# Patient Record
Sex: Female | Born: 1940 | Race: White | Hispanic: No | Smoking: Former smoker
Health system: Southern US, Community
[De-identification: ages and names within clinical notes are randomized; demographics above are authoritative.]

## PROBLEM LIST (undated history)

## (undated) DIAGNOSIS — I480 Paroxysmal atrial fibrillation: Secondary | ICD-10-CM

## (undated) DIAGNOSIS — I10 Essential (primary) hypertension: Secondary | ICD-10-CM

## (undated) DIAGNOSIS — J449 Chronic obstructive pulmonary disease, unspecified: Secondary | ICD-10-CM

## (undated) DIAGNOSIS — N179 Acute kidney failure, unspecified: Secondary | ICD-10-CM

## (undated) DIAGNOSIS — I4892 Unspecified atrial flutter: Secondary | ICD-10-CM

## (undated) DIAGNOSIS — IMO0001 Reserved for inherently not codable concepts without codable children: Secondary | ICD-10-CM

## (undated) DIAGNOSIS — E119 Type 2 diabetes mellitus without complications: Secondary | ICD-10-CM

## (undated) DIAGNOSIS — E079 Disorder of thyroid, unspecified: Secondary | ICD-10-CM

## (undated) DIAGNOSIS — L309 Dermatitis, unspecified: Secondary | ICD-10-CM

## (undated) DIAGNOSIS — E785 Hyperlipidemia, unspecified: Secondary | ICD-10-CM

## (undated) DIAGNOSIS — N189 Chronic kidney disease, unspecified: Secondary | ICD-10-CM

## (undated) DIAGNOSIS — M1990 Unspecified osteoarthritis, unspecified site: Secondary | ICD-10-CM

## (undated) DIAGNOSIS — H269 Unspecified cataract: Secondary | ICD-10-CM

## (undated) DIAGNOSIS — K589 Irritable bowel syndrome without diarrhea: Secondary | ICD-10-CM

## (undated) DIAGNOSIS — H3530 Unspecified macular degeneration: Secondary | ICD-10-CM

## (undated) DIAGNOSIS — K219 Gastro-esophageal reflux disease without esophagitis: Secondary | ICD-10-CM

## (undated) DIAGNOSIS — C801 Malignant (primary) neoplasm, unspecified: Secondary | ICD-10-CM

## (undated) DIAGNOSIS — I4891 Unspecified atrial fibrillation: Secondary | ICD-10-CM

## (undated) DIAGNOSIS — Z794 Long term (current) use of insulin: Secondary | ICD-10-CM

## (undated) DIAGNOSIS — J45909 Unspecified asthma, uncomplicated: Secondary | ICD-10-CM

## (undated) DIAGNOSIS — Z5189 Encounter for other specified aftercare: Secondary | ICD-10-CM

## (undated) HISTORY — DX: Unspecified cataract: H26.9

## (undated) HISTORY — DX: Encounter for other specified aftercare: Z51.89

## (undated) HISTORY — DX: Essential (primary) hypertension: I10

## (undated) HISTORY — DX: Irritable bowel syndrome, unspecified: K58.9

## (undated) HISTORY — DX: Unspecified atrial flutter: I48.92

## (undated) HISTORY — DX: Disorder of thyroid, unspecified: E07.9

## (undated) HISTORY — DX: Long term (current) use of insulin: Z79.4

## (undated) HISTORY — DX: Paroxysmal atrial fibrillation: I48.0

## (undated) HISTORY — DX: Unspecified osteoarthritis, unspecified site: M19.90

## (undated) HISTORY — DX: Dermatitis, unspecified: L30.9

## (undated) HISTORY — DX: Unspecified atrial fibrillation: I48.91

## (undated) HISTORY — DX: Gastro-esophageal reflux disease without esophagitis: K21.9

## (undated) HISTORY — DX: Acute kidney failure, unspecified: N17.9

## (undated) HISTORY — DX: Chronic kidney disease, unspecified: N18.9

## (undated) HISTORY — DX: Hyperlipidemia, unspecified: E78.5

## (undated) HISTORY — DX: Malignant (primary) neoplasm, unspecified: C80.1

## (undated) HISTORY — DX: Type 2 diabetes mellitus without complications: E11.9

## (undated) HISTORY — DX: Unspecified macular degeneration: H35.30

## (undated) HISTORY — DX: Reserved for inherently not codable concepts without codable children: IMO0001

## (undated) HISTORY — DX: Unspecified asthma, uncomplicated: J45.909

## (undated) HISTORY — DX: Chronic obstructive pulmonary disease, unspecified: J44.9

---

## 1969-09-13 HISTORY — PX: ABDOMINAL HYSTERECTOMY: SHX81

## 1969-09-13 HISTORY — PX: APPENDECTOMY: SHX54

## 1989-05-14 HISTORY — PX: HEMORRHOID SURGERY: SHX153

## 1989-05-14 HISTORY — PX: TRIGGER FINGER RELEASE: SHX641

## 2000-05-13 ENCOUNTER — Ambulatory Visit (HOSPITAL_COMMUNITY): Admission: RE | Admit: 2000-05-13 | Discharge: 2000-05-13 | Payer: Self-pay | Admitting: General Surgery

## 2000-05-13 ENCOUNTER — Encounter (INDEPENDENT_AMBULATORY_CARE_PROVIDER_SITE_OTHER): Payer: Self-pay

## 2000-07-11 ENCOUNTER — Ambulatory Visit (HOSPITAL_COMMUNITY): Admission: RE | Admit: 2000-07-11 | Discharge: 2000-07-11 | Payer: Self-pay | Admitting: Family Medicine

## 2000-07-11 ENCOUNTER — Encounter: Payer: Self-pay | Admitting: Family Medicine

## 2000-07-18 ENCOUNTER — Encounter: Payer: Self-pay | Admitting: Family Medicine

## 2000-07-18 ENCOUNTER — Encounter: Admission: RE | Admit: 2000-07-18 | Discharge: 2000-07-18 | Payer: Self-pay | Admitting: Family Medicine

## 2000-09-13 HISTORY — PX: TOTAL HIP ARTHROPLASTY: SHX124

## 2000-09-13 HISTORY — PX: CATARACT EXTRACTION: SUR2

## 2001-08-24 ENCOUNTER — Encounter: Payer: Self-pay | Admitting: Family Medicine

## 2001-08-24 ENCOUNTER — Encounter: Admission: RE | Admit: 2001-08-24 | Discharge: 2001-08-24 | Payer: Self-pay | Admitting: Family Medicine

## 2002-06-12 ENCOUNTER — Emergency Department (HOSPITAL_COMMUNITY): Admission: EM | Admit: 2002-06-12 | Discharge: 2002-06-12 | Payer: Self-pay | Admitting: Emergency Medicine

## 2002-06-28 ENCOUNTER — Encounter: Admission: RE | Admit: 2002-06-28 | Discharge: 2002-06-28 | Payer: Self-pay | Admitting: Family Medicine

## 2002-06-28 ENCOUNTER — Encounter: Payer: Self-pay | Admitting: Family Medicine

## 2002-07-10 ENCOUNTER — Encounter: Payer: Self-pay | Admitting: Family Medicine

## 2002-07-10 ENCOUNTER — Encounter: Admission: RE | Admit: 2002-07-10 | Discharge: 2002-07-10 | Payer: Self-pay | Admitting: Family Medicine

## 2002-07-20 ENCOUNTER — Encounter: Admission: RE | Admit: 2002-07-20 | Discharge: 2002-07-20 | Payer: Self-pay | Admitting: Family Medicine

## 2002-07-20 ENCOUNTER — Encounter: Payer: Self-pay | Admitting: Family Medicine

## 2002-08-07 ENCOUNTER — Encounter: Payer: Self-pay | Admitting: Family Medicine

## 2002-08-07 ENCOUNTER — Encounter: Admission: RE | Admit: 2002-08-07 | Discharge: 2002-08-07 | Payer: Self-pay | Admitting: Family Medicine

## 2002-10-29 ENCOUNTER — Encounter: Admission: RE | Admit: 2002-10-29 | Discharge: 2002-10-29 | Payer: Self-pay | Admitting: Family Medicine

## 2002-10-29 ENCOUNTER — Encounter: Payer: Self-pay | Admitting: Family Medicine

## 2002-11-02 ENCOUNTER — Encounter: Admission: RE | Admit: 2002-11-02 | Discharge: 2002-11-02 | Payer: Self-pay | Admitting: Family Medicine

## 2002-11-02 ENCOUNTER — Encounter: Payer: Self-pay | Admitting: Family Medicine

## 2003-01-29 ENCOUNTER — Encounter: Admission: RE | Admit: 2003-01-29 | Discharge: 2003-01-29 | Payer: Self-pay | Admitting: Family Medicine

## 2003-01-29 ENCOUNTER — Encounter: Payer: Self-pay | Admitting: Family Medicine

## 2003-07-18 ENCOUNTER — Encounter: Payer: Self-pay | Admitting: Internal Medicine

## 2004-08-13 ENCOUNTER — Encounter: Admission: RE | Admit: 2004-08-13 | Discharge: 2004-08-13 | Payer: Self-pay | Admitting: Family Medicine

## 2004-08-19 ENCOUNTER — Encounter: Admission: RE | Admit: 2004-08-19 | Discharge: 2004-11-17 | Payer: Self-pay | Admitting: Family Medicine

## 2005-09-13 DIAGNOSIS — I480 Paroxysmal atrial fibrillation: Secondary | ICD-10-CM

## 2005-09-13 HISTORY — DX: Paroxysmal atrial fibrillation: I48.0

## 2006-05-13 ENCOUNTER — Encounter: Admission: RE | Admit: 2006-05-13 | Discharge: 2006-05-13 | Payer: Self-pay | Admitting: Family Medicine

## 2006-05-28 ENCOUNTER — Emergency Department (HOSPITAL_COMMUNITY): Admission: EM | Admit: 2006-05-28 | Discharge: 2006-05-28 | Payer: Self-pay | Admitting: Emergency Medicine

## 2006-05-31 ENCOUNTER — Encounter: Admission: RE | Admit: 2006-05-31 | Discharge: 2006-05-31 | Payer: Self-pay | Admitting: Family Medicine

## 2006-06-15 ENCOUNTER — Ambulatory Visit: Payer: Self-pay | Admitting: Cardiology

## 2006-06-28 ENCOUNTER — Ambulatory Visit: Payer: Self-pay

## 2006-06-28 ENCOUNTER — Encounter: Payer: Self-pay | Admitting: Cardiology

## 2006-12-01 ENCOUNTER — Encounter: Admission: RE | Admit: 2006-12-01 | Discharge: 2006-12-01 | Payer: Self-pay | Admitting: Family Medicine

## 2006-12-12 HISTORY — PX: OTHER SURGICAL HISTORY: SHX169

## 2007-01-31 ENCOUNTER — Encounter: Admission: RE | Admit: 2007-01-31 | Discharge: 2007-01-31 | Payer: Self-pay | Admitting: Family Medicine

## 2007-02-13 ENCOUNTER — Encounter: Admission: RE | Admit: 2007-02-13 | Discharge: 2007-02-13 | Payer: Self-pay | Admitting: Family Medicine

## 2007-04-10 ENCOUNTER — Encounter: Admission: RE | Admit: 2007-04-10 | Discharge: 2007-04-10 | Payer: Self-pay | Admitting: Family Medicine

## 2007-05-11 ENCOUNTER — Encounter: Admission: RE | Admit: 2007-05-11 | Discharge: 2007-05-11 | Payer: Self-pay | Admitting: Family Medicine

## 2007-09-05 ENCOUNTER — Ambulatory Visit: Payer: Self-pay | Admitting: Internal Medicine

## 2007-09-26 ENCOUNTER — Ambulatory Visit: Payer: Self-pay | Admitting: Internal Medicine

## 2008-03-26 ENCOUNTER — Encounter: Payer: Self-pay | Admitting: Internal Medicine

## 2008-05-06 ENCOUNTER — Ambulatory Visit: Payer: Self-pay | Admitting: Internal Medicine

## 2008-05-06 DIAGNOSIS — R197 Diarrhea, unspecified: Secondary | ICD-10-CM | POA: Insufficient documentation

## 2008-05-06 DIAGNOSIS — K219 Gastro-esophageal reflux disease without esophagitis: Secondary | ICD-10-CM | POA: Insufficient documentation

## 2008-05-13 ENCOUNTER — Encounter: Admission: RE | Admit: 2008-05-13 | Discharge: 2008-05-13 | Payer: Self-pay | Admitting: Internal Medicine

## 2008-05-14 ENCOUNTER — Telehealth: Payer: Self-pay | Admitting: Internal Medicine

## 2008-05-15 ENCOUNTER — Ambulatory Visit (HOSPITAL_BASED_OUTPATIENT_CLINIC_OR_DEPARTMENT_OTHER): Admission: RE | Admit: 2008-05-15 | Discharge: 2008-05-15 | Payer: Self-pay | Admitting: *Deleted

## 2008-05-28 ENCOUNTER — Telehealth: Payer: Self-pay | Admitting: Internal Medicine

## 2008-05-31 ENCOUNTER — Encounter: Payer: Self-pay | Admitting: Physician Assistant

## 2008-05-31 ENCOUNTER — Ambulatory Visit: Payer: Self-pay | Admitting: Internal Medicine

## 2008-05-31 DIAGNOSIS — K5730 Diverticulosis of large intestine without perforation or abscess without bleeding: Secondary | ICD-10-CM | POA: Insufficient documentation

## 2008-05-31 LAB — CONVERTED CEMR LAB
Sed Rate: 29 mm/hr — ABNORMAL HIGH (ref 0–22)
Tissue Transglutaminase Ab, IgA: 0.3 units (ref <7)

## 2008-06-05 ENCOUNTER — Telehealth: Payer: Self-pay | Admitting: Internal Medicine

## 2008-06-17 ENCOUNTER — Telehealth: Payer: Self-pay | Admitting: Internal Medicine

## 2008-06-17 ENCOUNTER — Ambulatory Visit: Payer: Self-pay | Admitting: Internal Medicine

## 2008-06-17 DIAGNOSIS — R1013 Epigastric pain: Secondary | ICD-10-CM

## 2008-06-17 DIAGNOSIS — Z8719 Personal history of other diseases of the digestive system: Secondary | ICD-10-CM | POA: Insufficient documentation

## 2008-06-17 DIAGNOSIS — K589 Irritable bowel syndrome without diarrhea: Secondary | ICD-10-CM | POA: Insufficient documentation

## 2008-06-26 ENCOUNTER — Ambulatory Visit (HOSPITAL_COMMUNITY): Admission: RE | Admit: 2008-06-26 | Discharge: 2008-06-26 | Payer: Self-pay | Admitting: Internal Medicine

## 2008-06-28 ENCOUNTER — Telehealth: Payer: Self-pay | Admitting: Internal Medicine

## 2009-02-13 ENCOUNTER — Encounter: Admission: RE | Admit: 2009-02-13 | Discharge: 2009-02-13 | Payer: Self-pay | Admitting: Gastroenterology

## 2009-07-28 ENCOUNTER — Inpatient Hospital Stay (HOSPITAL_COMMUNITY): Admission: RE | Admit: 2009-07-28 | Discharge: 2009-07-30 | Payer: Self-pay | Admitting: Orthopedic Surgery

## 2009-08-01 ENCOUNTER — Emergency Department (HOSPITAL_COMMUNITY): Admission: EM | Admit: 2009-08-01 | Discharge: 2009-08-02 | Payer: Self-pay | Admitting: Emergency Medicine

## 2009-09-13 HISTORY — PX: CHOLECYSTECTOMY, LAPAROSCOPIC: SHX56

## 2009-09-30 ENCOUNTER — Encounter: Admission: RE | Admit: 2009-09-30 | Discharge: 2009-09-30 | Payer: Self-pay | Admitting: Internal Medicine

## 2009-11-10 ENCOUNTER — Encounter: Admission: RE | Admit: 2009-11-10 | Discharge: 2009-11-10 | Payer: Self-pay | Admitting: Internal Medicine

## 2010-03-30 ENCOUNTER — Ambulatory Visit (HOSPITAL_COMMUNITY): Admission: RE | Admit: 2010-03-30 | Discharge: 2010-03-30 | Payer: Self-pay | Admitting: Surgery

## 2010-09-13 HISTORY — PX: TOTAL HIP ARTHROPLASTY: SHX124

## 2010-10-13 NOTE — Progress Notes (Signed)
Summary: Surgical Referral   Phone Note Outgoing Call Call back at Work Phone [phone]   Call placed by: Teresita Madura CMA,  June 28, 2008 10:33 AM Call placed to: Patient Summary of Call: Left message on pt voice mail @ work to call office. Pt needs to have gallbladder removed, see if she has GSu, if not call Sean @ [facility] [phone].  Follow-up for Phone Call        Scheduled [facility] appt Dr. Manus Rudd Tues 07-16-08 @ 9:50, arrive 9:20.  Teresita Madura CMA  June 28, 2008 10:45 AM  Additional Follow-up for Phone Call Additional follow up Details #1::        Spoke with patient, she declines surgical referral.  She states she knew she has gallstones and that is not her problem.  She wants a second opinion.  Advised her that the gallbladder is also diseased and may be the cause of her pain.  She instructed me to cx appt with [facility] and she wants her primary care MD to review [place] report.  Biscomed to Dr Concepcion Elk.  Will Cx appt with [facility]. Additional Follow-up by: Darcey Nora RN,  July 01, 2008 8:33 AM    Additional Follow-up for Phone Call Additional follow up Details #2::    Let her know I think it is one of her problems but not ll. Happy to see her in office to review if desired. Follow-up by: Iva Boop MD,  July 01, 2008 10:04 AM  Additional Follow-up for Phone Call Additional follow up Details #3:: Details for Additional Follow-up Action Taken: pt notified, she will follow up as needed Additional Follow-up by: Darcey Nora RN,  July 01, 2008 10:05 AM

## 2010-10-13 NOTE — Consult Note (Signed)
Summary: Jeanette Mcintosh & screening for colon CA/Edwin Avbuere,MD  Jeanette Mcintosh & screening for colon CA/Edwin Avbuere,MD   Imported By: Lester [place] 05/22/2008 10:04:35  _____________________________________________________________________  External Attachment:    Type:   Image     Comment:   External Document

## 2010-10-13 NOTE — Progress Notes (Signed)
Summary: RX NOT WORKING/.//NEEDS SAMPLES    Phone Note Call from Patient Call back at Home Phone [phone]   Caller: Patient Call For: [name] Reason for Call: Talk to Nurse Summary of Call: NEEDS PRIOR AUTH ON HER XIFAXAN WOULD LIKE SOME SAMPLES...PROTONIX NOT WORKING FOR HER STATES THAT SHE HAS DIARRHEA AGAIN Initial call taken by: Tawni Levy,  June 05, 2008 3:19 PM  Follow-up for Phone Call        pt unable to afford the rest of her xifaxan perscription, she took the samples that were provided, but doesn't feel it helped.  Having diarrhea off and on while on the protonix declines alternate PPI for now.  She states she will keep REV with Dr Leone Payor in 2 weeks, she will continue on her imodium as she says it helps.  She is to call back if wants alternate PPI untill appt with Dr Leone Payor. Follow-up by: Darcey Nora RN,  June 05, 2008 4:09 PM

## 2010-10-13 NOTE — Procedures (Signed)
Summary: Colonoscopy   Colonoscopy  Procedure date:  07/18/2003  Findings:      Results: Hemorrhoids.     Results: Diverticulosis.       Location:  [facility].   Patient Name: Jeanette Mcintosh, Jeanette Mcintosh MRN:  Procedure Procedures: Colonoscopy CPT: [phone].  Personnel: Endoscopist: Iva Boop, MD, [facility].  Referred By: Mosetta Putt, M.D.  Exam Location: Exam performed in Outpatient Clinic. Outpatient  Patient Consent: Procedure, Alternatives, Risks and Benefits discussed, consent obtained, from patient. Consent was obtained by the RN.  Indications  Average Risk Screening Routine.  History  Current Medications: Patient is not currently taking Coumadin.  Pre-Exam Physical: Performed Jul 18, 2003. Cardio-pulmonary exam, Rectal exam, HEENT exam , Abdominal exam, Mental status exam WNL.  Exam Exam: Extent of exam reached: Cecum, extent intended: Cecum.  The cecum was identified by appendiceal orifice and IC valve. Patient position: on left side. Colon retroflexion performed. Images taken. ASA Classification: I. Tolerance: fair, adequate exam.  Monitoring: Pulse and BP monitoring, Oximetry used. Supplemental O2 given.  Colon Prep Used Golytely for colon prep. Prep results: excellent.  Sedation Meds: Patient assessed and found to be appropriate for moderate (conscious) sedation. Sedation was managed by the Endoscopist. Fentanyl 100 mcg. given IV. Versed 10 mg. given IV.  Findings - NORMAL EXAM: Cecum to Descending Colon.  DIVERTICULOSIS: Sigmoid Colon. Not bleeding. ICD9: Diverticulosis, Colon: 562.10. Comments: moderate, with angulation.  HEMORRHOIDS: External. Size: Grade II. Not bleeding. ICD9: Hemorrhoids, External: 455.3.   Assessment Abnormal examination, see findings above.  Diagnoses: 562.10: Diverticulosis, Colon.  455.3: Hemorrhoids, External.   Comments: No polyps seen.  Events  Unplanned Interventions: No intervention was required.   Plans Patient Education: Patient given standard instructions for: Hemorrhoids. Constipation. Disposition: After procedure patient sent to recovery. After recovery patient sent home.  Scheduling/Referral: Primary Care Provider, to Mosetta Putt, M.D., as planned and to coordinate future colon cancer screening.,   CC:   Mosetta Putt, MD  This report was created from the original endoscopy report, which was reviewed and signed by the above listed endoscopist.

## 2010-10-13 NOTE — Progress Notes (Signed)
Summary: TRIAGE   Phone Note Call from Patient Call back at Home Phone [phone] Call back at c# [phone]   Caller: Patient Call For: GESSNER Reason for Call: Talk to Nurse Details for Reason: TRIAGE Summary of Call: meds have been changed having bad heartburn Initial call taken by: Guadlupe [facility],  May 28, 2008 11:14 AM  Follow-up for Phone Call        pt using zantac not helping at all , having diarrhea even with the zantac,  no relief having to use TUMS.  States has lost 8 lbs.  Please advise.    Follow-up by: Darcey Nora RN,  May 28, 2008 11:31 AM  Additional Follow-up for Phone Call Additional follow up Details #1::        needs diarrhea eval would have her see an extender re: this not convinced diarrhea from PPI Additional Follow-up by: Iva Boop MD,  May 28, 2008 4:44 PM    Additional Follow-up for Phone Call Additional follow up Details #2::    pt given appt for 05-31-08 1:30 with Amy Esterwood. Follow-up by: Darcey Nora RN,  May 29, 2008 9:33 AM

## 2010-10-13 NOTE — Progress Notes (Signed)
Summary: rx called in    Phone Note Call from Patient Call back at Home Phone [phone]   Caller: Patient Call For: gessner Reason for Call: Talk to Nurse Summary of Call: pt want to let nurse know that she needs rx on protonix called in thru [facility] pharmacy for three months supply  Initial call taken by: Tawni Levy,  June 17, 2008 12:44 PM  Follow-up for Phone Call        Rx was sent to pts pharmacy. Pt notified.  Follow-up by: Christie Nottingham CMA,  June 17, 2008 1:36 PM      Prescriptions: PROTONIX 40 MG TBEC (PANTOPRAZOLE SODIUM) one tablet by mouth once daily  #90 x 3   Entered by:   Christie Nottingham CMA   Authorized by:   Iva Boop MD   Signed by:   Christie Nottingham CMA on 06/17/2008   Method used:   Printed then faxed to ...       [facility] (mail-order)       [address]       [place], [place]  [address]       Ph: [phone]       Fax: [phone]   RxID:   [id number]   Appended Document: rx called in  Samples of protonix that were put at front desk for patient pick up have been added back to sample closet. Patient never came to pick these up.

## 2010-10-13 NOTE — Assessment & Plan Note (Signed)
Summary: diarrhea / Jeanette Mcintosh    History of Present Illness Visit Type: follow up Primary GI MD: Jeanette Head MD [facility] Primary Provider: Fleet Contras, MD Requesting Provider: Milford Cage, MD Chief Complaint: diarrhea x 6 months  History of Present Illness:   70 YO WF KNOWN TO DR Leone Payor WHO HAS BEEN SEEN FOR GERD AND DIARRHEA  RECENTLY. IT WAS FELT HER DIARRHEA WAS MED INDUCED PROBABLY PPI ASSOCIATED. SXS WORSE WITH ZEGERID  AND PRILOSEC. BOTH OF THESE MEDS DO CONTROL HER REFLUX HOWEVER. NOW ON ZANTAC 300 two times a day FOR THE PAST SEVERAL WEEKS AND SAYS THE REFLUX IS HORRIBLE AND THAT SHE IS STILL HAVING DIARRHEA, SOME DAYS MUCH WORSE THAN OTHERS. SHE HAS URGENCY AND SOFT TO VERY LOOSE STOOLS,IMMODIUM HELPS BUT THEN SHE GETS CONSTIPATED!Marland KitchenDENIES PAIN ,SOME GAS AND BLOATING. HER LAST COLONOSCOPY WAS IN 2004,DIVERTICULOSIS.   GI Review of Systems    Reports abdominal pain, acid reflux, and  heartburn.     Location of  Abdominal pain: lower abdomen.    Denies belching, bloating, chest pain, dysphagia with liquids, dysphagia with solids, loss of appetite, nausea, vomiting, vomiting blood, weight loss, and  weight gain.      Reports diarrhea and  rectal bleeding.     Denies anal fissure, black tarry stools, change in bowel habit, constipation, diverticulosis, fecal incontinence, heme positive stool, hemorrhoids, irritable bowel syndrome, jaundice, light color stool, liver problems, and  rectal pain.     Prior Medications Reviewed Using: List Brought by Patient  Updated Prior Medication List: BUFFERED ASPIRIN 325 MG  TABS (ASPIRIN BUF(CACARB-MGCARB-MGO)) Take 1 tablet by mouth once a day B COMPLEX   TABS (B COMPLEX VITAMINS) Take 1 tablet by mouth once a day VITAMIN B-12 1000 MCG  TABS (CYANOCOBALAMIN) Take 1 tablet by mouth once a day CLONAZEPAM 1 MG  TABS (CLONAZEPAM) Take 1 tab by mouth at bedtime COZAAR 100 MG  TABS (LOSARTAN POTASSIUM) Take 1 tablet by mouth once a day VITAMIN D 1000  UNIT  TABS (CHOLECALCIFEROL) Take 1 tablet by mouth once a day FLEXERIL 10 MG  TABS (CYCLOBENZAPRINE HCL) Take 1 tablet by mouth once a day FOLIC ACID 400 MCG  TABS (FOLIC ACID) Take 1 tablet by mouth once a day GABAPENTIN 600 MG TABS (GABAPENTIN) Take 1 1/2 tab daily MELOXICAM 7.5 MG  TABS (MELOXICAM) Take 1 tab by mouth at bedtime TOPROL XL 100 MG  XR24H-TAB (METOPROLOL SUCCINATE) Take 1 tablet by mouth once a day ZANTAC 300 MG TABS (RANITIDINE HCL) Take 1 tablet by mouth two times a day AFRIN NASAL SPRAY 0.05 %  SOLN (OXYMETAZOLINE HCL) 2 squirts at bedtime as needed HYDROCODONE-ACETAMINOPHEN 5-500 MG  TABS (HYDROCODONE-ACETAMINOPHEN) Take 1 tab by mouth at bedtime as needed RUTIN 500 MG  TABS (RUTIN) Take 1 tablet by mouth once a day SYNTHROID 137 MCG  TABS (LEVOTHYROXINE SODIUM) Take 1 tablet by mouth once a day ZOLPIDEM TARTRATE 10 MG  TABS (ZOLPIDEM TARTRATE) Take 1 tab by mouth at bedtime CHLORTHALIDONE 25 MG TABS (CHLORTHALIDONE) Take 1/2 every am PRAVASTATIN SODIUM 10 MG TABS (PRAVASTATIN SODIUM) at bedtime DILTIAZEM HCL CR 120 MG XR12H-CAP (DILTIAZEM HCL) 1 once daily LANTUS 100 UNIT/ML SOLN (INSULIN GLARGINE) 30 cc 1 once daily ZYRTEC ALLERGY 10 MG TABS (CETIRIZINE HCL) 1 once daily IMODIUM A-D 2 MG TABS (LOPERAMIDE HCL) as needed  Current Allergies (reviewed today): ERYTHROMYCIN * IVP DYE  Past Medical History:    Reviewed history from 04/19/2008 and no changes required:  Cervical Cancer 1970       Macular Degeneration, dry       OsteoArthritis       History of Atrial Fibrillation       Diabetes Type 2       Diverticulosis Sigmoid       Gallstones       Hypertension       Hypothyroidism  Past Surgical History:    Reviewed history from 04/19/2008 and no changes required:       Appendectomy       Hemorrhoidectomy       Hip Replacement       Hysterectomy   Family History:    Reviewed history from 05/06/2008 and no changes required:       Parkinson's  disease: Father       Suicide: Mother       Macular degeration: Siblings  Social History:    Reviewed history from 05/06/2008 and no changes required:       Occupation: Retired       Patient is a former smoker.        Alcohol Use - yes - 1 daily       Illicit Drug Use - no       Patient does not get regular exercise.     Review of Systems       The patient complains of urine leakage.         SHE HAS PERIPHERALNEUROPATHY,OTHERWISE ROS NEGATIVE EXCEPT GI AS ABOVE.   Vital Signs:  Patient Profile:   70 Years Old Female Height:     64 inches Weight:      168.50 pounds BMI:     29.03 Pulse rate:   72 / minute Pulse rhythm:   regular BP sitting:   112 / 64  (left arm)  Vitals Entered By: Chales Abrahams CMA (May 31, 2008 1:50 PM)                  Physical Exam  General:     Well developed, well nourished, no acute distress. Mcintosh:     Normocephalic and atraumatic. Neck:     Supple; no masses or thyromegaly. Lungs:     Clear throughout to auscultation. Heart:     Regular rate and rhythm; no murmurs, rubs,  or bruits. Abdomen:     Soft, nontender and nondistended. No masses, hepatosplenomegaly or hernias noted. Normal bowel sounds. Rectal:     not done    Impression & Recommendations:  Problem # 1:  DIARRHEA (ICD-787.91) PERSISTENT DIARRHEA,POSSIBLY MED RELATED VS MULTIFACTORIAL ,R/OBACTERIAL OVERGROWTH COMPONENT,DIABETIC VISCERAL NEUROPATHY,MICROSCOPIC COLITIS,CELIAC DISEASE Orders: TLB-Sedimentation Rate (ESR) ([id number]-[id number]) T-Sprue Panel (Celiac Disease Aby Eval) ([id number]) TRIAL OF PROTONIX 40 MG DAILY,SAMPLES GIVEN, IF THIS WORSENS DIARRHEA WOULD CONTINUE TRIALS OF OTHER PPI'S. CONTINUE DAILY PROBIOTIC/ACIDOPHILLUS XIFAXIN 200 MG 3X DAILY X 10 DAYS FOLLOW UP WITH DR Leone Payor IN 2-3 WEEKS,IF NO IMPROVEMENT WOULD REPEAT COLONOSCOPY/RANDOM BXS.   Problem # 2:  GERD (ICD-530.81) SEE ABOVE PLANS   Patient Instructions: 1)  Get labs drawn today.    2)  Pick up Rx's at your pharmacy. 3)  Call back in a couple of weeks for more Protonix samples. 4)  F/u appt with Dr. Leone Payor in 2 wks. 5)  Copy Sent To: Stevenson Clinch, MD    Prescriptions: PROTONIX 40 MG TBEC (PANTOPRAZOLE SODIUM) one tablet by mouth once daily  #30 x 11   Entered by:   Christie Nottingham CMA  Authorized by:   Sammuel Cooper PA-c   Signed by:   Christie Nottingham CMA on 05/31/2008   Method used:   [facility]ron[facility] to        [facility]  [address] [phone]* (retail)       [address]       [place], [place]  [address]       Ph: [phone] or [phone]       Fax: [phone]   RxID:   [id number] XIFAXAN 200 MG TABS (RIFAXIMIN) one tablet by mouth three times a day  #18 x 0   Entered by:   Christie Nottingham CMA   Authorized by:   Sammuel Cooper PA-c   Signed by:   Christie Nottingham CMA on 05/31/2008   Method used:   [facility]        [facility]  [address] [phone]* (retail)       [address]       [place], [place]  [address]       Ph: [phone] or [phone]       Fax: [phone]   RxID:   [id number]  ]

## 2010-10-13 NOTE — Progress Notes (Signed)
Summary: meds not working  Phone Note Call from Patient Call back at [employer] [phone]   Caller: Patient Call For: gessner Reason for Call: Talk to Nurse Summary of Call: pt states that dr switched her meds from prilosec to zantac b/c prilosec was giving her diarrhea but she states that zantac helps her diarrhea but does nothing for her stomach Initial call taken by: Tawni Levy,  May 14, 2008 10:41 AM  Follow-up for Phone Call        RC to pt at home number.  this number is blocked.  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 Follow-up by: Francee Piccolo CMA,  May 15, 2008 10:35 AM  Additional Follow-up for Phone Call Additional follow up Details #1::        Pt had OV with Amy Esterwood on 05/31/08.  Ending follow up. Additional Follow-up by: Francee Piccolo CMA,  June 04, 2008 9:42 AM

## 2010-10-13 NOTE — Assessment & Plan Note (Signed)
Summary: GERD/FH   History of Present Illness Visit Type: consult Primary GI MD: Stan Head MD [facility] Primary Provider: Fleet Contras, MD Requesting Provider: Milford Cage, MD Chief Complaint: acid reflux & diarrhea History of Present Illness:   Post-prandial diarrhea. Started a couple of years ago. she stopped metformin and then it helped, then needed insulin. Calcium and magnesium were stopped, and she then also switched MD's. Was eating cream of wheat and oatmeal and stopped PPI (Zegerid to Prilosec due to Medicare part D). She says her stomach is fine, but if bends over reflux occurs, no matter what diet. Almost no diarrhea off Prilosec, then went back to Prilosec once daily, helped reflux alot, also then retried Zegerid. Both of them caused diarrhea, and Zegerid was worse. Takes gabapentin for neuropathy in her feet. Has stopped metaclopramide due to diarrhea.              Prior Medications Reviewed Using: List Brought by Patient  Updated Prior Medication List: BUFFERED ASPIRIN 325 MG  TABS (ASPIRIN BUF(CACARB-MGCARB-MGO)) Take 1 tablet by mouth once a day B COMPLEX   TABS (B COMPLEX VITAMINS) Take 1 tablet by mouth once a day VITAMIN B-12 1000 MCG  TABS (CYANOCOBALAMIN) Take 1 tablet by mouth once a day CLONAZEPAM 1 MG  TABS (CLONAZEPAM) Take 1 tab by mouth at bedtime COZAAR 100 MG  TABS (LOSARTAN POTASSIUM) Take 1 tablet by mouth once a day VITAMIN D 1000 UNIT  TABS (CHOLECALCIFEROL) Take 1 tablet by mouth once a day FLEXERIL 10 MG  TABS (CYCLOBENZAPRINE HCL) Take 1 tablet by mouth once a day FOLIC ACID 400 MCG  TABS (FOLIC ACID) Take 1 tablet by mouth once a day HYLIRA 0.1 %  LOTN (HYALURONATE SODIUM (EMOLLIENT)) apply 2-3 times a day GABAPENTIN 600 MG TABS (GABAPENTIN) Take 1 1/2 tab daily MELOXICAM 7.5 MG  TABS (MELOXICAM) Take 1 tab by mouth at bedtime TOPROL XL 100 MG  XR24H-TAB (METOPROLOL SUCCINATE) Take 1 tablet by mouth once a day PRILOSEC 20 MG  CPDR  (OMEPRAZOLE) Take 1 tablet by mouth two times a day AFRIN NASAL SPRAY 0.05 %  SOLN (OXYMETAZOLINE HCL) 2 squirts at bedtime as needed HYDROCODONE-ACETAMINOPHEN 5-500 MG  TABS (HYDROCODONE-ACETAMINOPHEN) Take 1 tab by mouth at bedtime as needed RUTIN 500 MG  TABS (RUTIN) Take 1 tablet by mouth once a day SYNTHROID 137 MCG  TABS (LEVOTHYROXINE SODIUM) Take 1 tablet by mouth once a day ZOLPIDEM TARTRATE 10 MG  TABS (ZOLPIDEM TARTRATE) Take 1 tab by mouth at bedtime CHLORTHALIDONE 25 MG TABS (CHLORTHALIDONE) Take 1/2 every am PRAVASTATIN SODIUM 10 MG TABS (PRAVASTATIN SODIUM) at bedtime  Current Allergies (reviewed today): ERYTHROMYCIN * IVP DYE  Past Medical History:    Reviewed history from 04/19/2008 and no changes required:       Cervical Cancer 1970       Macular Degeneration, dry       OsteoArthritis       History of Atrial Fibrillation       Diabetes Type 2       Diverticulosis Sigmoid       Gallstones       Hypertension       Hypothyroidism  Past Surgical History:    Reviewed history from 04/19/2008 and no changes required:       Appendectomy       Hemorrhoidectomy       Hip Replacement       Hysterectomy   Family History:    Parkinson's  disease: Father    Suicide: Mother    Macular degeration: Siblings  Social History:    Occupation: Retired    Patient is a former smoker.     Alcohol Use - yes - 1 daily    Illicit Drug Use - no    Patient does not get regular exercise.    Risk Factors:  Tobacco use:  quit Drug use:  no Alcohol use:  yes Exercise:  no    Vital Signs:  Patient Profile:   70 Years Old Female Height:     64 inches Weight:      170.13 pounds BMI:     29.31 Pulse rate:   72 / minute Pulse rhythm:   regular BP sitting:   102 / 66  (right arm)  Vitals Entered By: June McMurray CMA (May 06, 2008 4:03 PM)                  Physical Exam  General:     Well developed, well nourished, no acute distress. Abdomen:     Soft,  nontender and nondistended. No masses, hepatosplenomegaly or hernias noted. Normal bowel sounds. No splash.    Impression & Recommendations:  Problem # 1:  GERD (ICD-530.81) Assessment: Comment Only PPI helps but omeprazole and its derivativescause diarrhea. ? if other PPI's do but she has $ issues re: medications. Will try ranitidine 300 mg two times a day to see if GERD control without diarrhea occurs. May need a gastric emptying study as it is quite possible she has gastroparesis, but has already had problems with metaclopramide and side-effect profile can be a problem. Domperidone could be an option, perhaps.  Problem # 2:  DIARRHEA (ICD-787.91) Assessment: Comment Only Based upon her history it appears to be related to PPI's, omeprazole, specificcaly (Zegerid and omeprazole, and Nexium). Has had Prevacid before but cannot recall if she got diarrhea.  Has been using calcium and immodium to help control this.  Problem # 3:  SPECIAL SCREENING FOR MALIGNANT NEOPLASMS COLON (ICD-V76.51) Assessment: Comment Only She had a colonoscopy 11/04 with diverticulosis and hemorrhoids. No polyps. Next routine colonoscopy due 07/2013.   Patient Instructions: 1)  Stop Prilosec and Zegerid 2)  Try Zantac (ranitidine) 300 mg two times a day  3)  If still having problems in 2-3 weeks call back 4)  Copy Sent To: Dr. Tyson Dense    Prescriptions: ZANTAC 300 MG TABS (RANITIDINE HCL) Take 1 tablet by mouth two times a day  #60 x prn   Entered by:   Francee Piccolo CMA   Authorized by:   Iva Boop MD   Signed by:   Francee Piccolo CMA on 05/06/2008   Method used:   [facility]  [address] [phone][facility] (retail)       [address]       [place], [place]  [address]       Ph: [phone] or [phone]       Fax: [phone]   RxID:   [id number]  ]

## 2010-10-13 NOTE — Assessment & Plan Note (Signed)
Summary: diarrhea f/u    History of Present Illness Visit Type: follow up Primary GI MD: Stan Head MD [facility] Primary Provider: Fleet Contras, MD Requesting Provider: Milford Cage, MD Chief Complaint: diarrhea History of Present Illness:   Has cycles of diarrhea, treats with loperamde and/or calcium, then gets constipated and it repeats. Will have some normal bowel movements. In general, variable pattern is thenorm for her. On Protonix has had loose, frequent bowel movements but not urgent or incontinent. thinks that Prilosec works better for [employer] than other PPI's.  Also describing epigastric pain that radiates up into chest and ears, has had off/on for years but she says it is increasing in frequency. Happens right when she swallows medications. Carbonated beverages and belching will rekieve and so will water. Heartburn spells can last a couple of days, says blood sugar goes up. 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             Prior Medications Reviewed Using: Patient Recall  Updated Prior Medication List: BUFFERED ASPIRIN 325 MG  TABS (ASPIRIN BUF(CACARB-MGCARB-MGO)) Take 1 tablet by mouth once a day B COMPLEX   TABS (B COMPLEX VITAMINS) Take 1 tablet by mouth once a day VITAMIN B-12 1000 MCG  TABS (CYANOCOBALAMIN) Take 1 tablet by mouth once a day CLONAZEPAM 1 MG  TABS (CLONAZEPAM) Take 1 tab by mouth at bedtime COZAAR 100 MG  TABS (LOSARTAN POTASSIUM) Take 1 tablet by mouth once a day VITAMIN D 1000 UNIT  TABS (CHOLECALCIFEROL) Take 1 tablet by mouth once a day FLEXERIL 10 MG  TABS (CYCLOBENZAPRINE HCL) Take 1 tablet by mouth once a day FOLIC ACID 400 MCG  TABS (FOLIC ACID) Take 1 tablet by mouth once a day GABAPENTIN 600 MG TABS (GABAPENTIN) Take 1 1/2 tab daily MELOXICAM 7.5 MG  TABS (MELOXICAM) Take 1  tab by mouth at bedtime TOPROL XL 100 MG  XR24H-TAB (METOPROLOL SUCCINATE) Take 1 tablet by mouth once a day ZANTAC 300 MG TABS (RANITIDINE HCL) Take 1 tablet by mouth two times a day AFRIN NASAL SPRAY 0.05 %  SOLN (OXYMETAZOLINE HCL) 2 squirts at bedtime as needed HYDROCODONE-ACETAMINOPHEN 5-500 MG  TABS (HYDROCODONE-ACETAMINOPHEN) Take 1 tab by mouth at bedtime as needed RUTIN 500 MG  TABS (RUTIN) Take 1 tablet by mouth once a day SYNTHROID 137 MCG  TABS (LEVOTHYROXINE SODIUM) Take 1 tablet by mouth once a day ZOLPIDEM TARTRATE 10 MG  TABS (ZOLPIDEM TARTRATE) Take 1 tab by mouth at bedtime CHLORTHALIDONE 25 MG TABS (CHLORTHALIDONE) Take 1/2 every am PRAVASTATIN SODIUM 10 MG TABS (PRAVASTATIN SODIUM) 1 tablet po at bedtime DILTIAZEM HCL CR 120 MG XR12H-CAP (DILTIAZEM HCL) 1 once daily LANTUS 100 UNIT/ML SOLN (INSULIN GLARGINE) 30 cc 1 once daily ZYRTEC ALLERGY 10 MG TABS (CETIRIZINE HCL) 1 once daily IMODIUM A-D 2 MG TABS (LOPERAMIDE HCL) as needed PROTONIX 40 MG TBEC (PANTOPRAZOLE SODIUM) one tablet by mouth once daily  Current Allergies (reviewed today): ERYTHROMYCIN * IVP DYE  Past Medical History:    Reviewed history from 04/19/2008 and no changes required:       Cervical Cancer 1970       Macular Degeneration, dry       OsteoArthritis       History of Atrial Fibrillation       Diabetes Type 2  Diverticulosis Sigmoid       Gallstones       Hypertension       Hypothyroidism  Past Surgical History:    Reviewed history from 04/19/2008 and no changes required:       Appendectomy       Hemorrhoidectomy       Hip Replacement       Hysterectomy   Social History:    Reviewed history from 05/06/2008 and no changes required:       Occupation: Retired       Patient is a former smoker.        Alcohol Use - yes - 1 daily       Illicit Drug Use - no       Patient does not get regular exercise.      Vital Signs:  Patient Profile:   70 Years Old Female Height:     64  inches Weight:      172.50 pounds BMI:     29.72 BSA:     1.84 Pulse rate:   72 / minute Pulse rhythm:   regular BP sitting:   102 / 62  (right arm)  Vitals Entered By: Milford Cage CMA (June 17, 2008 9:22 AM)                  Physical Exam  General:     overweight       Impression & Recommendations:  Problem # 1:  EPIGASTRIC PAIN (ICD-789.06) Assessment: New the etiology of this is not entirely clear to me. I do think the pain that starts in the epigastrium and radiates up into her chest area in the neck area could be related to gallstones. It could be also a functional disturbance like a spasm of gut structures. Doubt gastroparesis as no classic sxs, her neuropathy is minimal and Gabapentin also for insomnia, but keep neuropathic cause in mind. Consider trial of Levsin pending [place]. May need a [facility] appt, ? HIDA scan. Orders: Ultrasound Abdomen (UAS)   Problem # 2:  IRRITABLE BOWEL SYNDROME (ICD-564.1) Assessment: Comment Only Better but still having diarrhea prioblems with alternating bowel habits.  Try Align, ? try ? Levsin. Avoid magnesium as possible.  Problem # 3:  GERD (ICD-530.81) Assessment: Improved Better but not aymptomatic. ? if some is really gallbladder/stones. Hard to sort this out completely, ? some IBS/functional overlay. Try nocturnal antacid w/o magnesium (if can find one) for now. May need two times a day PPI but may also flare diarrhea.   Patient Instructions: 1)  Try an antacid without magnesium for evening antacid as needed 2)  Take Align 1 each day for IBS 3)  We will call results of utrasound   cc: Dr. Mosetta Putt ]

## 2010-11-28 LAB — GLUCOSE, CAPILLARY
Glucose-Capillary: 110 mg/dL — ABNORMAL HIGH (ref 70–99)
Glucose-Capillary: 126 mg/dL — ABNORMAL HIGH (ref 70–99)
Glucose-Capillary: 129 mg/dL — ABNORMAL HIGH (ref 70–99)

## 2010-11-29 LAB — COMPREHENSIVE METABOLIC PANEL
ALT: 24 U/L (ref 0–35)
AST: 27 U/L (ref 0–37)
Albumin: 4.1 g/dL (ref 3.5–5.2)
Alkaline Phosphatase: 81 U/L (ref 39–117)
BUN: 21 mg/dL (ref 6–23)
CO2: 29 mEq/L (ref 19–32)
Calcium: 9.5 mg/dL (ref 8.4–10.5)
Chloride: 98 mEq/L (ref 96–112)
Creatinine, Ser: 1.14 mg/dL (ref 0.4–1.2)
GFR calc Af Amer: 57 mL/min — ABNORMAL LOW (ref >60)
GFR calc non Af Amer: 47 mL/min — ABNORMAL LOW (ref >60)
Glucose, Bld: 111 mg/dL — ABNORMAL HIGH (ref 70–99)
Potassium: 3.6 mEq/L (ref 3.5–5.1)
Sodium: 135 mEq/L (ref 135–145)
Total Bilirubin: 0.9 mg/dL (ref 0.3–1.2)
Total Protein: 6.5 g/dL (ref 6.0–8.3)

## 2010-11-29 LAB — SURGICAL PCR SCREEN
MRSA, PCR: NEGATIVE
Staphylococcus aureus: NEGATIVE

## 2010-11-29 LAB — CBC
HCT: 37.9 % (ref 36.0–46.0)
Hemoglobin: 13 g/dL (ref 12.0–15.0)
MCH: 33.2 pg (ref 26.0–34.0)
MCHC: 34.4 g/dL (ref 30.0–36.0)
MCV: 96.4 fL (ref 78.0–100.0)
Platelets: 256 10*3/uL (ref 150–400)
RBC: 3.93 MIL/uL (ref 3.87–5.11)
RDW: 12.1 % (ref 11.5–15.5)
WBC: 7.7 10*3/uL (ref 4.0–10.5)

## 2010-12-16 LAB — DIFFERENTIAL
Basophils Absolute: 0 10*3/uL (ref 0.0–0.1)
Basophils Absolute: 0 10*3/uL (ref 0.0–0.1)
Basophils Relative: 1 % (ref 0–1)
Basophils Relative: 1 % (ref 0–1)
Eosinophils Absolute: 0.4 10*3/uL (ref 0.0–0.7)
Eosinophils Absolute: 0.6 10*3/uL (ref 0.0–0.7)
Eosinophils Relative: 7 % — ABNORMAL HIGH (ref 0–5)
Eosinophils Relative: 9 % — ABNORMAL HIGH (ref 0–5)
Lymphocytes Relative: 24 % (ref 12–46)
Lymphocytes Relative: 21 % (ref 12–46)
Lymphs Abs: 1.5 10*3/uL (ref 0.7–4.0)
Lymphs Abs: 1.4 10*3/uL (ref 0.7–4.0)
Monocytes Absolute: 0.7 10*3/uL (ref 0.1–1.0)
Monocytes Absolute: 0.5 10*3/uL (ref 0.1–1.0)
Monocytes Relative: 11 % (ref 3–12)
Monocytes Relative: 7 % (ref 3–12)
Neutro Abs: 3.5 10*3/uL (ref 1.7–7.7)
Neutro Abs: 4.3 10*3/uL (ref 1.7–7.7)
Neutrophils Relative %: 57 % (ref 43–77)
Neutrophils Relative %: 63 % (ref 43–77)

## 2010-12-16 LAB — BASIC METABOLIC PANEL
BUN: 11 mg/dL (ref 6–23)
BUN: 7 mg/dL (ref 6–23)
BUN: 8 mg/dL (ref 6–23)
CO2: 25 mEq/L (ref 19–32)
CO2: 26 mEq/L (ref 19–32)
CO2: 27 mEq/L (ref 19–32)
Calcium: 7.4 mg/dL — ABNORMAL LOW (ref 8.4–10.5)
Calcium: 7.9 mg/dL — ABNORMAL LOW (ref 8.4–10.5)
Calcium: 8 mg/dL — ABNORMAL LOW (ref 8.4–10.5)
Chloride: 104 mEq/L (ref 96–112)
Chloride: 106 mEq/L (ref 96–112)
Chloride: 99 mEq/L (ref 96–112)
Creatinine, Ser: 0.79 mg/dL (ref 0.4–1.2)
Creatinine, Ser: 0.9 mg/dL (ref 0.4–1.2)
Creatinine, Ser: 1.04 mg/dL (ref 0.4–1.2)
GFR calc Af Amer: >60 mL/min (ref >60)
GFR calc Af Amer: >60 mL/min (ref >60)
GFR calc Af Amer: >60 mL/min (ref >60)
GFR calc non Af Amer: 53 mL/min — ABNORMAL LOW (ref >60)
GFR calc non Af Amer: >60 mL/min (ref >60)
GFR calc non Af Amer: >60 mL/min (ref >60)
Glucose, Bld: 152 mg/dL — ABNORMAL HIGH (ref 70–99)
Glucose, Bld: 157 mg/dL — ABNORMAL HIGH (ref 70–99)
Glucose, Bld: 165 mg/dL — ABNORMAL HIGH (ref 70–99)
Potassium: 3.4 mEq/L — ABNORMAL LOW (ref 3.5–5.1)
Potassium: 3.6 mEq/L (ref 3.5–5.1)
Potassium: 4.2 mEq/L (ref 3.5–5.1)
Sodium: 130 mEq/L — ABNORMAL LOW (ref 135–145)
Sodium: 136 mEq/L (ref 135–145)
Sodium: 138 mEq/L (ref 135–145)

## 2010-12-16 LAB — GLUCOSE, CAPILLARY
Glucose-Capillary: 116 mg/dL — ABNORMAL HIGH (ref 70–99)
Glucose-Capillary: 133 mg/dL — ABNORMAL HIGH (ref 70–99)
Glucose-Capillary: 134 mg/dL — ABNORMAL HIGH (ref 70–99)
Glucose-Capillary: 144 mg/dL — ABNORMAL HIGH (ref 70–99)
Glucose-Capillary: 157 mg/dL — ABNORMAL HIGH (ref 70–99)
Glucose-Capillary: 169 mg/dL — ABNORMAL HIGH (ref 70–99)
Glucose-Capillary: 114 mg/dL — ABNORMAL HIGH (ref 70–99)
Glucose-Capillary: 129 mg/dL — ABNORMAL HIGH (ref 70–99)
Glucose-Capillary: 137 mg/dL — ABNORMAL HIGH (ref 70–99)

## 2010-12-16 LAB — CROSSMATCH
ABO/RH(D): O POS
Antibody Screen: NEGATIVE

## 2010-12-16 LAB — CBC
HCT: 24.9 % — ABNORMAL LOW (ref 36.0–46.0)
HCT: 25.5 % — ABNORMAL LOW (ref 36.0–46.0)
HCT: 26.1 % — ABNORMAL LOW (ref 36.0–46.0)
HCT: 27.8 % — ABNORMAL LOW (ref 36.0–46.0)
HCT: 29.3 % — ABNORMAL LOW (ref 36.0–46.0)
HCT: 19.8 % — ABNORMAL LOW (ref 36.0–46.0)
HCT: 38.2 % (ref 36.0–46.0)
Hemoglobin: 10.2 g/dL — ABNORMAL LOW (ref 12.0–15.0)
Hemoglobin: 8.7 g/dL — ABNORMAL LOW (ref 12.0–15.0)
Hemoglobin: 8.9 g/dL — ABNORMAL LOW (ref 12.0–15.0)
Hemoglobin: 9.3 g/dL — ABNORMAL LOW (ref 12.0–15.0)
Hemoglobin: 9.8 g/dL — ABNORMAL LOW (ref 12.0–15.0)
Hemoglobin: 13.1 g/dL (ref 12.0–15.0)
Hemoglobin: 6.9 g/dL — CL (ref 12.0–15.0)
MCHC: 34.7 g/dL (ref 30.0–36.0)
MCHC: 34.9 g/dL (ref 30.0–36.0)
MCHC: 34.9 g/dL (ref 30.0–36.0)
MCHC: 35 g/dL (ref 30.0–36.0)
MCHC: 35.4 g/dL (ref 30.0–36.0)
MCHC: 34.5 g/dL (ref 30.0–36.0)
MCHC: 34.8 g/dL (ref 30.0–36.0)
MCV: 92.1 fL (ref 78.0–100.0)
MCV: 92.9 fL (ref 78.0–100.0)
MCV: 93 fL (ref 78.0–100.0)
MCV: 93.1 fL (ref 78.0–100.0)
MCV: 93.4 fL (ref 78.0–100.0)
MCV: 96.1 fL (ref 78.0–100.0)
MCV: 96.8 fL (ref 78.0–100.0)
Platelets: 158 10*3/uL (ref 150–400)
Platelets: 162 10*3/uL (ref 150–400)
Platelets: 168 10*3/uL (ref 150–400)
Platelets: 169 10*3/uL (ref 150–400)
Platelets: 303 10*3/uL (ref 150–400)
Platelets: 197 10*3/uL (ref 150–400)
Platelets: 277 10*3/uL (ref 150–400)
RBC: 2.68 MIL/uL — ABNORMAL LOW (ref 3.87–5.11)
RBC: 2.73 MIL/uL — ABNORMAL LOW (ref 3.87–5.11)
RBC: 2.84 MIL/uL — ABNORMAL LOW (ref 3.87–5.11)
RBC: 2.99 MIL/uL — ABNORMAL LOW (ref 3.87–5.11)
RBC: 3.15 MIL/uL — ABNORMAL LOW (ref 3.87–5.11)
RBC: 2.06 MIL/uL — ABNORMAL LOW (ref 3.87–5.11)
RBC: 3.94 MIL/uL (ref 3.87–5.11)
RDW: 13.4 % (ref 11.5–15.5)
RDW: 13.5 % (ref 11.5–15.5)
RDW: 13.9 % (ref 11.5–15.5)
RDW: 13.9 % (ref 11.5–15.5)
RDW: 13.9 % (ref 11.5–15.5)
RDW: 11.9 % (ref 11.5–15.5)
RDW: 12.5 % (ref 11.5–15.5)
WBC: 5 10*3/uL (ref 4.0–10.5)
WBC: 5.2 10*3/uL (ref 4.0–10.5)
WBC: 6.1 10*3/uL (ref 4.0–10.5)
WBC: 6.8 10*3/uL (ref 4.0–10.5)
WBC: 7.2 10*3/uL (ref 4.0–10.5)
WBC: 6.8 10*3/uL (ref 4.0–10.5)
WBC: 7.9 10*3/uL (ref 4.0–10.5)

## 2010-12-16 LAB — COMPREHENSIVE METABOLIC PANEL
ALT: 37 U/L — ABNORMAL HIGH (ref 0–35)
AST: 28 U/L (ref 0–37)
Albumin: 4.1 g/dL (ref 3.5–5.2)
Alkaline Phosphatase: 104 U/L (ref 39–117)
BUN: 12 mg/dL (ref 6–23)
CO2: 26 mEq/L (ref 19–32)
Calcium: 9.8 mg/dL (ref 8.4–10.5)
Chloride: 100 mEq/L (ref 96–112)
Creatinine, Ser: 1.38 mg/dL — ABNORMAL HIGH (ref 0.4–1.2)
GFR calc Af Amer: 46 mL/min — ABNORMAL LOW (ref >60)
GFR calc non Af Amer: 38 mL/min — ABNORMAL LOW (ref >60)
Glucose, Bld: 128 mg/dL — ABNORMAL HIGH (ref 70–99)
Potassium: 4.3 mEq/L (ref 3.5–5.1)
Sodium: 136 mEq/L (ref 135–145)
Total Bilirubin: 0.9 mg/dL (ref 0.3–1.2)
Total Protein: 6.7 g/dL (ref 6.0–8.3)

## 2010-12-16 LAB — POCT I-STAT 4, (NA,K, GLUC, HGB,HCT)
Glucose, Bld: 151 mg/dL — ABNORMAL HIGH (ref 70–99)
HCT: 23 % — ABNORMAL LOW (ref 36.0–46.0)
Hemoglobin: 7.8 g/dL — ABNORMAL LOW (ref 12.0–15.0)
Potassium: 4 mEq/L (ref 3.5–5.1)
Sodium: 136 mEq/L (ref 135–145)

## 2010-12-16 LAB — PROTIME-INR
INR: 1.06 (ref 0.00–1.49)
Prothrombin Time: 13.7 seconds (ref 11.6–15.2)

## 2010-12-16 LAB — APTT: aPTT: 32 seconds (ref 24–37)

## 2010-12-16 LAB — POCT CARDIAC MARKERS
CKMB, poc: <1 ng/mL — ABNORMAL LOW (ref 1.0–8.0)
Myoglobin, poc: 59.4 ng/mL (ref 12–200)
Troponin i, poc: <0.05 ng/mL (ref 0.00–0.09)

## 2010-12-16 LAB — PREPARE RBC (CROSSMATCH)

## 2010-12-16 LAB — HEMOGLOBIN A1C
Hgb A1c MFr Bld: 5.9 % (ref 4.6–6.1)
Mean Plasma Glucose: 123 mg/dL

## 2010-12-16 LAB — TSH: TSH: 0.376 u[IU]/mL (ref 0.350–4.500)

## 2010-12-16 LAB — ABO/RH: ABO/RH(D): O POS

## 2011-01-26 NOTE — Assessment & Plan Note (Signed)
[facility]                         GASTROENTEROLOGY OFFICE NOTE   NAME:Jeanette Mcintosh, Jeanette Mcintosh                        MRN:          [id number]  DATE:09/05/2007                            DOB:          [birth year]    CHIEF COMPLAINT:  Reflux, not responding to Prilosec.   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 but she still has problems.  Raising the head of her bed has helped a little bit, but she has  persistent difficulty.  A barium swallow a year ago showed esophagitis.  She is a diabetic and at risk for gastroparesis which may be related.  She had to stop her metoclopramide as well as her metformin due to  diarrhea problems.   PLAN:  1. Schedule EGD to investigate GERD not controlled on Prilosec 20 mg      b.i.d.  Note that she may have to her driver come separately and we      discussed this.  2. Risks, benefits and indications were explained.  She understands      and agrees to proceed.  3. Consider gastric emptying study.  4. She had some rectal bleeding, she has known obvious hemorrhoids      with straining to stool and hard stools so I think it is likely      that we will monitor that and ask her about that when she follows      up.  She had a colonoscopy in 2004 showing the hemorrhoids, no      other polyps or other significant conditions.  5. Samples of Prevacid 30 mg b.i.d. to replace the Prilosec for the      time being to see if that helps further.   HISTORY:  As above, this lady has been having problems off and on,  increasing doses of Prilosec have not provided complete relief.  She did  raise the head of her bed which helps.  She does eat late at night  because of her busy work schedule at times.  Minimal caffeine usage, one  caffeinated beverage daily.  She does occasionally smoke as well.  There  is pill dysphagia but no food dysphagia, no weight loss.  She had  some  diarrhea related to medications as described above and then became  somewhat constipated and strained to stool and had some streaks of  bright red blood which spontaneously resolved.  She has some chest  discomfort and burning and reflux of fluid into the mouth at times as  well.  It has been getting worse despite the medication changes as  mentioned.  Barium swallow report reviewed which showed esophagitis in  the mid esophagus May 13, 2006.   MEDICATIONS:  1. Aspirin 325 mg daily.  2. B complex daily.  3. Vitamin B-12 thousand mcg daily.  4. Budeprion 150 mg b.i.d.  5. Claritin daily.  6. Clonazepam 1 mg at bedtime.  7. Cozaar 100 mg every morning.  8. Calcium with vitamin D, has been on hold.  9. Diltiazem  240 mg every morning.  10.Flexeril 10 mg 1-1/2 tabs at bedtime.  11.Folic acid 400 mcg daily.  12.Hylira 2-3 times a day,  13.Imipramine 20 mg at bedtime.  14.Lyrica 150 mg nightly.  15.Meloxicam nightly.  16.Metoprolol 100 mg daily.  17.Prilosec 20 mg b.i.d.  18.Afrin nasal spray p.r.n.  19.Hydrocodone 5/500 mg p.r.n.  20.Rutin a supplement 500 mg every morning.  21.Synthroid 137.5 mcg daily.  22.Zolpidem 10 mg at bedtime.   ALLERGIES:  ERYTHROMYCIN, IVP DYE.   PAST MEDICAL AND SURGICAL HISTORY:  1. Colonoscopy 2004 (November), sigmoid diverticulosis, grade 2      external hemorrhoids, otherwise normal.  2. Hypertension.  3. Diabetes mellitus type 2.  4. Hypothyroidism.  5. Osteoarthritis.  6. History of atrial fibrillation.  7. Cervical cancer 1970.  8. Allergies and sinus problems.  9. Cholelithiasis.  10.Hemorrhoidectomy x2.  11.Hysterectomy.  12.Appendectomy.  13.Macular degeneration, dry.  14.Total hip replacement.   FAMILY HISTORY:  Noncontributory, see medical history form.   SOCIAL HISTORY:  She is divorced, she is working as a Warden/ranger.  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  She has a Master's Degree.  Occasional  tobacco, uses some alcohol, no drugs.   REVIEW OF SYSTEMS:  See my medical history form for full details of  that.  She does have some visual difficulty, pedal edema, arthritis and  joint pain, insomnia.   PHYSICAL EXAMINATION:  Reveals a well-developed elderly white woman in  no acute distress.  Height 5 foot 4, weight 165 pounds, blood pressure  120/62, pulse 64.  EYES:  Anicteric.  MOUTH: free of lesions .  NECK:  Supple, no thyromegaly or mass.  CHEST:  Clear.  HEART:  S1 and S2, no rubs or gallops.  ABDOMEN:  Soft, nontender, without organomegaly or mass.  I hear no  succussion splash, bowel sounds are present.  There is no organomegaly  or mass.  LOWER EXTREMITIES:  Free of edema.  She is alert and oriented x3.  No neck or supraclavicular nodes  palpated.  SKIN:  Warm and dry, I see no acute rash today in the trunk.  NEUROLOGIC:  Grossly nonfocal.   I have reviewed the information supplied by Dr. Duaine Dredge today.  Note,  some of her medications such as her calcium channel blocker could  increase reflux as well, we will take that into consideration once we  complete this workup.     Iva Boop, MD,FACG  Electronically Signed    CEG/MedQ  DD: 09/05/2007  DT: 09/05/2007  Job #: [id number]   cc:   Mosetta Putt, M.D.

## 2011-01-26 NOTE — Op Note (Signed)
[name], [name]                 ACCOUNT NO.:  [id number]   MEDICAL RECORD NO.:  [id number]          PATIENT TYPE:  AMB   LOCATION:  DSC                          FACILITY:  [facility]   PHYSICIAN:  Tennis Must Meyerdierks, M.D.DATE OF BIRTH:  May 10, 1941   DATE OF PROCEDURE:  05/15/2008  DATE OF DISCHARGE:  02/21/2008                               OPERATIVE REPORT   PREOPERATIVE DIAGNOSIS:  Left long trigger finger.   POSTOPERATIVE DIAGNOSIS:  Left long trigger finger.   PROCEDURE:  Release of A1 pulley left long finger.   SURGEON:  Lowell Bouton, MD   ANESTHESIA:  Marcaine 0.50% local with sedation.   OPERATIVE FINDINGS:  The patient did have some fraying of the flexor  tendon beneath the A1 pulley.   PROCEDURE:  Under 0.50% Marcaine local anesthesia with a tourniquet on  the left arm, the left hand was prepped and draped in usual fashion and  after exsanguinating the limb the tourniquet was inflated to 250 mmHg.  An oblique incision was made in line with the distal palmar crease  overlying the A1 pulley of the long finger on the left.  Sharp  dissection was carried through the subcutaneous tissues.  Blunt  dissection was carried in the midline down to the flexor sheath and the  A1 pulley was incised sharply.  Care was taken to protect the digital  nerves.  The A1 pulley was completely released with scissors and the  finger was flexed with no further triggering.  The wound was irrigated  with saline.  The skin was closed with 4-0 nylon sutures.  Sterile  dressings were applied.  The patient had the tourniquet released with  good circulation of the hand.  She went to the recovery room awake,  stable and in good condition.      Lowell Bouton, M.D.  Electronically Signed     EMM/MEDQ  D:  05/15/2008  T:  05/16/2008  Job:  [id number]

## 2011-01-26 NOTE — Assessment & Plan Note (Signed)
[facility]                         GASTROENTEROLOGY OFFICE NOTE   NAME:[name], Jeanette Mcintosh                        MRN:          [id number]  DATE:09/05/2007                            DOB:          1940/11/08    ADDENDUM   [name] has difficulty obtaining rides, as she has no immediate family  that can give her a ride.  I did tell her she could show up driving a  cab, etc., and go through the admission process, but we would not be  able to start her procedure until we had firm evidence that her ride  home after her sedated endoscopy was present to be available.     Iva Boop, MD,FACG  Electronically Signed    CEG/MedQ  DD: 09/05/2007  DT: 09/05/2007  Job #: [id number]

## 2011-01-29 NOTE — Assessment & Plan Note (Signed)
[facility]                              CARDIOLOGY OFFICE NOTE   NAME:Jeanette Mcintosh, Jeanette Mcintosh                        MRN:          [id number]  DATE:06/15/2006                            DOB:          Apr 18, 1941    Jeanette Mcintosh is a very pleasant 70 year old female with a past medical history  of diabetes mellitus, hypertension, hyperlipidemia, hypothyroid, and  esophagitis who we were asked to evaluate for atrial fibrillation.  She  previously had no cardiac history and typically does not have dyspnea on  exertion, orthopnea, PND,  pedal edema, palpitations, presyncope, syncope or  exertional chest pain.  She states that on the morning of September 15 of  this year she woke with a migraine headache and checked her blood pressure  and it was high.  She has also noticed that her pulse was high and  irregular.  She denies any associated chest pain, shortness of breath or  palpitations.  She went to the emergency room and was found to be in atrial  fibrillation with a rapid ventricular response.  She was placed on Cardizem  and apparently converted, although I do not have those records available.  She had laboratories that showed normal potassium and normal renal function.  Her hemoglobin, hematocrit were 14 and 43.  She had 1set of cardiac markers  that were normal.  TSH was normal at 0.811.  For the 4 days following that  she also has an irregular heart rate based on a blood pressure monitor.  She  was seen by Dr. Duaine Dredge and we were asked to further evaluate for her  atrial fibrillation.   HER MEDICATIONS AT PRESENT INCLUDE:  1. Budeprion 150 mg p.o. b.i.d.  2. Calcium.  3. Cardio 120 mg p.o. daily.  4. Budeprion 150 mg.  5. Cozaar 100 mg p.o. daily.  6. Evista.  7. Flexeril.  8. Folate.  9. Lunesta.  10.Metformin 500 mg tablets 3 p.o. nightly.  11.Metoclopramide and Mobic has been discontinued.  12.Prilosec.  13.__________.  14.Multivitamin.   She  is allergic to ERYTHROMYCIN and IVP DYE FOR X-RAYS.   SOCIAL HISTORY:  She has not smoked in 3 months and has an intermittent  history of tobacco use.  She consumes 1 to 2 alcoholic beverages per  evening.   FAMILY HISTORY:  Is negative for coronary artery disease.   PAST MEDICAL HISTORY:  Is significant for diabetes mellitus, hypertension  and hyperlipidemia.  She also has hypothyroidism.  She has atrial  fibrillation as described in the HPI.  She has a history of esophagitis.  She had a prior hip replacement in July 2005 and has had cataract surgery.  She has had a partial hysterectomy secondary to cervical CA.   REVIEW OF SYSTEMS:  She denies any headaches or fever or chills.  There is  no productive cough or hemoptysis.  There is no dysphagia, odynophagia,  melena or hematochezia.  There is no dysuria, hematuria,  There is no  seizure activity.  There is no orthopnea, PND, pedal edema.  There is no  claudication.  The remaining systems are negative.   PHYSICAL EXAMINATION:  Shows a blood pressure of 124/80.  Her pulse is 82.  She weighs 150 pounds.  She is well-developed and well-nourished in no acute distress.  SKIN:  Is warm and dry.  She does not appear depressed.  There is no peripheral clubbing.  HEENT:  Unremarkable with normal eyelids.  NECK:  Is supple with normal flexion.  I cannot appreciate any bruits.  There is no jugular venous distention and no thyromegaly.  CHEST:  Clear to auscultation  on lung expansion.  CARDIOVASCULAR:  Reveals a regular rate and rhythm with normal S1 and S2.  There are no murmurs, rubs or gallops noted.  ABDOMEN:  Nontender, positive bowel sounds.  No hepatosplenomegaly, no mass  appreciated.  There is no abdominal bruit. She has 2+ femoral pulses  bilaterally.  No bruits.  EXTREMITIES:  Show no edema  and I can palpate no cords  .  She has 2+  dorsalis pedis pulses bilaterally.  NEUROLOGIC:  Grossly intact.   Her electrocardiogram shows  a normal sinus rhythm with rate of 82.  The axis  is normal.  There are no ST changes noted.   DIAGNOSES:  1. Paroxysmal atrial fibrillation.  2. Diabetes mellitus.  3. Hypertension.  4. Hyperlipidemia.  5. Hypothyroidism.  6. History of esophagitis.   PLAN:  Jeanette Mcintosh has had episodes of atrial fibrillation documented with her  telemetry in the emergency room.  Note her TSH was normal.  We will schedule  her to have an echocardiogram to quantify left ventricular function as well  as a stress Myoview.  I had long discussions with her today concerning her  embolic risk including CVA.  She has embolic risk factors of 865 (given that  she is a female), hypertension and diabetes mellitus.  I therefore have  recommended Coumadin with a goal line of 2 to 3.  However, she declines as  she is concerned about the risk of Coumadin.  She does understand the risk  of an embolic  event.  I therefore recommended enteric coated aspirin 325 mg  p.o. daily.  She was somewhat hesitant about that given her  history of esophagitis, but has agreed to try that.  We will see her back in  3 months to review the above.            ______________________________  Madolyn Frieze. Jens Som, MD, [facility]     BSC/MedQ  DD:  06/15/2006  DT:  06/16/2006  Job #:  [id number]   cc:   Mosetta Putt, M.D.

## 2011-01-29 NOTE — Op Note (Signed)
[facility]  Patient:    Jeanette Mcintosh, Jeanette Mcintosh                        MRN: [id number] Proc. Date: 05/13/00 Adm. Date:  [id number] Attending:  Carson Myrtle                           Operative Report  PREOPERATIVE DIAGNOSIS:  Internal and external hemorrhoids.  POSTOPERATIVE DIAGNOSIS:  Internal and external hemorrhoids.  PROCEDURE:  Flexible sigmoidoscopy and hemorrhoidectomy.  SURGEON:  Timothy E. Earlene Plater, M.D.  ANESTHESIA:  CRNA supervised MD.  INDICATIONS:  The patient is 23 and some element of an irritable bowel syndrome, and has had six vaginal deliveries.  She has a moderate degree of pelvic relaxation, but is troubled mostly with prolapse of hemorrhoids.  We have tried conservative management without much help.  She now elects to proceed with hemorrhoidectomy as recommended.  She has never had colon screening.  DESCRIPTION OF PROCEDURE:  The patient was brought to the operating room and placed supine.  LMA anesthesia provided.  She was placed in the lithotomy. Perianal area inspected, prepped and draped.  Flexible sigmoidoscopy accomplished to 55 cm.  The mucosa and anatomy were thought to be normal.  No lesions were noted.  The scope was withdrawn and the area reprepped. Hemorrhoids were present.  A fourth degree right anterior, fourth degree posterior left posterior, third degree left lateral, and external right posterior.  The area was injected around and about with 0.5% Marcaine with epinephrine, and mixed 9:1 with [place] and massaged in well.  The RALLP were each completely excised as an ellipse, undermining the edges, and closed with running 2-0 chromic.  The left lateral was then ligated and the right posterior was excised as external, undermining the edges, removing the superficial varicosities, and closed with a running 2-0 chromic.  There was no other pathology and no complications.  Bleeding was controlled. The sphincters were  intact.  Gelfoam gauze and dry sterile dressing applied. She tolerated it well and was removed to the recovery room in good condition.  Written and verbal instructions were given to the patient including Demerol 50 mg tablets, #40, and she will be followed as an outpatient. DD:  05/13/00 TD:  05/13/00 Job: [id number]

## 2011-06-16 LAB — POCT HEMOGLOBIN-HEMACUE: Hemoglobin: 14.1

## 2011-06-16 LAB — GLUCOSE, CAPILLARY
Glucose-Capillary: 171 — ABNORMAL HIGH
Glucose-Capillary: 187 — ABNORMAL HIGH

## 2012-01-20 HISTORY — PX: US ECHOCARDIOGRAPHY: HXRAD669

## 2012-10-02 ENCOUNTER — Encounter (INDEPENDENT_AMBULATORY_CARE_PROVIDER_SITE_OTHER): Payer: Self-pay

## 2012-10-12 ENCOUNTER — Ambulatory Visit (INDEPENDENT_AMBULATORY_CARE_PROVIDER_SITE_OTHER): Payer: Medicare Other | Admitting: General Surgery

## 2012-10-18 ENCOUNTER — Encounter (INDEPENDENT_AMBULATORY_CARE_PROVIDER_SITE_OTHER): Payer: Self-pay | Admitting: General Surgery

## 2012-10-18 ENCOUNTER — Ambulatory Visit (INDEPENDENT_AMBULATORY_CARE_PROVIDER_SITE_OTHER): Payer: Medicare Other | Admitting: General Surgery

## 2012-10-18 VITALS — BP 112/74 | HR 68 | Temp 96.8°F | Resp 18 | Ht 63.0 in | Wt 146.8 lb

## 2012-10-18 DIAGNOSIS — R159 Full incontinence of feces: Secondary | ICD-10-CM

## 2012-10-18 NOTE — Progress Notes (Signed)
Chief Complaint  Patient presents with  . New Evaluation    eval rectal incontinence    HISTORY: Jeanette Mcintosh is a 72 y.o. female who presents to the office with fecal leakage.  Other symptoms include diarrhea and constipation. She has leakage once a week or every other week.  She has recurrent UTI's. This had been occurring since her hemorrhoidectomy in the late 90's by Dr Jeanette Mcintosh.  She has tried fiber supplements in the past that help a little.  Diarrhea makes the symptoms worse.   It is intermittent in nature.  Her bowel habits are irregular and her bowel movements are hard then diarrhea.  Her fiber intake is moderate.  Her last colonoscopy was 9/13 and due in 2023. She had 5 vaginal childbirths and had episiotomies with 4.    Past Medical History  Diagnosis Date  . Arthritis   . Blood transfusion without reported diagnosis   . Cancer   . Diabetes mellitus without complication   . GERD (gastroesophageal reflux disease)   . Hyperlipidemia   . Hypertension   . Thyroid disease       Past Surgical History  Procedure Date  . Trigger finger release 1990's    Several surgeries.  . Hemorrhoid surgery 1990's    x2  . Cataract extraction 2002  . Total hip arthroplasty 2012    Left  . Total hip arthroplasty 2002    Right  . Appendectomy 1971  . Abdominal hysterectomy 1971    Partial        Current Outpatient Prescriptions  Medication Sig Dispense Refill  . aspirin 81 MG tablet Take 81 mg by mouth daily.      Marland Kitchen buPROPion (WELLBUTRIN SR) 150 MG 12 hr tablet Take 150 mg by mouth daily.      . carisoprodol (SOMA) 250 MG tablet Take 250 mg by mouth at bedtime.       . chlorthalidone (HYGROTON) 25 MG tablet Take 25 mg by mouth daily.       . clonazePAM (KLONOPIN) 0.5 MG tablet Take 0.5 mg by mouth at bedtime.      Marland Kitchen LANTUS SOLOSTAR 100 UNIT/ML injection Inject 14 Units into the skin at bedtime.       Marland Kitchen latanoprost (XALATAN) 0.005 % ophthalmic solution Place 1 drop into both eyes at  bedtime.       Marland Kitchen levothyroxine (SYNTHROID, LEVOTHROID) 137 MCG tablet Take 137 mcg by mouth daily.       Marland Kitchen losartan (COZAAR) 100 MG tablet Take 100 mg by mouth daily.       . meloxicam (MOBIC) 7.5 MG tablet Take 7.5 mg by mouth daily.       . metoprolol succinate (TOPROL-XL) 50 MG 24 hr tablet Take 50 mg by mouth 2 (two) times daily. Take with or immediately following a meal.      . pravastatin (PRAVACHOL) 20 MG tablet Take 20 mg by mouth at bedtime.          Allergies  Allergen Reactions  . Antihistamines, Diphenhydramine-Type     Jerk all over.  . Morphine And Related Nausea Only  . Phenergan (Promethazine Hcl)     Jerk all over.  . Erythromycin       Family History  Problem Relation Age of Onset  . Basal cell carcinoma Father   . Cancer Other     All (5) had Hysterectomies and high dose Estrogen in 1950's were all diagnosed with Breast Cancer.  History   Social History  . Marital Status: Divorced    Spouse Name: N/A    Number of Children: N/A  . Years of Education: N/A   Social History Main Topics  . Smoking status: Former Smoker    Types: Cigarettes    Quit date: 09/14/2007  . Smokeless tobacco: Never Used  . Alcohol Use: Yes     Comment: 1/Evening  . Drug Use: No  . Sexually Active:    Other Topics Concern  . None   Social History Narrative  . None      REVIEW OF SYSTEMS - PERTINENT POSITIVES ONLY: Review of Systems - General ROS: negative for - chills, fever or weight loss Hematological and Lymphatic ROS: negative for - bleeding problems, blood clots or bruising Respiratory ROS: no cough, shortness of breath, or wheezing Cardiovascular ROS: no chest pain or dyspnea on exertion Gastrointestinal ROS: positive for - abdominal pain, constipation, diarrhea and stool incontinence negative for - nausea/vomiting Genito-Urinary ROS: no dysuria, trouble voiding, or hematuria  EXAM: Filed Vitals:   10/18/12 1442  BP: 112/74  Pulse: 68  Temp: 96.8 F (36  C)  Resp: 18    General appearance: alert, cooperative and no distress Resp: clear to auscultation bilaterally Cardio: regular rate and rhythm GI: soft, non-tender; bowel sounds normal; no masses,  no organomegaly   Procedure: Anoscopy Surgeon: Jeanette Mcintosh Diagnosis: fecal leakage  Assistant: Jeanette Mcintosh After the risks and benefits were explained, verbal consent was obtained for above procedure  Anesthesia: none Findings: slight rectocele, decreased rectal tone, good squeeze posteriorly but no contraction anteriorly.  Minimal hemorrhoid disease, several small skin tags    ASSESSMENT AND PLAN: Jeanette Mcintosh is 72 y.o. F who was referred to me for occasional fecal leakage. On exam she has an obvious anterior sphincter defect, but the rest of her sphincter seems to contract well.  She does have some decreased tone as well.  She is going to try to increase her fiber intake, as she is not comfortable with any major procedures right now.  We discussed the possibility of Solesta injections, which may give her a little better control.  She will return to my office in 2 months.   Jeanette Panda, MD Colon and Rectal Surgery / General Surgery [facility]      Visit Diagnoses: 1. Fecal incontinence     Primary Care Physician: No primary provider on file.

## 2012-10-18 NOTE — Patient Instructions (Signed)
GETTING TO GOOD BOWEL HEALTH. Irregular bowel habits such as constipation can lead to many problems over time.  Having one soft bowel movement a day is the most important way to prevent further problems.  The anorectal canal is designed to handle stretching and feces to safely manage our ability to get rid of solid waste (feces, poop, stool) out of our body.  BUT, hard constipated stools can act like ripping concrete bricks causing inflamed hemorrhoids, anal fissures, abdominal pain and bloating.     The goal: ONE SOFT BOWEL MOVEMENT A DAY!  To have soft, regular bowel movements:    Drink at least 8 tall glasses of water a day.     Take plenty of fiber.  Fiber is the undigested part of plant food that passes into the colon, acting s "natures broom" to encourage bowel motility and movement.  Fiber can absorb and hold large amounts of water. This results in a larger, bulkier stool, which is soft and easier to pass. Work gradually over several weeks up to 6 servings a day of fiber (25g a day even more if needed) in the form of: o Vegetables -- Root (potatoes, carrots, turnips), leafy green (lettuce, salad greens, celery, spinach), or cooked high residue (cabbage, broccoli, etc) o Fruit -- Fresh (unpeeled skin & pulp), Dried (prunes, apricots, cherries, etc ),  or stewed ( applesauce)  o Whole grain breads, pasta, etc (whole wheat)  o Bran cereals    Bulking Agents -- This type of water-retaining fiber generally is easily obtained each day by one of the following:  o Psyllium bran -- The psyllium plant is remarkable because its ground seeds can retain so much water. This product is available as Metamucil, Konsyl, Effersyllium, Per Diem Fiber, or the less expensive generic preparation in drug and health food stores. Although labeled a laxative, it really is not a laxative.  o Methylcellulose -- This is another fiber derived from wood which also retains water. It is available as Citrucel. o Polyethylene Glycol  - and "artificial" fiber commonly called Miralax or Glycolax.  It is helpful for people with gassy or bloated feelings with regular fiber o Flax Seed - a less gassy fiber than psyllium   No reading or other relaxing activity while on the toilet. If bowel movements take longer than 5 minutes, you are too constipated   AVOID CONSTIPATION.  High fiber and water intake usually takes care of this.  Sometimes a laxative is needed to stimulate more frequent bowel movements, but    Laxatives are not a good long-term solution as it can wear the colon out. o Osmotics (Milk of Magnesia, Fleets phosphosoda, Magnesium citrate, MiraLax, GoLytely) are safer than  o Stimulants (Senokot, Castor Oil, Dulcolax, Ex Lax)    o Do not take laxatives for more than 7days in a row.    IF SEVERELY CONSTIPATED, try a Bowel Retraining Program: o Do not use laxatives.  o Eat a diet high in roughage, such as bran cereals and leafy vegetables.  o Drink six (6) ounces of prune or apricot juice each morning.  o Eat two (2) large servings of stewed fruit each day.  o Take one (1) heaping tablespoon of a psyllium-based bulking agent twice a day. Use sugar-free sweetener when possible to avoid excessive calories.  o Eat a normal breakfast.  o Set aside 15 minutes after breakfast to sit on the toilet, but do not strain to have a bowel movement.  o If you do   not have a bowel movement by the third day, use an enema and repeat the above steps.    Bowel Incontinence  What is incontinence? Incontinence is the impaired ability to control gas or stool. Its severity ranges from mild difficulty with gas control to severe loss of control over liquid and formed stools. Incontinence to stool is a common problem, but often it is not discussed due to embarassment. What causes incontinence? There are many causes of incontinence. Injury during childbirth is one of the most common causes. These injuries may cause a tear in the anal muscles. The  nerves supplying the anal muscles may also be injured. While some injuries may be recognized immediately following childbirth, many others may go unnoticed and not become a problem until later in life. In these situations, a prior childbirth may not be recognized as the cause of incontinence. Anal operations or traumatic injury to the tissue surrounding the anal region similarly can damage the anal muscles and hinder bowel control. Some individuals experience loss of strength in the anal muscles as they age. As a result, a minor control problem in a younger person may become more significant later in life. Diarrhea may be associated with a feeling of urgency or stool leakage due to the frequent liquid stools passing through the anal opening. If bleeding accompanies your bowel movements or you have lack of bowel control, consult your physician. These symptoms may indicate inflammation within the colon (colitis), a rectal tumor or rectal prolapse - all conditions that require prompt evaluation by a physician.    How is the cause of incontinence determined? An initial discussion of the problem with your physician will help establish the degree of control difficulty and its impact on your lifestyle. Many clues to the origin of incontinence may be found in patient histories. For example, a woman's history of past childbirths is very important. Multiple pregnancies, large weight babies, forceps deliveries, or episiotomies may contribute to muscle or nerve injury at the time of childbirth. In some cases, medical illnesses and medications play a role in problems with control. A physical exam of the anal region should be performed. It may readily identify an obvious injury to the anal muscles. In addition, an ultrasound probe can be used within the anal area to provide a picture of the muscles and show areas in which the anal muscles have been injured. Frequently, additional studies are required to define the anal  area more completely. In a test called anal manometry, a small catheter is placed into the anus to record pressure as patients relax and tighten the anal muscles. This test can demonstrate how weak or strong the muscle really is. A separate test may also be conducted to determine if the nerves that go to the anal muscles are functioning properly. What can be done to correct the problem? Treatment of incontinence may include: Marland Kitchen     Dietary changes .     Constipating medications .     Muscle strengthening exercises .     Biofeedback  Injectable bulking agents  Surgical muscle repair  Artificial anal sphincter  Sacral nerve stimulator  After a careful history, physical examination and testing to determine the cause and severity of the problem, treatment can be addressed. Mild problems may be treated very simply with dietary changes and the use of some constipating medications. Diseases which cause inflammation in the rectum, such as colitis, may contribute to anal control problems. Treating these diseases also may eliminate or improve  symptoms of incontinence. Sometimes a change in prescribed medications may help. Your physician also may recommend simple home exercises that may strengthen the anal muscles to help in mild cases. A type of physical therapy called biofeedback can be used to help patients sense when stool is ready to be evacuated and help strengthen the muscles. Injuries to the anal muscles may be repaired with surgery. Some individuals may benefit from a technique that delivers electrical energy to the skin and muscles surrounding the anus which results in firming and thickening of this area to help with continence. In certain individuals that have nerve damage or anal muscles that are damaged beyond repair, an artificial sphincter may be implanted. The artificial sphincter is a plastic, fluid filled doughnut that is surgically implanted around the damaged anal sphincter. This artificial  sphincter keeps the anal canal closed. When an individual wants to have a bowel movement, the fluid can be pumped out of the doughnut to allow the anal canal to open. In extreme cases, patients may find that a colostomy is the best option for improving their quality of life. What is a colon and rectal surgeon? Colon and rectal surgeons are experts in the surgical and non-surgical treatment of diseases of the colon, rectum and anus. They have completed advanced surgical training in the treatment of these diseases as well as full general surgical training. Board-certified colon and rectal surgeons complete residencies in general surgery and colon and rectal surgery, and pass intensive examinations conducted by the American Board of Surgery and the American Board of Colon and Rectal Surgery. They are well-versed in the treatment of both benign and malignant diseases of the colon, rectum and anus and are able to perform routine screening examinations and surgically treat conditions if indicated to do so.  2012 American Society of Colon & Rectal Surgeons

## 2012-11-10 ENCOUNTER — Encounter (INDEPENDENT_AMBULATORY_CARE_PROVIDER_SITE_OTHER): Payer: Self-pay

## 2012-12-19 ENCOUNTER — Ambulatory Visit (INDEPENDENT_AMBULATORY_CARE_PROVIDER_SITE_OTHER): Payer: Medicare Other | Admitting: General Surgery

## 2012-12-19 VITALS — BP 138/86 | HR 68 | Temp 97.1°F | Resp 16 | Ht 63.0 in | Wt 144.0 lb

## 2012-12-19 DIAGNOSIS — R159 Full incontinence of feces: Secondary | ICD-10-CM

## 2012-12-19 NOTE — Patient Instructions (Signed)
Call the office if your fecal leakage becomes too hard to control with your diet.

## 2012-12-19 NOTE — Progress Notes (Signed)
Jeanette Mcintosh is a 72 y.o. female who is here for a follow up visit regarding her fecal leakage.  This is better with increased fiber in her diet but she is still having issues with motility and bloating.  She has early satiety as well.  Objective: Filed Vitals:   12/19/12 1147  BP: 138/86  Pulse: 68  Temp: 97.1 F (36.2 C)  Resp: 16    General appearance: alert and cooperative GI: normal findings: soft, non-tender   Assessment and Plan: Her fecal leakage is better with dietary changes, so we decided to not pursue this any further at the moment.  I encouraged her to continue to work with her GI doctor regarding her motility issues.      Vanita Panda, MD [facility], [place] [phone]

## 2013-07-27 ENCOUNTER — Other Ambulatory Visit (HOSPITAL_COMMUNITY): Payer: Self-pay | Admitting: Internal Medicine

## 2013-07-27 ENCOUNTER — Ambulatory Visit (HOSPITAL_COMMUNITY)
Admission: RE | Admit: 2013-07-27 | Discharge: 2013-07-27 | Disposition: A | Discharge status: Home / Self Care | Payer: Medicare Other | Source: Ambulatory Visit | Attending: Internal Medicine | Admitting: Internal Medicine

## 2013-07-27 DIAGNOSIS — R05 Cough: Secondary | ICD-10-CM | POA: Insufficient documentation

## 2013-07-27 DIAGNOSIS — R059 Cough, unspecified: Secondary | ICD-10-CM

## 2013-07-27 DIAGNOSIS — I4891 Unspecified atrial fibrillation: Secondary | ICD-10-CM | POA: Insufficient documentation

## 2013-07-27 DIAGNOSIS — F17200 Nicotine dependence, unspecified, uncomplicated: Secondary | ICD-10-CM | POA: Insufficient documentation

## 2014-05-22 ENCOUNTER — Encounter: Payer: Medicare Other | Attending: Internal Medicine | Admitting: *Deleted

## 2014-05-22 DIAGNOSIS — E119 Type 2 diabetes mellitus without complications: Secondary | ICD-10-CM | POA: Insufficient documentation

## 2014-05-22 DIAGNOSIS — Z794 Long term (current) use of insulin: Secondary | ICD-10-CM | POA: Insufficient documentation

## 2014-05-22 DIAGNOSIS — Z713 Dietary counseling and surveillance: Secondary | ICD-10-CM | POA: Insufficient documentation

## 2014-05-22 NOTE — Progress Notes (Signed)
Appt start time: 1400 end time:  1500.  Assessment:  Patient was seen on  05/22/14 for individual diabetes education. Ms. Maddalena presents with c/o gastric difficulties. She noted that a gastroenterologist suggested that she might has gastroparesis which she denied. At this time she feels that this may be a valid diagnosis. Her appetite has decreased significantly and gets full very quickly. She has both constipation and diarrhea. I consulted with Lajean Saver, MS, RD, LDN and it was determined that Ms. Chestnutt is  very knowledgeable about pairing carbs and protein and carbohydrate counting. A Plan was developed by the patient, dietitian and myself.  Patient Education Plan per assessed needs and concerns is to attend individual session for Diabetes Self Management Education.  Current HbA1c: 6.5%  Preferred Learning Style:   No preference indicated   Learning Readiness:   Ready  MEDICATIONS: See list: Lantus self adjusting  DIETARY INTAKE:  B ( AM): decaf coffee creamer / bacon , egg, toast / oatbran Snk ( AM): none  L (4 PM): piece of cheese/tomato pizza / chicken & salad   Milk, peanut butter, gram crackers Snk ( PM): none D ( 9PM): spanish rice & cheese, meat, onions, peppers, tomato sauce 1/2 C Snk ( PM): peach smoothie,  Greek yogurt, frozen peaches, splenda Beverages: whole milk X3 per day immediately after meal, water, ice tea splenda  Usual physical activity: none  PLAN: Make an appointment with an endocrinologist for further management of your diabetes Keep a food diary and take to all doctor appointment Make an appointment with a gastroenterologist for further management of your GI symptoms (diarrhea) Contact PCP for order for testing supplies   Barriers to learning/adherence to lifestyle change: Complex medical conditions  Follow up as needed.

## 2014-05-22 NOTE — Patient Instructions (Addendum)
Make an appointment with an endocrinologist for further management of your diabetes Keep a food diary and take to all doctor appointment Make an appointment with a gastroenterologist for further management of your GI symptoms (diarrhea) Contact PCP for order for testing supplies

## 2014-06-26 ENCOUNTER — Other Ambulatory Visit: Payer: Self-pay | Admitting: Gastroenterology

## 2014-06-26 ENCOUNTER — Other Ambulatory Visit (HOSPITAL_COMMUNITY): Payer: Self-pay | Admitting: Gastroenterology

## 2014-06-26 DIAGNOSIS — R630 Anorexia: Secondary | ICD-10-CM

## 2014-07-08 ENCOUNTER — Ambulatory Visit
Admission: RE | Admit: 2014-07-08 | Discharge: 2014-07-08 | Disposition: A | Discharge status: Home / Self Care | Payer: Medicare Other | Source: Ambulatory Visit | Attending: Gastroenterology | Admitting: Gastroenterology

## 2014-07-08 ENCOUNTER — Other Ambulatory Visit: Payer: Self-pay | Admitting: Gastroenterology

## 2014-07-08 DIAGNOSIS — R630 Anorexia: Secondary | ICD-10-CM

## 2014-07-12 ENCOUNTER — Ambulatory Visit (HOSPITAL_COMMUNITY)
Admission: RE | Admit: 2014-07-12 | Discharge: 2014-07-12 | Disposition: A | Discharge status: Home / Self Care | Payer: Medicare Other | Source: Ambulatory Visit | Attending: Gastroenterology | Admitting: Gastroenterology

## 2014-07-12 DIAGNOSIS — R630 Anorexia: Secondary | ICD-10-CM

## 2014-07-12 DIAGNOSIS — R1084 Generalized abdominal pain: Secondary | ICD-10-CM | POA: Insufficient documentation

## 2014-07-12 MED ORDER — TECHNETIUM TC 99M SULFUR COLLOID
2.1000 | Freq: Once | INTRAVENOUS | Status: AC | PRN
  Administered 2014-07-12: 2.1 via ORAL

## 2014-08-02 ENCOUNTER — Other Ambulatory Visit: Payer: Self-pay | Admitting: Gastroenterology

## 2015-03-24 ENCOUNTER — Encounter: Payer: Self-pay | Admitting: *Deleted

## 2015-03-24 ENCOUNTER — Other Ambulatory Visit: Payer: Self-pay | Admitting: *Deleted

## 2015-04-16 ENCOUNTER — Encounter: Payer: Self-pay | Admitting: Cardiovascular Disease

## 2015-05-05 ENCOUNTER — Other Ambulatory Visit (HOSPITAL_COMMUNITY): Payer: Self-pay | Admitting: Internal Medicine

## 2015-05-05 DIAGNOSIS — R030 Elevated blood-pressure reading, without diagnosis of hypertension: Principal | ICD-10-CM

## 2015-05-05 DIAGNOSIS — IMO0001 Reserved for inherently not codable concepts without codable children: Secondary | ICD-10-CM

## 2015-05-06 ENCOUNTER — Ambulatory Visit (HOSPITAL_COMMUNITY): Admission: RE | Admit: 2015-05-06 | Payer: Medicare Other | Source: Ambulatory Visit

## 2015-05-07 ENCOUNTER — Ambulatory Visit (HOSPITAL_COMMUNITY)
Admission: RE | Admit: 2015-05-07 | Discharge: 2015-05-07 | Disposition: A | Discharge status: Home / Self Care | Payer: Medicare Other | Source: Ambulatory Visit | Attending: Internal Medicine | Admitting: Internal Medicine

## 2015-05-07 ENCOUNTER — Other Ambulatory Visit (HOSPITAL_COMMUNITY): Payer: Self-pay | Admitting: Internal Medicine

## 2015-05-07 ENCOUNTER — Ambulatory Visit (HOSPITAL_COMMUNITY): Payer: Medicare Other

## 2015-05-07 DIAGNOSIS — IMO0001 Reserved for inherently not codable concepts without codable children: Secondary | ICD-10-CM

## 2015-05-07 DIAGNOSIS — Z0000 Encounter for general adult medical examination without abnormal findings: Secondary | ICD-10-CM

## 2015-05-07 DIAGNOSIS — R030 Elevated blood-pressure reading, without diagnosis of hypertension: Principal | ICD-10-CM

## 2015-07-29 ENCOUNTER — Encounter: Payer: Self-pay | Admitting: Podiatry

## 2015-07-29 ENCOUNTER — Ambulatory Visit (INDEPENDENT_AMBULATORY_CARE_PROVIDER_SITE_OTHER): Payer: Medicare Other | Admitting: Podiatry

## 2015-07-29 ENCOUNTER — Ambulatory Visit (INDEPENDENT_AMBULATORY_CARE_PROVIDER_SITE_OTHER): Payer: Medicare Other

## 2015-07-29 DIAGNOSIS — E1142 Type 2 diabetes mellitus with diabetic polyneuropathy: Secondary | ICD-10-CM | POA: Diagnosis not present

## 2015-07-29 DIAGNOSIS — M779 Enthesopathy, unspecified: Secondary | ICD-10-CM

## 2015-07-29 DIAGNOSIS — M71571 Other bursitis, not elsewhere classified, right ankle and foot: Secondary | ICD-10-CM

## 2015-07-29 DIAGNOSIS — M7751 Other enthesopathy of right foot: Secondary | ICD-10-CM

## 2015-07-29 NOTE — Progress Notes (Signed)
   Subjective:    Patient ID: Jeanette Mcintosh, female    DOB: 08/26/1941, 74 y.o.   MRN: [id number]  HPI Comments:   Diabetic for 20 years or more and last A1C was 6.5  Foot Pain Associated symptoms include arthralgias, coughing and a rash.   she presents today chief complaint of pain to the lateral aspect of her right foot. She states that she noticed some tenderness on the lateral aspect of her right foot and then started to notice swelling and a hard callus that developed. She states that she was wearing very hard sided shoes and hard soled shoes which possibly resulted in some tenderness she states she is not sure whether or not she broke her foot that she noticed swelling shortly after the pain started in the bone.    Review of Systems  HENT: Positive for sinus pressure.   Eyes: Positive for visual disturbance.  Respiratory: Positive for cough.   Cardiovascular: Positive for palpitations.  Gastrointestinal: Positive for diarrhea and constipation.  Genitourinary: Positive for frequency.  Musculoskeletal: Positive for back pain, arthralgias and gait problem.  Skin: Positive for rash.  Allergic/Immunologic: Positive for environmental allergies and food allergies.  Hematological: Bruises/bleeds easily.  All other systems reviewed and are negative.      Objective:   Physical Exam: She presents today vital signs stable alert and oriented 3 no acute distress. Pulses are palpable bilateral. Neurologic sensorium is intact but possibly some slightly diminished per Semmes-Weinstein monofilament. Deep tendon reflexes are intact muscle strength is 5 over 5 dorsi splint flexors and inverters everters on physical musculatures intact. Orthopedic evaluation and x-rays all joints distal to the ankle for range of motion without crepitation. She has pain on palpation fifth metatarsal base of the right foot with mild edema overlying the area no ecchymosis. There is some boggy fluid filled bursa overlying  the dorsal lateral aspect of the fifth metatarsal base right. Radiograph confirms what appears to have been a fracture fifth metatarsal right resulting in bursitis.        Assessment & Plan:  Assessment after fifth met right with resultant bursitis the fracture appears to be healed. Diabetic peripheral neuropathy.  Plan: I offered the patient an injection today for the bursitis that she declined. I'll follow-up with her as needed. 

## 2015-09-18 ENCOUNTER — Other Ambulatory Visit (HOSPITAL_COMMUNITY): Payer: Self-pay | Admitting: Internal Medicine

## 2015-09-18 ENCOUNTER — Encounter: Payer: Self-pay | Admitting: Internal Medicine

## 2015-09-18 DIAGNOSIS — I4891 Unspecified atrial fibrillation: Secondary | ICD-10-CM

## 2015-09-22 ENCOUNTER — Ambulatory Visit (HOSPITAL_COMMUNITY)
Admission: RE | Admit: 2015-09-22 | Discharge: 2015-09-22 | Disposition: A | Discharge status: Home / Self Care | Payer: Medicare Other | Source: Ambulatory Visit | Attending: Internal Medicine | Admitting: Internal Medicine

## 2015-09-22 DIAGNOSIS — I071 Rheumatic tricuspid insufficiency: Secondary | ICD-10-CM | POA: Diagnosis not present

## 2015-09-22 DIAGNOSIS — I10 Essential (primary) hypertension: Secondary | ICD-10-CM | POA: Diagnosis not present

## 2015-09-22 DIAGNOSIS — I351 Nonrheumatic aortic (valve) insufficiency: Secondary | ICD-10-CM | POA: Insufficient documentation

## 2015-09-22 DIAGNOSIS — I4891 Unspecified atrial fibrillation: Secondary | ICD-10-CM | POA: Insufficient documentation

## 2015-09-22 DIAGNOSIS — E785 Hyperlipidemia, unspecified: Secondary | ICD-10-CM | POA: Diagnosis not present

## 2015-09-22 DIAGNOSIS — I340 Nonrheumatic mitral (valve) insufficiency: Secondary | ICD-10-CM | POA: Insufficient documentation

## 2015-09-22 DIAGNOSIS — Z87891 Personal history of nicotine dependence: Secondary | ICD-10-CM | POA: Insufficient documentation

## 2015-09-22 DIAGNOSIS — E119 Type 2 diabetes mellitus without complications: Secondary | ICD-10-CM | POA: Insufficient documentation

## 2015-09-22 NOTE — Progress Notes (Signed)
  Echocardiogram 2D Echocardiogram has been performed.  Jennette Dubin 09/22/2015, 10:12 AM

## 2016-02-13 ENCOUNTER — Ambulatory Visit (INDEPENDENT_AMBULATORY_CARE_PROVIDER_SITE_OTHER): Payer: Medicare Other | Admitting: Allergy and Immunology

## 2016-02-13 ENCOUNTER — Encounter: Payer: Self-pay | Admitting: Allergy and Immunology

## 2016-02-13 VITALS — BP 122/78 | HR 68 | Temp 98.5°F | Resp 16 | Ht 62.6 in | Wt 147.7 lb

## 2016-02-13 DIAGNOSIS — J309 Allergic rhinitis, unspecified: Secondary | ICD-10-CM

## 2016-02-13 DIAGNOSIS — H1010 Acute atopic conjunctivitis, unspecified eye: Secondary | ICD-10-CM | POA: Diagnosis not present

## 2016-02-13 DIAGNOSIS — J4540 Moderate persistent asthma, uncomplicated: Secondary | ICD-10-CM

## 2016-02-13 MED ORDER — BUDESONIDE-FORMOTEROL FUMARATE 160-4.5 MCG/ACT IN AERO: 2.0000 | INHALATION_SPRAY | Freq: Two times a day (BID) | RESPIRATORY_TRACT | Status: DC

## 2016-02-13 MED ORDER — TRIAMCINOLONE ACETONIDE 55 MCG/ACT NA AERO: INHALATION_SPRAY | NASAL | Status: DC

## 2016-02-13 NOTE — Patient Instructions (Addendum)
Take Home Sheet  1. Avoidance: Mold and Pollen   2. Antihistamine: Allegra 180mg  by mouth once daily for runny nose or itching.   3. Nasal Spray: Nasacort AQ 1-2 spray(s) each nostril once daily for stuffy nose or drainage.       STOP FLONASE  4. Inhalers:  Rescue: ProAir 2 puffs every 4 hours as needed for cough or wheeze.       -May use 2 puffs 10-20 minutes prior to exercise.   Preventative: Symbicort 171mcg 2 puffs twice daily (Rinse, gargle, and spit out after use).   5.  Continue to remain smoke free.   6. Nasal Saline wash each evening at shower time.   7. Follow up Visit: 4-6 weeks or sooner if needed.   Websites that have reliable Patient information: 1. American Academy of Asthma, Allergy, & Immunology: www.aaaai.org 2. Food Allergy Network: www.foodallergy.org 3. Mothers of Asthmatics: www.aanma.org 4. [place]: DiningCalendar.de 5. American College of Allergy, Asthma, & Immunology: https://robertson.info/ or www.acaai.org  Reducing Pollen Exposure  The American Academy of Allergy, Asthma and Immunology suggests the following steps to reduce your exposure to pollen during allergy seasons.  1. Do not hang sheets or clothing out to dry; pollen may collect on these items. 2. Do not mow lawns or spend time around freshly cut grass; mowing stirs up pollen. 3. Keep windows closed at night.  Keep car windows closed while driving. 4. Minimize morning activities outdoors, a time when pollen counts are usually at their highest. 5. Stay indoors as much as possible when pollen counts or humidity is high and on windy days when pollen tends to remain in the air longer. 6. Use air conditioning when possible.  Many air conditioners have filters that trap the pollen spores. 7. Use a HEPA room air filter to remove pollen form the indoor air you breathe.  Control of Mold Allergen  Mold and fungi can grow on a variety of surfaces provided certain  temperature and moisture conditions exist.  Outdoor molds grow on plants, decaying vegetation and soil.  The major outdoor mold, Alternaria dn Cladosporium, are found in very high numbers during hot and dry conditions.  Generally, a late Summer - Fall peak is seen for common outdoor fungal spores.  Rain will temporarily lower outdoor mold spore count, but counts rise rapidly when the rainy period ends.  The most important indoor molds are Aspergillus and Penicillium.  Dark, humid and poorly ventilated basements are ideal sites for mold growth.  The next most common sites of mold growth are the bathroom and the kitchen.  Outdoor [employer] 1. Use air conditioning and keep windows closed 2. Avoid exposure to decaying vegetation. 3. Avoid leaf raking. 4. Avoid grain handling. 5. Consider wearing a face mask if working in moldy areas.  Indoor Mold Control 1. Maintain humidity below 50%. 2. Clean washable surfaces with 5% bleach solution. 3. Remove sources e.g. Contaminated carpets.  Control of Cockroach Allergen  Cockroach allergen has been identified as an important cause of acute attacks of asthma, especially in urban settings.  There are fifty-five species of cockroach that exist in the [place], however only three, the [place], [place] species produce allergen that can affect patients with Asthma.  Allergens can be obtained from fecal particles, egg casings and secretions from cockroaches.  1. Remove food sources. 2. Reduce access to water. 3. Seal access and entry points. 4. Spray runways with 0.5-1% Diazinon or Chlorpyrifos 5.  Blow boric acid power under stoves and refrigerator. 6. Place bait stations (hydramethylnon) at feeding sites.

## 2016-02-13 NOTE — Progress Notes (Addendum)
NEW PATIENT NOTE  RE: Jeanette Mcintosh MRN: [id number] DOB: 04/15/1941 [facility] [place] [address] [place] [address] Date of Office Visit: 02/13/2016  Dear [name], MD:  I had the pleasure of seeing Jeanette Mcintosh today in initial evaluation, as you recall-- Subjective:  Jeanette Mcintosh is a 75 y.o. female who presents today for Cough; Rhinitis; and Nasal Congestion  Assessment:   1. Moderate persistent asthma, Intermittent medication adherence s/p recent systemic steroid course with partial improvement (noted reversibility on in office spirometry).   2. Allergic rhinoconjunctivitis.   3.      Complex medical history on multiple medication regime--including diabetes mellitus, hypothyroidism and hypertension. 4.       Former smoker, recent discontinuation again. Plan:   Meds ordered this encounter  Medications  . triamcinolone (NASACORT AQ) 55 MCG/ACT AERO nasal inhaler    Sig: 1-2 sprays in each nostril daily    Dispense:  16.5 mL    Refill:  3  . budesonide-formoterol (SYMBICORT) 160-4.5 MCG/ACT inhaler    Sig: Inhale 2 puffs into the lungs 2 (two) times daily.    Dispense:  1 Inhaler    Refill:  2  1. Avoidance: Mold and Pollen 2. Antihistamine: Allegra 180mg  by mouth once daily for runny nose or itching. 3. Nasal Spray: Nasacort AQ 1-2 spray(s) each nostril once daily for stuffy nose or drainage. STOP FLONASE 4. Inhalers:  Rescue: ProAir 2 puffs every 4 hours as needed for cough or wheeze.       -May use 2 puffs 10-20 minutes prior to exercise.  Preventative: Consistently use Symbicort 152mcg 2 puffs twice daily (Rinse, gargle, and spit out after use). 5.  Continue to remain smoke free. 6. Nasal Saline wash each evening at shower time. 7. Follow up Visit: 4 weeks or sooner if needed.  HPI: "Jeanette Mcintosh" presents to the office in initial evaluation of 30 year history of recurring respiratory symptoms.  She describes rhinorrhea, congestion,  sneezing, and itchy watery eyes, postnasal drip, cough and wheeze.  Her symptoms are year-round but particularly prominent each spring and fall including in the recent years with chest congestion, tightness and shortness of breath.  She describes pollen, outdoors, strong odors, cigarette smoke and fluctuant weather patterns, as provoking factors to  her symptoms, which have included "sinus difficulty".  She had allergy testing several years ago received immunotherapy at her primary MD for less than a year.  She has received systemic steroids 3 times this year, (last 10 days ago) noting nocturnal and even daily symptoms.  No reflux, ED visits or hospitalizations.  She is not sure that Flonase has been beneficial nor relief from Zyrtec or Claritin, though Allegra seems beneficial.  In the last month with her diagnosis of bronchitis, she started Symbicort, a few days a week which is beneficial as chest symptoms have improved significantly, though still scheduling albuterol a few days a week as well.  Medical History: Past Medical History  Diagnosis Date  . Arthritis   . Blood transfusion without reported diagnosis   . Cancer ([place])   . Diabetes mellitus without complication ([place])   . GERD (gastroesophageal reflux disease)   . Hyperlipidemia   . Hypertension   . Thyroid disease   . Irritable bowel syndrome   . IDDM (insulin dependent diabetes mellitus) ([place])   . PAF (paroxysmal atrial fibrillation) ([place]) 2007  . Asthma   . Eczema    Surgical History: Past Surgical History  Procedure  Laterality Date  . Trigger finger release  1990's    Several surgeries.  . Hemorrhoid surgery  1990's    x2  . Cataract extraction  2002  . Total hip arthroplasty  2012    Left  . Total hip arthroplasty  2002    Right  . Appendectomy  1971  . Abdominal hysterectomy  1971    Partial  . US echocardiography  01/20/12  . Renal doppler  12/12/06  . Cholecystectomy, laparoscopic  2011   Family History: Family  History  Problem Relation Age of Onset  . Basal cell carcinoma Father   . Dementia Father   . Parkinson's disease Father   . Cancer Other     All (5) had Hysterectomies and high dose Estrogen in 1950's were all diagnosed with Breast Cancer.  . Glaucoma Mother   . Allergic rhinitis Mother   . Parkinson's disease Paternal Grandmother    Social History: Social History  . Marital Status: Divorced    Spouse Name: N/A  . Number of Children: 6  . Years of Education: N/A   Social History Main Topics  . Smoking status: Former Smoker    Types: Cigarettes    Start date: 09/14/1950    Quit date: 09/13/2013  . Smokeless tobacco: Never Used     Comment: Pt has stopped and started smoking muliple times over the years.  Amount varied during the years.  . Alcohol Use: 0.0 oz/week    0 Standard drinks or equivalent per week     Comment: 1/Evening  . Drug Use: No  . Sexual Activity: Not on file   Social History Narrative  Jeanette Mcintosh is at home with her 64 year old grandson.  Jeanette Mcintosh has a current medication list which includes the following prescription(s): albuterol neb, symbicort, cholecalciferol, clonazepam, fluticasone, folic acid, lantus solostar, latanoprost, levothyroxine, losartan, metoprolol, phenylephrine hcl, proair hfa, rutin, vitamin a.  Drug Allergies: Allergies  Allergen Reactions  . Morphine And Related Nausea Only  . Phenergan [Promethazine Hcl]     Restless leg  . Ambien [Zolpidem]   . Benadryl [Diphenhydramine] Other (See Comments)    Restless leg  . Dicyclomine Other (See Comments)    constipation  . Erythromycin Nausea And Vomiting  . Ivp Dye [Iodinated Diagnostic Agents] Nausea And Vomiting  . Ondansetron   . Propoxyphene Other (See Comments)  . Statins   . Vitamin E Other (See Comments)    Hot flashes   Environmental History: [place] lives in a 75 year old house for 16 years with vinyl floors, with central heat and air; stuffed mattress, non-feather  pillow/comforter with bedroom humidifier, indoor cats without smokers.   Review of Systems  Constitutional: Negative for fever.  HENT: Positive for congestion. Negative for ear discharge and nosebleeds.   Eyes: Negative for pain, discharge and redness.       Corrective eyeglasses lenses.  Respiratory: Positive for cough. Negative for hemoptysis and stridor.        History of pneumonia at age 22 years and bronchitis.  Gastrointestinal: Negative for vomiting, diarrhea, constipation and blood in stool.  Musculoskeletal: Negative for joint pain and falls.  Skin: Negative for itching and rash.  Neurological: Negative for seizures.  Endo/Heme/Allergies: Positive for environmental allergies. Does not bruise/bleed easily.       Denies sensitivity to NSAIDs, stinging insects, foods, latex, and jewelry.  Psychiatric/Behavioral: The patient is not nervous/anxious.   Immunological: No chronic or recurring infections. Objective:   Filed Vitals:   02/13/16 [id number]  BP: 122/78  Pulse: 68  Temp: 98.5 F (36.9 C)  Resp: 16   SpO2 Readings from Last 1 Encounters:  02/13/16 96%   Physical Exam  Constitutional: She is well-developed, well-nourished, and in no distress.  HENT:  Head: Atraumatic.  Right Ear: Tympanic membrane and ear canal normal.  Left Ear: Tympanic membrane and ear canal normal.  Nose: Mucosal edema present. No rhinorrhea. No epistaxis.  Mouth/Throat: Oropharynx is clear and moist and mucous membranes are normal. No oropharyngeal exudate, posterior oropharyngeal edema or posterior oropharyngeal erythema.  Eyes: Conjunctivae are normal.  Neck: Neck supple.  Cardiovascular: Normal rate, S1 normal and S2 normal.   No murmur heard. Pulmonary/Chest: Effort normal. She has no wheezes. She has no rhonchi. She has no rales.  Post Xopenex/Atrovent: Improved aeration without adventitious breath sounds.  Patient reports improved.  Abdominal: Soft. Normal appearance and bowel sounds are  normal.  Musculoskeletal: She exhibits no edema.  Lymphadenopathy:    She has no cervical adenopathy.  Neurological: She is alert.  Skin: Skin is warm and intact. No rash noted. No cyanosis. Nails show no clubbing.   Diagnostics: Spirometry:  FVC 2.16--82%, FEV1 1.42--69%, FEF 25-75 % 0.71--37%; postbronchodilator improvement, FVC 2.54---96%, FEV1 1.74---85%, FEF 25-75% 0.83--43%.   ACT=13 (completed Prednisone 10 days ago and recent dual controller initiation).  Skin testing:  Mild reactivity to selected mold species and Johnson grass pollen.    Roselyn M. Ishmael Holter, MD   cc: Philis Fendt, MD

## 2016-02-27 ENCOUNTER — Encounter: Payer: Self-pay | Admitting: Allergy and Immunology

## 2016-03-18 ENCOUNTER — Encounter: Payer: Self-pay | Admitting: Allergy and Immunology

## 2016-03-18 ENCOUNTER — Ambulatory Visit (INDEPENDENT_AMBULATORY_CARE_PROVIDER_SITE_OTHER): Payer: Medicare Other | Admitting: Allergy and Immunology

## 2016-03-18 VITALS — BP 110/66 | HR 62 | Temp 98.3°F | Resp 12 | Ht 62.0 in | Wt 145.8 lb

## 2016-03-18 DIAGNOSIS — J4540 Moderate persistent asthma, uncomplicated: Secondary | ICD-10-CM | POA: Diagnosis not present

## 2016-03-18 DIAGNOSIS — J309 Allergic rhinitis, unspecified: Secondary | ICD-10-CM

## 2016-03-18 DIAGNOSIS — H1010 Acute atopic conjunctivitis, unspecified eye: Secondary | ICD-10-CM | POA: Diagnosis not present

## 2016-03-18 MED ORDER — ALBUTEROL SULFATE HFA 108 (90 BASE) MCG/ACT IN AERS: 2.0000 | INHALATION_SPRAY | RESPIRATORY_TRACT | Status: DC | PRN

## 2016-03-18 MED ORDER — BUDESONIDE-FORMOTEROL FUMARATE 160-4.5 MCG/ACT IN AERO: 2.0000 | INHALATION_SPRAY | Freq: Two times a day (BID) | RESPIRATORY_TRACT | Status: DC

## 2016-03-18 NOTE — Progress Notes (Signed)
FOLLOW UP NOTE  RE: WAKEELAH BRYNER MRN: [id number] DOB: May 11, 1941 [facility] [place] [address] [place] [address] Date of Office Visit: 03/18/2016  Subjective:  Jeanette Mcintosh is a 75 y.o. female who presents today for Follow-up  Assessment:   1. Moderate persistent asthma, improved control.    2. Allergic rhinoconjunctivitis.   3.      Former smoker. Plan:   Meds ordered this encounter  Medications  . albuterol (PROAIR HFA) 108 (90 Base) MCG/ACT inhaler    Sig: Inhale 2 puffs into the lungs every 4 (four) hours as needed for wheezing or shortness of breath.    Dispense:  1 Inhaler    Refill:  3  . DISCONTD: budesonide-formoterol (SYMBICORT) 160-4.5 MCG/ACT inhaler    Sig: Inhale 2 puffs into the lungs 2 (two) times daily.    Dispense:  1 Inhaler    Refill:  2  . budesonide-formoterol (SYMBICORT) 160-4.5 MCG/ACT inhaler    Sig: Inhale 2 puffs into the lungs 2 (two) times daily.    Dispense:  1 Inhaler    Refill:  2  1.  Continue to remain smoke free. 2.  Continue Nasacort AQ 1-2 sprays each morning. 3.  Add Patanase 1-2 sprays each evening for congestion/postnasal drip, with goal of minimizing decongestants. 4.  Saline nasal wash prior to medicated nasal sprays and as needed. 5.  Continue Symbicort and Allegra daily. 6.  Receive influenza vaccine fall through primary MD. 7.  Follow-up in November/December or sooner if needed.  HPI:  Jeanette Mcintosh returns to the office in follow-up of asthma and allergic rhinoconjunctivitis.  She is doing significantly better since her visit in June and is pleased with her medication regime.  She has not required any albuterol and feels sleep and activity are without difficulty, no nocturnal awakenings.  She has noted occasional postnasal drip, throat clearing/nasal congestion in the evening, but no chest congestion/tightness, cough, wheeze or difficulty breathing.  She feels her medication regime is beneficial  prefers to continue Allegra, Symbicort and Nasacort.  Denies ED or urgent care visits, prednisone or antibiotic courses.  Jeanette Mcintosh has a current medication list which includes the following prescription(s): accu-chek aviva plus, albuterol, aspirin, b complex vitamins, bd insulin syringe ultrafine, benzonatate, budesonide-formoterol, bupropion, chlorthalidone, cholecalciferol, clonazepam, fexofenadine, folic acid, lantus solostar, latanoprost, levothyroxine, losartan, meloxicam, metoprolol, phenylephrine hcl, rutin, triamcinolone, vitamin a.   Drug Allergies: Allergies  Allergen Reactions  . Morphine And Related Nausea Only  . Phenergan [Promethazine Hcl]     Restless leg  . Ambien [Zolpidem]   . Benadryl [Diphenhydramine] Other (See Comments)    Restless leg  . Dicyclomine Other (See Comments)    constipation  . Erythromycin Nausea And Vomiting  . Ivp Dye [Iodinated Diagnostic Agents] Nausea And Vomiting  . Ondansetron   . Propoxyphene Other (See Comments)  . Statins   . Vitamin E Other (See Comments)    Hot flashes   Objective:   Filed Vitals:   03/18/16 1615  BP: 110/66  Pulse: 62  Temp: 98.3 F (36.8 C)  Resp: 12   SpO2 Readings from Last 1 Encounters:  03/18/16 96%   Physical Exam  Constitutional: She is well-developed, well-nourished, and in no distress.  HENT:  Head: Atraumatic.  Right Ear: Tympanic membrane and ear canal normal.  Left Ear: Tympanic membrane and ear canal normal.  Nose: Mucosal edema present. No rhinorrhea. No epistaxis.  Mouth/Throat: Oropharynx is clear and moist and  mucous membranes are normal. No oropharyngeal exudate, posterior oropharyngeal edema or posterior oropharyngeal erythema.  Neck: Neck supple.  Cardiovascular: Normal rate, S1 normal and S2 normal.   No murmur heard. Pulmonary/Chest: Effort normal. She has no wheezes. She has no rhonchi. She has no rales.  Lymphadenopathy:    She has no cervical adenopathy.   Diagnostics:  Spirometry:  FVC 2.53---100%, FEV1 1.77--91%.      Demitrios Molyneux M. Ishmael Holter, MD  cc: Philis Fendt, MD

## 2016-03-18 NOTE — Patient Instructions (Addendum)
   Continue to remain smoke free.  Continue Nasacort AQ 1-2 sprays each morning.  Add Patanase 1-2 sprays each evening for congestion/postnasal drip.  Saline nasal wash prior to medication nasal sprays.  Continue Symbicort and Allegra daily.  Receive influenza vaccine fall through primary M.D.  Follow-up in November/December or sooner if needed.

## 2016-03-22 ENCOUNTER — Telehealth: Payer: Self-pay | Admitting: Allergy and Immunology

## 2016-03-22 MED ORDER — OLOPATADINE HCL 0.6 % NA SOLN: NASAL | Status: DC

## 2016-03-22 NOTE — Telephone Encounter (Signed)
Pt called and said that dr hicks was going to call in a nose spray but was not called in. [facility]  [address]  [place] . [phone].

## 2016-03-22 NOTE — Telephone Encounter (Signed)
Left message for patient advising we would send in Patanase to pharmacy

## 2016-03-23 ENCOUNTER — Other Ambulatory Visit: Payer: Self-pay | Admitting: *Deleted

## 2016-03-23 MED ORDER — AZELASTINE HCL 0.15 % NA SOLN: 1.0000 | Freq: Two times a day (BID) | NASAL | Status: DC

## 2016-03-30 ENCOUNTER — Telehealth: Payer: Self-pay | Admitting: *Deleted

## 2016-03-30 NOTE — Telephone Encounter (Signed)
Already rx sent for azelastine on 7/11

## 2016-03-30 NOTE — Telephone Encounter (Signed)
Please send Astepro 1 spray each nostril once -twice daily.

## 2016-03-30 NOTE — Telephone Encounter (Signed)
Patient's insurance will not cover generic Patanase. They prefer Azelastine 0.1% (Astelin) or Azelastine 0.15% (Astepro). Please advise

## 2016-04-08 ENCOUNTER — Telehealth: Payer: Self-pay | Admitting: Allergy and Immunology

## 2016-04-08 NOTE — Telephone Encounter (Signed)
EXPLAINED THAT THIS WAS HER CO INS - SHE UNDERSTOOD

## 2016-10-08 DIAGNOSIS — E119 Type 2 diabetes mellitus without complications: Secondary | ICD-10-CM | POA: Diagnosis not present

## 2016-10-08 DIAGNOSIS — I480 Paroxysmal atrial fibrillation: Secondary | ICD-10-CM | POA: Diagnosis not present

## 2016-10-08 DIAGNOSIS — J302 Other seasonal allergic rhinitis: Secondary | ICD-10-CM | POA: Diagnosis not present

## 2016-10-08 DIAGNOSIS — I6529 Occlusion and stenosis of unspecified carotid artery: Secondary | ICD-10-CM | POA: Diagnosis not present

## 2016-10-08 DIAGNOSIS — H3530 Unspecified macular degeneration: Secondary | ICD-10-CM | POA: Diagnosis not present

## 2016-10-08 DIAGNOSIS — H409 Unspecified glaucoma: Secondary | ICD-10-CM | POA: Diagnosis not present

## 2016-10-08 DIAGNOSIS — E784 Other hyperlipidemia: Secondary | ICD-10-CM | POA: Diagnosis not present

## 2016-10-08 DIAGNOSIS — Z6825 Body mass index (BMI) 25.0-25.9, adult: Secondary | ICD-10-CM | POA: Diagnosis not present

## 2016-10-08 DIAGNOSIS — E038 Other specified hypothyroidism: Secondary | ICD-10-CM | POA: Diagnosis not present

## 2016-10-08 DIAGNOSIS — K589 Irritable bowel syndrome without diarrhea: Secondary | ICD-10-CM | POA: Diagnosis not present

## 2016-11-09 ENCOUNTER — Inpatient Hospital Stay (HOSPITAL_COMMUNITY)
Admission: EM | Admit: 2016-11-09 | Discharge: 2016-11-11 | DRG: 872 | Disposition: A | Discharge status: Home / Self Care | Payer: PPO | Attending: Nephrology | Admitting: Nephrology

## 2016-11-09 ENCOUNTER — Encounter (HOSPITAL_COMMUNITY): Payer: Self-pay

## 2016-11-09 ENCOUNTER — Emergency Department (HOSPITAL_COMMUNITY): Payer: PPO

## 2016-11-09 DIAGNOSIS — Z91041 Radiographic dye allergy status: Secondary | ICD-10-CM

## 2016-11-09 DIAGNOSIS — N189 Chronic kidney disease, unspecified: Secondary | ICD-10-CM | POA: Diagnosis not present

## 2016-11-09 DIAGNOSIS — R52 Pain, unspecified: Secondary | ICD-10-CM | POA: Diagnosis not present

## 2016-11-09 DIAGNOSIS — R7989 Other specified abnormal findings of blood chemistry: Secondary | ICD-10-CM

## 2016-11-09 DIAGNOSIS — Z7951 Long term (current) use of inhaled steroids: Secondary | ICD-10-CM | POA: Diagnosis not present

## 2016-11-09 DIAGNOSIS — Z79899 Other long term (current) drug therapy: Secondary | ICD-10-CM | POA: Diagnosis not present

## 2016-11-09 DIAGNOSIS — I483 Typical atrial flutter: Secondary | ICD-10-CM | POA: Diagnosis present

## 2016-11-09 DIAGNOSIS — J101 Influenza due to other identified influenza virus with other respiratory manifestations: Secondary | ICD-10-CM | POA: Diagnosis present

## 2016-11-09 DIAGNOSIS — Z66 Do not resuscitate: Secondary | ICD-10-CM | POA: Diagnosis present

## 2016-11-09 DIAGNOSIS — Z87891 Personal history of nicotine dependence: Secondary | ICD-10-CM | POA: Diagnosis not present

## 2016-11-09 DIAGNOSIS — K219 Gastro-esophageal reflux disease without esophagitis: Secondary | ICD-10-CM | POA: Diagnosis present

## 2016-11-09 DIAGNOSIS — I10 Essential (primary) hypertension: Secondary | ICD-10-CM | POA: Diagnosis present

## 2016-11-09 DIAGNOSIS — N179 Acute kidney failure, unspecified: Secondary | ICD-10-CM

## 2016-11-09 DIAGNOSIS — R778 Other specified abnormalities of plasma proteins: Secondary | ICD-10-CM | POA: Diagnosis present

## 2016-11-09 DIAGNOSIS — E871 Hypo-osmolality and hyponatremia: Secondary | ICD-10-CM | POA: Diagnosis present

## 2016-11-09 DIAGNOSIS — E785 Hyperlipidemia, unspecified: Secondary | ICD-10-CM | POA: Diagnosis present

## 2016-11-09 DIAGNOSIS — Z885 Allergy status to narcotic agent status: Secondary | ICD-10-CM | POA: Diagnosis not present

## 2016-11-09 DIAGNOSIS — I248 Other forms of acute ischemic heart disease: Secondary | ICD-10-CM | POA: Diagnosis present

## 2016-11-09 DIAGNOSIS — I480 Paroxysmal atrial fibrillation: Secondary | ICD-10-CM | POA: Diagnosis present

## 2016-11-09 DIAGNOSIS — I4891 Unspecified atrial fibrillation: Secondary | ICD-10-CM | POA: Diagnosis not present

## 2016-11-09 DIAGNOSIS — Z794 Long term (current) use of insulin: Secondary | ICD-10-CM | POA: Diagnosis not present

## 2016-11-09 DIAGNOSIS — R109 Unspecified abdominal pain: Secondary | ICD-10-CM | POA: Diagnosis not present

## 2016-11-09 DIAGNOSIS — E1121 Type 2 diabetes mellitus with diabetic nephropathy: Secondary | ICD-10-CM | POA: Diagnosis present

## 2016-11-09 DIAGNOSIS — A419 Sepsis, unspecified organism: Principal | ICD-10-CM | POA: Diagnosis present

## 2016-11-09 DIAGNOSIS — Z90710 Acquired absence of both cervix and uterus: Secondary | ICD-10-CM

## 2016-11-09 DIAGNOSIS — J111 Influenza due to unidentified influenza virus with other respiratory manifestations: Secondary | ICD-10-CM | POA: Diagnosis present

## 2016-11-09 DIAGNOSIS — Z7982 Long term (current) use of aspirin: Secondary | ICD-10-CM

## 2016-11-09 DIAGNOSIS — K580 Irritable bowel syndrome with diarrhea: Secondary | ICD-10-CM | POA: Diagnosis present

## 2016-11-09 DIAGNOSIS — Z881 Allergy status to other antibiotic agents status: Secondary | ICD-10-CM

## 2016-11-09 DIAGNOSIS — E039 Hypothyroidism, unspecified: Secondary | ICD-10-CM | POA: Diagnosis present

## 2016-11-09 DIAGNOSIS — R002 Palpitations: Secondary | ICD-10-CM | POA: Diagnosis not present

## 2016-11-09 DIAGNOSIS — Z888 Allergy status to other drugs, medicaments and biological substances status: Secondary | ICD-10-CM

## 2016-11-09 DIAGNOSIS — Z96643 Presence of artificial hip joint, bilateral: Secondary | ICD-10-CM | POA: Diagnosis present

## 2016-11-09 DIAGNOSIS — E876 Hypokalemia: Secondary | ICD-10-CM | POA: Diagnosis present

## 2016-11-09 DIAGNOSIS — R748 Abnormal levels of other serum enzymes: Secondary | ICD-10-CM | POA: Diagnosis not present

## 2016-11-09 HISTORY — DX: Chronic kidney disease, unspecified: N18.9

## 2016-11-09 HISTORY — DX: Chronic kidney disease, unspecified: N17.9

## 2016-11-09 HISTORY — DX: Paroxysmal atrial fibrillation: I48.0

## 2016-11-09 LAB — URINALYSIS, ROUTINE W REFLEX MICROSCOPIC
Bilirubin Urine: NEGATIVE
Glucose, UA: NEGATIVE mg/dL
Hgb urine dipstick: NEGATIVE
Ketones, ur: NEGATIVE mg/dL
Nitrite: NEGATIVE
Protein, ur: NEGATIVE mg/dL
Specific Gravity, Urine: 1.025 (ref 1.005–1.030)
pH: 6 (ref 5.0–8.0)

## 2016-11-09 LAB — I-STAT CG4 LACTIC ACID, ED
Lactic Acid, Venous: 3.79 mmol/L (ref 0.5–1.9)
Lactic Acid, Venous: 1.02 mmol/L (ref 0.5–1.9)

## 2016-11-09 LAB — BASIC METABOLIC PANEL
Anion gap: 19 — ABNORMAL HIGH (ref 5–15)
BUN: 22 mg/dL — ABNORMAL HIGH (ref 6–20)
CO2: 23 mmol/L (ref 22–32)
Calcium: 9.7 mg/dL (ref 8.9–10.3)
Chloride: 89 mmol/L — ABNORMAL LOW (ref 101–111)
Creatinine, Ser: 1.67 mg/dL — ABNORMAL HIGH (ref 0.44–1.00)
GFR calc Af Amer: 33 mL/min — ABNORMAL LOW (ref >60)
GFR calc non Af Amer: 29 mL/min — ABNORMAL LOW (ref >60)
Glucose, Bld: 214 mg/dL — ABNORMAL HIGH (ref 65–99)
Potassium: 3.7 mmol/L (ref 3.5–5.1)
Sodium: 131 mmol/L — ABNORMAL LOW (ref 135–145)

## 2016-11-09 LAB — CBC
HCT: 47.4 % — ABNORMAL HIGH (ref 36.0–46.0)
Hemoglobin: 17.2 g/dL — ABNORMAL HIGH (ref 12.0–15.0)
MCH: 33.3 pg (ref 26.0–34.0)
MCHC: 36.3 g/dL — ABNORMAL HIGH (ref 30.0–36.0)
MCV: 91.7 fL (ref 78.0–100.0)
Platelets: 274 10*3/uL (ref 150–400)
RBC: 5.17 MIL/uL — ABNORMAL HIGH (ref 3.87–5.11)
RDW: 12.3 % (ref 11.5–15.5)
WBC: 6.6 10*3/uL (ref 4.0–10.5)

## 2016-11-09 LAB — INFLUENZA PANEL BY PCR (TYPE A & B)
Influenza A By PCR: POSITIVE — AB
Influenza B By PCR: NEGATIVE

## 2016-11-09 LAB — I-STAT TROPONIN, ED: Troponin i, poc: 0.18 ng/mL (ref 0.00–0.08)

## 2016-11-09 LAB — D-DIMER, QUANTITATIVE: D-Dimer, Quant: 0.51 ug/mL-FEU — ABNORMAL HIGH (ref 0.00–0.50)

## 2016-11-09 MED ORDER — SODIUM CHLORIDE 0.9 % IV BOLUS (SEPSIS)
1000.0000 mL | Freq: Once | INTRAVENOUS | Status: AC
  Administered 2016-11-09: 1000 mL via INTRAVENOUS

## 2016-11-09 MED ORDER — LOPERAMIDE HCL 2 MG PO CAPS
2.0000 mg | ORAL_CAPSULE | ORAL | Status: DC | PRN
  Administered 2016-11-09 – 2016-11-10 (×2): 2 mg via ORAL
  Filled 2016-11-09 (×2): qty 1

## 2016-11-09 MED ORDER — LORAZEPAM 2 MG/ML IJ SOLN
0.5000 mg | Freq: Once | INTRAMUSCULAR | Status: AC
  Administered 2016-11-09: 0.5 mg via INTRAVENOUS
  Filled 2016-11-09: qty 1

## 2016-11-09 MED ORDER — PIPERACILLIN-TAZOBACTAM 3.375 G IVPB 30 MIN
3.3750 g | Freq: Once | INTRAVENOUS | Status: AC
  Administered 2016-11-09: 3.375 g via INTRAVENOUS
  Filled 2016-11-09: qty 50

## 2016-11-09 MED ORDER — IOPAMIDOL (ISOVUE-370) INJECTION 76%
INTRAVENOUS | Status: AC
  Administered 2016-11-09: 100 mL
  Filled 2016-11-09: qty 100

## 2016-11-09 MED ORDER — DILTIAZEM LOAD VIA INFUSION
10.0000 mg | Freq: Once | INTRAVENOUS | Status: DC
  Filled 2016-11-09: qty 10

## 2016-11-09 MED ORDER — PIPERACILLIN-TAZOBACTAM 3.375 G IVPB
3.3750 g | Freq: Three times a day (TID) | INTRAVENOUS | Status: DC
  Filled 2016-11-09 (×2): qty 50

## 2016-11-09 MED ORDER — DILTIAZEM HCL 100 MG IV SOLR
5.0000 mg/h | INTRAVENOUS | Status: DC
  Filled 2016-11-09: qty 100

## 2016-11-09 MED ORDER — VANCOMYCIN HCL IN DEXTROSE 750-5 MG/150ML-% IV SOLN
750.0000 mg | INTRAVENOUS | Status: DC
  Filled 2016-11-09: qty 150

## 2016-11-09 MED ORDER — OSELTAMIVIR PHOSPHATE 30 MG PO CAPS
30.0000 mg | ORAL_CAPSULE | Freq: Every day | ORAL | Status: DC
  Administered 2016-11-09 – 2016-11-10 (×2): 30 mg via ORAL
  Filled 2016-11-09 (×2): qty 1

## 2016-11-09 MED ORDER — ASPIRIN EC 325 MG PO TBEC
325.0000 mg | DELAYED_RELEASE_TABLET | Freq: Once | ORAL | Status: AC
  Administered 2016-11-09: 325 mg via ORAL
  Filled 2016-11-09: qty 1

## 2016-11-09 MED ORDER — LORAZEPAM 0.5 MG PO TABS: 0.5000 mg | ORAL_TABLET | Freq: Once | ORAL | Status: DC

## 2016-11-09 MED ORDER — VANCOMYCIN HCL IN DEXTROSE 1-5 GM/200ML-% IV SOLN
1000.0000 mg | Freq: Once | INTRAVENOUS | Status: AC
  Administered 2016-11-09: 1000 mg via INTRAVENOUS
  Filled 2016-11-09: qty 200

## 2016-11-09 NOTE — Progress Notes (Signed)
Pharmacy Antibiotic Note  Jeanette Mcintosh is a 76 y.o. female admitted on 11/09/2016 with sepsis. Pt reports generalized body aches and palpitations, and lactate is elevated in the ED. Pharmacy has been consulted for vancomycin and Zosyn dosing.  Plan: -Vancomycin 1000mg  IV x1 then 750mg  IV q24hr -Zosyn 3.375g IV over 30 min x1 then 3.375g IV q8h EI -Monitor renal function, cultures, LOT -Obtain vancomycin level as indicated  Height: 5\' 3"  (160 cm) Weight: 135 lb (61.2 kg) IBW/kg (Calculated) : 52.4  Temp (24hrs), Avg:98.2 F (36.8 C), Min:98.2 F (36.8 C), Max:98.2 F (36.8 C)   Recent Labs Lab 11/09/16 1750 11/09/16 1833  WBC 6.6  --   CREATININE 1.67*  --   LATICACIDVEN  --  3.79*    Estimated Creatinine Clearance: 24.1 mL/min (by C-G formula based on SCr of 1.67 mg/dL (H)).    Allergies  Allergen Reactions  . Morphine And Related Nausea Only  . Phenergan [Promethazine Hcl]     Restless leg  . Ambien [Zolpidem]   . Benadryl [Diphenhydramine] Other (See Comments)    Restless leg  . Dicyclomine Other (See Comments)    constipation  . Erythromycin Nausea And Vomiting  . Ivp Dye [Iodinated Diagnostic Agents] Nausea And Vomiting  . Ondansetron   . Propoxyphene Other (See Comments)  . Statins   . Vitamin E Other (See Comments)    Hot flashes    Antimicrobials this admission: 2/27 Vancomycin >>  2/27 Zosyn >>   Dose adjustments this admission: none  Microbiology results: 2/27 BCx: IP 2/27 UCx: IP   Thank you for allowing pharmacy to be a part of this patient's care.  Arrie Senate, PharmD PGY-1 Pharmacy Resident Pager: [phone] 11/09/2016

## 2016-11-09 NOTE — ED Triage Notes (Signed)
Per Pt, Pt is coming from home with complaints of generalized body aches, chills, palpitations, and hypotension. Pt reports flu like symptoms on Sunday and then the heart symptoms starting today. Hx of Afib noted with patient.

## 2016-11-09 NOTE — ED Provider Notes (Signed)
[facility] DEPT Provider Note   CSN: [id number] Arrival date & time: 11/09/16  1734  History   Chief Complaint Chief Complaint  Patient presents with  . Generalized Body Aches  . Palpitations    HPI Jeanette Mcintosh is a 76 y.o. female.  HPI Jeanette Mcintosh is 76 year old female with history of atrial fibrillation, well-controlled diabetes and hypothyroidism who presents with generalized body aches and poor appetite.  Patient reports feeling unwell for the last 3 days. She also reports runny nose, congestion and subjective fever and intermittent palpitation at home. She did not check her temperature. She stated that she was not able to eat anything. However, she reports tolerating fluids well. She reports getting in and out of atrial fibrillation intermittently. She is on metoprolol but is not on any anticoagulation. She reports taking her metoprolol this morning.  She denies headache, vision changes, shortness of breath, chest pain, nausea, vomiting, diarrhea, dysuria, focal weakness or numbness.  Past Medical History:  Diagnosis Date  . Arthritis   . Asthma   . Blood transfusion without reported diagnosis   . Cancer ([place])   . Diabetes mellitus without complication ([place])   . Eczema   . GERD (gastroesophageal reflux disease)   . Hyperlipidemia   . Hypertension   . IDDM (insulin dependent diabetes mellitus) ([place])   . Irritable bowel syndrome   . PAF (paroxysmal atrial fibrillation) ([place]) 2007  . Thyroid disease     Patient Active Problem List   Diagnosis Date Noted  . IRRITABLE BOWEL SYNDROME 06/17/2008  . EPIGASTRIC PAIN 06/17/2008  . CHOLELITHIASIS, HX OF 06/17/2008  . DIVERTICULOSIS-COLON 05/31/2008  . GERD 05/06/2008  . DIARRHEA 05/06/2008    Past Surgical History:  Procedure Laterality Date  . ABDOMINAL HYSTERECTOMY  1971   Partial  . APPENDECTOMY  1971  . CATARACT EXTRACTION  2002  . CHOLECYSTECTOMY, LAPAROSCOPIC  2011  . HEMORRHOID SURGERY  1990's   x2  .  renal doppler  12/12/06  . TOTAL HIP ARTHROPLASTY  2012   Left  . TOTAL HIP ARTHROPLASTY  2002   Right  . TRIGGER FINGER RELEASE  1990's   Several surgeries.  . US ECHOCARDIOGRAPHY  01/20/12    OB History    No data available       Home Medications    Prior to Admission medications   Medication Sig Start Date End Date Taking? Authorizing Provider  ACCU-CHEK AVIVA PLUS test strip CHECK BLOOD SUGAR TID 07/21/15   Historical Provider, MD  albuterol (PROAIR HFA) 108 (90 Base) MCG/ACT inhaler Inhale 2 puffs into the lungs every 4 (four) hours as needed for wheezing or shortness of breath. 03/18/16   Roselyn Malachy Moan, MD  albuterol (PROVENTIL) (2.5 MG/3ML) 0.083% nebulizer solution Take 2.5 mg by nebulization every 6 (six) hours as needed for wheezing or shortness of breath.    Historical Provider, MD  aspirin 81 MG tablet Take 81 mg by mouth daily. Reported on 02/13/2016    Historical Provider, MD  Azelastine HCl 0.15 % SOLN Place 1-2 sprays into both nostrils 2 (two) times daily. 03/23/16   Roselyn Malachy Moan, MD  b complex vitamins tablet Take 1 tablet by mouth daily.    Historical Provider, MD  BD INSULIN SYRINGE ULTRAFINE 31G X 15/64" 0.5 ML MISC USE DAILY FOR INSULIN INJECTIONS AS DIRECTED 06/02/15   Historical Provider, MD  benzonatate (TESSALON) 100 MG capsule TK ONE C PO  Q 8 H PRN 07/21/15   Historical Provider,  MD  budesonide-formoterol (SYMBICORT) 160-4.5 MCG/ACT inhaler Inhale 2 puffs into the lungs 2 (two) times daily. 03/18/16   Roselyn Malachy Moan, MD  buPROPion (WELLBUTRIN SR) 150 MG 12 hr tablet Take 150 mg by mouth daily. Reported on 02/13/2016    Historical Provider, MD  chlorthalidone (HYGROTON) 25 MG tablet Take 25 mg by mouth daily.  09/15/12   Historical Provider, MD  Cholecalciferol (VITAMIN D PO) Take by mouth.    Historical Provider, MD  clonazePAM (KLONOPIN) 0.5 MG tablet Take 0.5 mg by mouth at bedtime.    Historical Provider, MD  fexofenadine (ALLEGRA ALLERGY) 180 MG tablet Take 180  mg by mouth daily.    Historical Provider, MD  FOLIC ACID PO Take by mouth.    Historical Provider, MD  LANTUS SOLOSTAR 100 UNIT/ML injection Inject 14 Units into the skin at bedtime.  09/15/12   Historical Provider, MD  latanoprost (XALATAN) 0.005 % ophthalmic solution Place 1 drop into both eyes at bedtime.  10/13/12   Historical Provider, MD  levothyroxine (SYNTHROID, LEVOTHROID) 137 MCG tablet Take 137 mcg by mouth daily.  09/25/12   Historical Provider, MD  losartan (COZAAR) 100 MG tablet Take 100 mg by mouth daily.  10/13/12   Historical Provider, MD  meloxicam (MOBIC) 7.5 MG tablet Take 7.5 mg by mouth daily. Reported on 02/13/2016 10/13/12   Historical Provider, MD  metoprolol (LOPRESSOR) 50 MG tablet  11/03/12   Historical Provider, MD  PHENYLEPHRINE HCL PO Take by mouth.    Historical Provider, MD  RUTIN PO Take by mouth.    Historical Provider, MD  triamcinolone (NASACORT AQ) 55 MCG/ACT AERO nasal inhaler 1-2 sprays in each nostril daily 02/13/16   Roselyn Malachy Moan, MD  VITAMIN A PO Take by mouth.    Historical Provider, MD    Family History Family History  Problem Relation Age of Onset  . Basal cell carcinoma Father   . Dementia Father   . Parkinson's disease Father   . Cancer Other     All (5) had Hysterectomies and high dose Estrogen in 1950's were all diagnosed with Breast Cancer.  . Glaucoma Mother   . Allergic rhinitis Mother   . Parkinson's disease Paternal Grandmother   . Eczema Son     Social History Social History  Substance Use Topics  . Smoking status: Former Smoker    Types: Cigarettes    Start date: 09/14/1950    Quit date: 09/13/2013  . Smokeless tobacco: Never Used     Comment: Pt has stopped and started smoking muliple times over the years.  Amount varied during the years.  . Alcohol use 0.0 oz/week     Comment: 1/Evening     Allergies   Morphine and related; Phenergan [promethazine hcl]; Ambien [zolpidem]; Benadryl [diphenhydramine]; Dicyclomine; Erythromycin;  Ivp dye [iodinated diagnostic agents]; Ondansetron; Propoxyphene; Statins; and Vitamin e   Review of Systems Review of Systems  Constitutional: Positive for fever. Negative for chills and diaphoresis.  HENT: Positive for congestion and rhinorrhea. Negative for ear pain and sore throat.   Eyes: Negative for pain and visual disturbance.  Respiratory: Negative for cough, chest tightness and shortness of breath.   Cardiovascular: Positive for palpitations. Negative for chest pain and leg swelling.  Gastrointestinal: Positive for abdominal pain. Negative for blood in stool, diarrhea, nausea and vomiting.  Genitourinary: Negative for dysuria and hematuria.  Musculoskeletal: Positive for myalgias.  Skin: Negative for color change and rash.  Neurological: Negative for seizures and syncope.  All other systems reviewed and are negative.  Physical Exam Updated Vital Signs BP (!) 81/64   Pulse 78   Temp 98.2 F (36.8 C) (Oral)   Resp 18   Ht 5\' 3"  (1.6 m)   Wt 61.2 kg   SpO2 98%   BMI 23.91 kg/m   Physical Exam GEN: appears well, no apparent distress. Head: normocephalic and atraumatic  Eyes: conjunctiva without injection, sclera anicteric Nares: no rhinorrhea, mild erythema  Oropharynx: without erythema or exudation HEM: negative for cervical or periauricular lymphadenopathies CVS: tachycardic to 140's, RR, s1 & s2 heard, no murmurs, no edema, cold extremities RESP: speaks in full sentence, no IWOB, good air movement bilaterally, CTAB GI: BS present & normal, soft, some tenderness to palpation over epigastric and periumbilical area, no guarding, no rebound, no mass GU: no suprapubic or CVA tenderness MSK: no focal tenderness or notable swelling in legs or calves SKIN: no apparent skin lesion NEURO: alert and oiented appropriately, no gross defecits  PSYCH: euthymic mood with congruent affect ED Treatments / Results  Labs (all labs ordered are listed, but only abnormal results are  displayed) Labs Reviewed  BASIC METABOLIC PANEL - Abnormal; Notable for the following:       Result Value   Sodium 131 (*)    Chloride 89 (*)    Glucose, Bld 214 (*)    BUN 22 (*)    Creatinine, Ser 1.67 (*)    GFR calc non Af Amer 29 (*)    GFR calc Af Amer 33 (*)    Anion gap 19 (*)    All other components within normal limits  CBC - Abnormal; Notable for the following:    RBC 5.17 (*)    Hemoglobin 17.2 (*)    HCT 47.4 (*)    MCHC 36.3 (*)    All other components within normal limits  D-DIMER, QUANTITATIVE (NOT AT [facility]) - Abnormal; Notable for the following:    D-Dimer, Quant 0.51 (*)    All other components within normal limits  I-STAT CG4 LACTIC ACID, ED - Abnormal; Notable for the following:    Lactic Acid, Venous 3.79 (*)    All other components within normal limits  I-STAT TROPOININ, ED - Abnormal; Notable for the following:    Troponin i, poc 0.18 (*)    All other components within normal limits  CULTURE, BLOOD (ROUTINE X 2)  CULTURE, BLOOD (ROUTINE X 2)  URINE CULTURE  URINALYSIS, ROUTINE W REFLEX MICROSCOPIC  INFLUENZA PANEL BY PCR (TYPE A & B)    EKG  EKG Interpretation None       Radiology Dg Chest Port 1 View  Result Date: 11/09/2016 CLINICAL DATA:  Generalized body pain and chills. EXAM: PORTABLE CHEST 1 VIEW COMPARISON:  Radiographs of July 27, 2013. FINDINGS: The heart size and mediastinal contours are within normal limits. Both lungs are clear. No pneumothorax or pleural effusion is noted. The visualized skeletal structures are unremarkable. IMPRESSION: No acute cardiopulmonary abnormality seen. Electronically Signed   By: Marijo Conception, M.D.   On: 11/09/2016 18:55    Procedures Procedures (including critical care time)  Medications Ordered in ED Medications  sodium chloride 0.9 % bolus 1,000 mL (1,000 mLs Intravenous New Bag/Given 11/09/16 1915)    And  sodium chloride 0.9 % bolus 1,000 mL (not administered)  piperacillin-tazobactam  (ZOSYN) IVPB 3.375 g (3.375 g Intravenous New Bag/Given 11/09/16 1912)  vancomycin (VANCOCIN) IVPB 1000 mg/200 mL premix (1,000 mg Intravenous New Bag/Given  11/09/16 1912)  aspirin EC tablet 325 mg (not administered)  piperacillin-tazobactam (ZOSYN) IVPB 3.375 g (not administered)  vancomycin (VANCOCIN) IVPB 750 mg/150 ml premix (not administered)  sodium chloride 0.9 % bolus 1,000 mL (1,000 mLs Intravenous New Bag/Given 11/09/16 1830)     Initial Impression / Assessment and Plan / ED Course  I have reviewed the triage vital signs and the nursing notes.  Pertinent labs & imaging results that were available during my care of the patient were reviewed by me and considered in my medical decision making (see chart for details).  Patient in A. fib with RVR on arrival to ED. EKG without ischemic changes. CXR without acute cardiopulmonary abnormalities. Troponin elevated to 0.18. She became hypotensive to 70s/50s while in ED. Lactic acid elevated to 3.79 concerning for sepsis with history of fever and chills.  Blood culture drawn and received IV boluses and antibiotics per sepsis protocol. CTA of abdomen/pelvis normal. She also have AKI and metabolic acidosis with anion gap likely due to dehydration and ketosis in the setting of poor by mouth intake. Influenza PCR pending.  Will admit patient for sepsis and ACS rule out.  Final Clinical Impressions(s) / ED Diagnoses   Final diagnoses:  Pain    New Prescriptions New Prescriptions   No medications on file     Mercy Riding, MD 11/09/16 2256    Mercy Riding, MD 11/09/16 2317    Mercy Riding, MD 11/10/16 1110    Duffy Bruce, MD 11/10/16 [id number]

## 2016-11-10 ENCOUNTER — Inpatient Hospital Stay (HOSPITAL_COMMUNITY): Payer: PPO

## 2016-11-10 DIAGNOSIS — N179 Acute kidney failure, unspecified: Secondary | ICD-10-CM

## 2016-11-10 DIAGNOSIS — I4891 Unspecified atrial fibrillation: Secondary | ICD-10-CM

## 2016-11-10 DIAGNOSIS — N189 Chronic kidney disease, unspecified: Secondary | ICD-10-CM

## 2016-11-10 LAB — CBC
HCT: 37.2 % (ref 36.0–46.0)
Hemoglobin: 13.4 g/dL (ref 12.0–15.0)
MCH: 33 pg (ref 26.0–34.0)
MCHC: 36 g/dL (ref 30.0–36.0)
MCV: 91.6 fL (ref 78.0–100.0)
Platelets: 206 10*3/uL (ref 150–400)
RBC: 4.06 MIL/uL (ref 3.87–5.11)
RDW: 12.5 % (ref 11.5–15.5)
WBC: 3.5 10*3/uL — ABNORMAL LOW (ref 4.0–10.5)

## 2016-11-10 LAB — GLUCOSE, CAPILLARY
Glucose-Capillary: 152 mg/dL — ABNORMAL HIGH (ref 65–99)
Glucose-Capillary: 114 mg/dL — ABNORMAL HIGH (ref 65–99)
Glucose-Capillary: 148 mg/dL — ABNORMAL HIGH (ref 65–99)
Glucose-Capillary: 117 mg/dL — ABNORMAL HIGH (ref 65–99)
Glucose-Capillary: 121 mg/dL — ABNORMAL HIGH (ref 65–99)

## 2016-11-10 LAB — BASIC METABOLIC PANEL
Anion gap: 6 (ref 5–15)
Anion gap: 10 (ref 5–15)
BUN: 22 mg/dL — ABNORMAL HIGH (ref 6–20)
BUN: 20 mg/dL (ref 6–20)
CO2: 23 mmol/L (ref 22–32)
CO2: 24 mmol/L (ref 22–32)
Calcium: 7.4 mg/dL — ABNORMAL LOW (ref 8.9–10.3)
Calcium: 8.2 mg/dL — ABNORMAL LOW (ref 8.9–10.3)
Chloride: 103 mmol/L (ref 101–111)
Chloride: 102 mmol/L (ref 101–111)
Creatinine, Ser: 1.33 mg/dL — ABNORMAL HIGH (ref 0.44–1.00)
Creatinine, Ser: 1.32 mg/dL — ABNORMAL HIGH (ref 0.44–1.00)
GFR calc Af Amer: 44 mL/min — ABNORMAL LOW (ref >60)
GFR calc Af Amer: 45 mL/min — ABNORMAL LOW (ref >60)
GFR calc non Af Amer: 38 mL/min — ABNORMAL LOW (ref >60)
GFR calc non Af Amer: 38 mL/min — ABNORMAL LOW (ref >60)
Glucose, Bld: 142 mg/dL — ABNORMAL HIGH (ref 65–99)
Glucose, Bld: 171 mg/dL — ABNORMAL HIGH (ref 65–99)
Potassium: 3.1 mmol/L — ABNORMAL LOW (ref 3.5–5.1)
Potassium: 2.9 mmol/L — ABNORMAL LOW (ref 3.5–5.1)
Sodium: 132 mmol/L — ABNORMAL LOW (ref 135–145)
Sodium: 136 mmol/L (ref 135–145)

## 2016-11-10 LAB — LACTIC ACID, PLASMA
Lactic Acid, Venous: 1.1 mmol/L (ref 0.5–1.9)
Lactic Acid, Venous: 1.6 mmol/L (ref 0.5–1.9)

## 2016-11-10 LAB — TSH: TSH: 0.189 u[IU]/mL — ABNORMAL LOW (ref 0.350–4.500)

## 2016-11-10 LAB — PROCALCITONIN: Procalcitonin: 0.15 ng/mL

## 2016-11-10 LAB — TROPONIN I: Troponin I: 0.4 ng/mL (ref <0.03)

## 2016-11-10 LAB — MAGNESIUM: Magnesium: 1.6 mg/dL — ABNORMAL LOW (ref 1.7–2.4)

## 2016-11-10 LAB — OSMOLALITY, URINE: Osmolality, Ur: 387 mOsm/kg (ref 300–900)

## 2016-11-10 LAB — ECHOCARDIOGRAM COMPLETE
Height: 62 in
Weight: 2179.91 oz

## 2016-11-10 LAB — SODIUM, URINE, RANDOM: Sodium, Ur: 56 mmol/L

## 2016-11-10 LAB — OSMOLALITY: Osmolality: 291 mOsm/kg (ref 275–295)

## 2016-11-10 LAB — MRSA PCR SCREENING: MRSA by PCR: NEGATIVE

## 2016-11-10 MED ORDER — LEVOTHYROXINE SODIUM 25 MCG PO TABS
137.0000 ug | ORAL_TABLET | Freq: Every day | ORAL | Status: DC
  Administered 2016-11-10: 137 ug via ORAL
  Filled 2016-11-10: qty 1

## 2016-11-10 MED ORDER — POTASSIUM CHLORIDE CRYS ER 20 MEQ PO TBCR
40.0000 meq | EXTENDED_RELEASE_TABLET | Freq: Once | ORAL | Status: AC
  Administered 2016-11-10: 40 meq via ORAL
  Filled 2016-11-10: qty 2

## 2016-11-10 MED ORDER — LATANOPROST 0.005 % OP SOLN
1.0000 [drp] | Freq: Every day | OPHTHALMIC | Status: DC
  Administered 2016-11-10 (×2): 1 [drp] via OPHTHALMIC
  Filled 2016-11-10: qty 2.5

## 2016-11-10 MED ORDER — BUPROPION HCL ER (SR) 150 MG PO TB12
150.0000 mg | ORAL_TABLET | Freq: Every day | ORAL | Status: DC
  Administered 2016-11-10 – 2016-11-11 (×2): 150 mg via ORAL
  Filled 2016-11-10 (×2): qty 1

## 2016-11-10 MED ORDER — ENOXAPARIN SODIUM 30 MG/0.3ML ~~LOC~~ SOLN
30.0000 mg | SUBCUTANEOUS | Status: DC
  Administered 2016-11-10 – 2016-11-11 (×2): 30 mg via SUBCUTANEOUS
  Filled 2016-11-10 (×2): qty 0.3

## 2016-11-10 MED ORDER — INSULIN ASPART 100 UNIT/ML ~~LOC~~ SOLN: 0.0000 [IU] | Freq: Every day | SUBCUTANEOUS | Status: DC

## 2016-11-10 MED ORDER — LATANOPROST 0.005 % OP SOLN
1.0000 [drp] | Freq: Every day | OPHTHALMIC | Status: DC
  Filled 2016-11-10: qty 2.5

## 2016-11-10 MED ORDER — POTASSIUM CHLORIDE CRYS ER 20 MEQ PO TBCR: 40.0000 meq | EXTENDED_RELEASE_TABLET | Freq: Two times a day (BID) | ORAL | Status: DC

## 2016-11-10 MED ORDER — BENZONATATE 100 MG PO CAPS
200.0000 mg | ORAL_CAPSULE | Freq: Three times a day (TID) | ORAL | Status: DC | PRN
  Administered 2016-11-10 – 2016-11-11 (×3): 200 mg via ORAL
  Filled 2016-11-10 (×3): qty 2

## 2016-11-10 MED ORDER — ACETAMINOPHEN 650 MG RE SUPP: 650.0000 mg | Freq: Four times a day (QID) | RECTAL | Status: DC | PRN

## 2016-11-10 MED ORDER — OSELTAMIVIR PHOSPHATE 30 MG PO CAPS
30.0000 mg | ORAL_CAPSULE | Freq: Two times a day (BID) | ORAL | Status: DC
  Administered 2016-11-10 – 2016-11-11 (×2): 30 mg via ORAL
  Filled 2016-11-10 (×3): qty 1

## 2016-11-10 MED ORDER — INSULIN ASPART 100 UNIT/ML ~~LOC~~ SOLN: 0.0000 [IU] | Freq: Three times a day (TID) | SUBCUTANEOUS | Status: DC

## 2016-11-10 MED ORDER — INSULIN GLARGINE 100 UNIT/ML ~~LOC~~ SOLN
7.0000 [IU] | Freq: Every day | SUBCUTANEOUS | Status: DC
  Administered 2016-11-10 (×2): 7 [IU] via SUBCUTANEOUS
  Filled 2016-11-10 (×3): qty 0.07

## 2016-11-10 MED ORDER — SODIUM CHLORIDE 0.9 % IV SOLN
INTRAVENOUS | Status: DC
  Administered 2016-11-10 – 2016-11-11 (×2): via INTRAVENOUS

## 2016-11-10 MED ORDER — ACETAMINOPHEN 325 MG PO TABS: 650.0000 mg | ORAL_TABLET | Freq: Four times a day (QID) | ORAL | Status: DC | PRN

## 2016-11-10 MED ORDER — METOPROLOL TARTRATE 25 MG PO TABS
25.0000 mg | ORAL_TABLET | Freq: Two times a day (BID) | ORAL | Status: DC
Start: 2016-11-10 — End: 2016-11-11
  Administered 2016-11-10 – 2016-11-11 (×3): 25 mg via ORAL
  Filled 2016-11-10 (×3): qty 1

## 2016-11-10 MED ORDER — SALINE SPRAY 0.65 % NA SOLN
1.0000 | NASAL | Status: DC | PRN
  Administered 2016-11-11: 1 via NASAL
  Filled 2016-11-10 (×2): qty 44

## 2016-11-10 MED ORDER — NITROFURANTOIN MACROCRYSTAL 100 MG PO CAPS
100.0000 mg | ORAL_CAPSULE | Freq: Four times a day (QID) | ORAL | Status: DC
  Administered 2016-11-10: 100 mg via ORAL
  Filled 2016-11-10 (×7): qty 1

## 2016-11-10 MED ORDER — MAGNESIUM SULFATE 2 GM/50ML IV SOLN: 2.0000 g | Freq: Once | INTRAVENOUS | Status: DC

## 2016-11-10 MED ORDER — MAGNESIUM SULFATE 2 GM/50ML IV SOLN
2.0000 g | Freq: Once | INTRAVENOUS | Status: AC
  Administered 2016-11-10: 2 g via INTRAVENOUS
  Filled 2016-11-10: qty 50

## 2016-11-10 MED ORDER — SODIUM CHLORIDE 0.9% FLUSH
3.0000 mL | Freq: Two times a day (BID) | INTRAVENOUS | Status: DC
  Administered 2016-11-10: 3 mL via INTRAVENOUS

## 2016-11-10 NOTE — Progress Notes (Signed)
Initial Nutrition Assessment  DOCUMENTATION CODES:   Not applicable  INTERVENTION:  - Snacks TID  NUTRITION DIAGNOSIS:   Inadequate oral intake related to poor appetite as evidenced by per patient/family report.  GOAL:   Patient will meet greater than or equal to 90% of their needs  MONITOR:   PO intake, Weight trends, I & O's  REASON FOR ASSESSMENT:   Malnutrition Screening Tool    ASSESSMENT:   76 y.o. female with a PMH of asthma, pAF not on anticoagulation, IDDM, HTN, and hypothyroidism who presents with fever, chills, and palpitations.   Pt reports weight loss but no extensive weight history is provided via chart, weight change from 7 months ago is not significant for time frame. Pt reports a UBW around 135 lbs and pt most recent weight is 136 lbs.  Pt reports not eating for 3 days PTA. Prior to this pt reports eating 2 small meals and small snacks or drinks/day, referenced about 1 cup serving at each meal. Pt reports consuming liquids and dairy products often such as milk, yogurt, homemade egg nog, 1/2 glass of half and half. Per chart review, no meal completion percentages recorded for pt.  Pt expressed dislike for nutritional supplement drinks but was very agreeable to consuming snacks.   Pt reports controlling her blood sugar very tightly and reports checking her blood sugar levels at least 2 times/day.  Labs reviewed; CBG (114-152), K (2.9), Magnesium (1.6) Medications reviewed  Nutrition-focused physical exam completed. Findings are mild/moderate muscle depletion, no fat depletion, and no edema. Predict depletion is associated with age-related sarcopenia and not malnutrition at this time.  Diet Order:  Diet heart healthy/carb modified Room service appropriate? Yes; Fluid consistency: Thin  Skin:  Reviewed, no issues  Last BM:  2/28  Height:   Ht Readings from Last 1 Encounters:  11/10/16 5\' 2"  (1.575 m)    Weight:   Wt Readings from Last 1  Encounters:  11/10/16 136 lb 3.9 oz (61.8 kg)    Ideal Body Weight:  50 kg  BMI:  Body mass index is 24.92 kg/m.  Estimated Nutritional Needs:   Kcal:  1500-1700  Protein:  75-85 grams  Fluid:  >/= 1.5 L/d  EDUCATION NEEDS:   Education needs addressed  [employer] Dietetic Intern

## 2016-11-10 NOTE — H&P (Signed)
History and Physical  Patient Name: Jeanette Mcintosh     [id number]    DOB: 03-04-1941    DOA: 11/09/2016 PCP: Philis Fendt, MD   Patient coming from: Home  Chief Complaint: Palpitations, malaise  HPI: Jeanette Mcintosh is a 76 y.o. female with a past medical history significant for asthma, pAF not on anticoagulation, IDDM, HTN, and hypothyroidism who presents with fever, chills, and palpitations.  The patient was in her usual state of health until about 3 days ago when she developed fevers, aches, chills, and abdominal discomfort. She had been at a party, and subsequently all of her friends have gotten sick with "the flu". Then this morning, she started to go into A. fib, had rapid heart rate all day long, new she was in A. fib, knows that her blood pressure was low, felt miserable, and so finally came to the emergency room when it didn't get better.  ED course: -Temp 98.2, heart rate 145, pulse oximetry and respirations normal, initial blood pressure 92/74 -Na 131, K 3.7, Cr 1.67 (baseline 1.1 6 years ago), WBC 6.6K, Hgb 17.2 -Lactate 3.79 -Initial ECG showed typical atrial flutter -Chest x-ray clear -Troponin 0.18 -D-dimer 0.51 -CT of the chest abdomen and pelvis, dissection protocol showed no dissection, no PE, no pneumonia, possible ileus only -Influenza PCR positive -She was given 30 cc/kg fluids, and around that time she spontaneously converted and her BP improved -Cultures were obtained, she was given vancomycin and Zosyn and TRH were asked to evaluate for admission    She has a past diagnosis of pAF.  Does not take anticoagulant for personal choice.  She feels that the "patient's at the warfarin clinic are the most pitiful people in the world".       ROS: Review of Systems  Constitutional: Positive for chills, fever and malaise/fatigue.  Respiratory: Positive for cough and shortness of breath.   Cardiovascular: Positive for palpitations. Negative for chest pain.    Gastrointestinal: Positive for abdominal pain and nausea.  Musculoskeletal: Positive for myalgias.  Neurological: Positive for dizziness and weakness.  All other systems reviewed and are negative.         Past Medical History:  Diagnosis Date  . Arthritis   . Asthma   . Blood transfusion without reported diagnosis   . Cancer ([place])   . Diabetes mellitus without complication ([place])   . Eczema   . GERD (gastroesophageal reflux disease)   . Hyperlipidemia   . Hypertension   . IDDM (insulin dependent diabetes mellitus) ([place])   . Irritable bowel syndrome   . PAF (paroxysmal atrial fibrillation) ([place]) 2007  . Thyroid disease     Past Surgical History:  Procedure Laterality Date  . ABDOMINAL HYSTERECTOMY  1971   Partial  . APPENDECTOMY  1971  . CATARACT EXTRACTION  2002  . CHOLECYSTECTOMY, LAPAROSCOPIC  2011  . HEMORRHOID SURGERY  1990's   x2  . renal doppler  12/12/06  . TOTAL HIP ARTHROPLASTY  2012   Left  . TOTAL HIP ARTHROPLASTY  2002   Right  . TRIGGER FINGER RELEASE  1990's   Several surgeries.  . US ECHOCARDIOGRAPHY  01/20/12    Social History: Patient lives with her grandson who is in high school.  The patient walks unasssited.  She still drives.  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  She is a former smoker.  Her POA is a friend.  Allergies  Allergen Reactions  . Morphine And Related Nausea Only  . Phenergan [Promethazine Hcl] Other (See Comments)    Restless leg  . Ambien [Zolpidem] Other (See Comments)    Unknown  . Benadryl [Diphenhydramine] Other (See Comments)    Restless leg  . Dicyclomine Other (See Comments)    constipation  . Erythromycin Nausea And Vomiting  . Ivp Dye [Iodinated Diagnostic Agents] Nausea And Vomiting  . Ondansetron Other (See Comments)    Unknown  . Propoxyphene Other (See Comments)    Unknown  . Statins Other (See Comments)    Unknown  . Vitamin E Other (See Comments)     Hot flashes    Family history: family history includes Allergic rhinitis in her mother; Basal cell carcinoma in her father; Cancer in her other; Dementia in her father; Eczema in her son; Glaucoma in her mother; Parkinson's disease in her father and paternal grandmother.  Prior to Admission medications   Medication Sig Start Date End Date Taking? Authorizing Provider  albuterol (PROAIR HFA) 108 (90 Base) MCG/ACT inhaler Inhale 2 puffs into the lungs every 4 (four) hours as needed for wheezing or shortness of breath. 03/18/16  Yes Roselyn Malachy Moan, MD  b complex vitamins tablet Take 1 tablet by mouth daily.   Yes Historical Provider, MD  benzonatate (TESSALON) 100 MG capsule TK ONE C PO  Q 8 H PRN 07/21/15  Yes Historical Provider, MD  buPROPion (WELLBUTRIN SR) 150 MG 12 hr tablet Take 150 mg by mouth daily. Reported on 02/13/2016   Yes Historical Provider, MD  cetirizine (ZYRTEC) 10 MG tablet Take 10 mg by mouth daily.   Yes Historical Provider, MD  chlorthalidone (HYGROTON) 25 MG tablet Take 12.5 mg by mouth daily.  09/15/12  Yes Historical Provider, MD  Cholecalciferol (VITAMIN D PO) Take 4,000 Units by mouth daily.    Yes Historical Provider, MD  FOLIC ACID PO Take [id number] mcg by mouth daily.    Yes Historical Provider, MD  LANTUS SOLOSTAR 100 UNIT/ML injection Inject 0-20 Units into the skin at bedtime. Per BS 09/15/12  Yes Historical Provider, MD  latanoprost (XALATAN) 0.005 % ophthalmic solution Place 1 drop into both eyes at bedtime.  10/13/12  Yes Historical Provider, MD  levothyroxine (SYNTHROID, LEVOTHROID) 137 MCG tablet Take 137 mcg by mouth daily.  09/25/12  Yes Historical Provider, MD  losartan (COZAAR) 100 MG tablet Take 100 mg by mouth daily.  10/13/12  Yes Historical Provider, MD  meloxicam (MOBIC) 7.5 MG tablet Take 7.5 mg by mouth daily as needed for pain. Reported on 02/13/2016 10/13/12  Yes Historical Provider, MD  metoprolol tartrate (LOPRESSOR) 25 MG tablet Take 25 mg by mouth 2 (two)  times daily.   Yes Historical Provider, MD  PHENYLEPHRINE HCL PO Take 10 mg by mouth at bedtime.    Yes Historical Provider, MD  RUTIN PO Take 500 mg by mouth at bedtime.    Yes Historical Provider, MD  triamcinolone (NASACORT AQ) 55 MCG/ACT AERO nasal inhaler 1-2 sprays in each nostril daily Patient taking differently: Place 1-2 sprays into the nose daily as needed (for congestion).  02/13/16  Yes Roselyn Malachy Moan, MD  VITAMIN A PO Take 1 capsule by mouth daily.    Yes Historical Provider, MD  albuterol (PROVENTIL) (2.5 MG/3ML) 0.083% nebulizer solution Take 2.5 mg by nebulization every 6 (six) hours as needed for wheezing or shortness of breath.    Historical Provider, MD  Azelastine HCl 0.15 % SOLN Place 1-2  sprays into both nostrils 2 (two) times daily. Patient not taking: Reported on 11/09/2016 03/23/16   Gean Quint, MD  budesonide-formoterol [facility]) 160-4.5 MCG/ACT inhaler Inhale 2 puffs into the lungs 2 (two) times daily. Patient not taking: Reported on 11/09/2016 03/18/16   Gean Quint, MD       Physical Exam: BP (!) 116/55 (BP Location: Right Arm)   Pulse 67   Temp 98.2 F (36.8 C) (Oral)   Resp 15   Ht 5\' 2"  (1.575 m)   Wt 61.8 kg (136 lb 3.9 oz)   SpO2 98%   BMI 24.92 kg/m  General appearance: Well-developed, elderly adult female, alert and in mild distress from malaise, but mentating well.   Eyes: Anicteric, conjunctiva pink, lids and lashes normal. PERRL.    ENT: No nasal deformity, discharge, epistaxis.  Hearing normal. OP dry without lesions.   Neck: No neck masses.  Trachea midline.  No thyromegaly/tenderness. Lymph: No cervical or supraclavicular lymphadenopathy. Skin: Warm and dry.  No jaundice.  No suspicious rashes or lesions. Cardiac: RRR, nl S1-S2, no murmurs appreciated.  Capillary refill is brisk.  JVP normal.  No LE edema.  Radial and DP pulses 2+ and symmetric. Respiratory: Normal respiratory rate and rhythm.  CTAB without rales or wheezes. Abdomen:  Abdomen soft.  No TTP. No ascites, distension, hepatosplenomegaly.   MSK: No deformities or effusions.  No cyanosis or clubbing. Neuro: Cranial nerves normal.  Sensation intact to light touch. Speech is fluent.  Muscle strength normal.    Psych: Sensorium intact and responding to questions, attention normal.  Behavior appropriate.  Affect normal.  Judgment and insight appear normal.     Labs on Admission:  I have personally reviewed following labs and imaging studies: CBC:  Recent Labs Lab 11/09/16 1750 11/10/16 0300  WBC 6.6 3.5*  HGB 17.2* 13.4  HCT 47.4* 37.2  MCV 91.7 91.6  PLT 274 [id number]   Basic Metabolic Panel:  Recent Labs Lab 11/09/16 1750 11/09/16 2300 11/10/16 0300  NA 131* 132* 136  K 3.7 3.1* 2.9*  CL 89* 103 102  CO2 23 23 24   GLUCOSE 214* 142* 171*  BUN 22* 22* 20  CREATININE 1.67* 1.33* 1.32*  CALCIUM 9.7 7.4* 8.2*  MG  --   --  1.6*   GFR: Estimated Creatinine Clearance: 31.9 mL/min (by C-G formula based on SCr of 1.32 mg/dL (H)).  Liver Function Tests: No results for input(s): AST, ALT, ALKPHOS, BILITOT, PROT, ALBUMIN in the last 168 hours. No results for input(s): LIPASE, AMYLASE in the last 168 hours. No results for input(s): AMMONIA in the last 168 hours. Coagulation Profile: No results for input(s): INR, PROTIME in the last 168 hours. Cardiac Enzymes:  Recent Labs Lab 11/09/16 2300  TROPONINI 0.40*   BNP (last 3 results) No results for input(s): PROBNP in the last 8760 hours. HbA1C: No results for input(s): HGBA1C in the last 72 hours. CBG:  Recent Labs Lab 11/10/16 0231  GLUCAP 152*   Lipid Profile: No results for input(s): CHOL, HDL, LDLCALC, TRIG, CHOLHDL, LDLDIRECT in the last 72 hours. Thyroid Function Tests:  Recent Labs  11/10/16 0249  TSH 0.189*   Anemia Panel: No results for input(s): VITAMINB12, FOLATE, FERRITIN, TIBC, IRON, RETICCTPCT in the last 72 hours. Sepsis Labs: Lactate 3.79 Invalid input(s):  PROCALCITONIN, LACTICIDVEN No results found for this or any previous visit (from the past 240 hour(s)).       Radiological Exams on Admission: Personally reviewed CXR shows  no focal opacity or pneumonia, CT dissection protocop report reviewed: Dg Chest Port 1 View  Result Date: 11/09/2016 CLINICAL DATA:  Generalized body pain and chills. EXAM: PORTABLE CHEST 1 VIEW COMPARISON:  Radiographs of July 27, 2013. FINDINGS: The heart size and mediastinal contours are within normal limits. Both lungs are clear. No pneumothorax or pleural effusion is noted. The visualized skeletal structures are unremarkable. IMPRESSION: No acute cardiopulmonary abnormality seen. Electronically Signed   By: Marijo Conception, M.D.   On: 11/09/2016 18:55   Ct Angio Abd/pel W And/or Wo Contrast  Result Date: 11/09/2016 CLINICAL DATA:  Nasal congestion, cough and fever starting Sunday. Irritable bowel syndrome, hypertension and diabetes. Abdominal pain. Evaluate for ischemia. EXAM: CTA ABDOMEN AND PELVIS WITHOUT AND WITH CONTRAST TECHNIQUE: Multidetector CT imaging of the abdomen and pelvis was performed using the standard protocol during bolus administration of intravenous contrast. Multiplanar reconstructed images and MIPs were obtained and reviewed to evaluate the vascular anatomy. CONTRAST:  80 cc Isovue 370 IV COMPARISON:  None. FINDINGS: VASCULAR Aorta: Normal caliber aorta. Up Mild atherosclerosis without aneurysm nor dissection. Minimal calcification at its origin without significant stenosis. Celiac: Patent without evidence of aneurysm, dissection, vasculitis or significant stenosis. SMA: Patent without evidence of aneurysm, dissection, vasculitis or significant stenosis. Renals: Single renal arteries to both kidneys with calcifications at their origin. No significant stenosis. No evidence vasculitis or fibromuscular dysplasia. IMA: Patent without evidence of aneurysm, dissection, vasculitis or significant stenosis.  Inflow: Patent without evidence of aneurysm, dissection, vasculitis or significant stenosis. Proximal Outflow: Bilateral common femoral and visualized portions of the superficial and profunda femoral arteries are patent without evidence of aneurysm, dissection, or vasculitis. Moderate calcific plaque along the common femoral arteries bilaterally. Veins: No obvious venous abnormality within the limitations of this arterial phase study. Review of the MIP images confirms the above findings. NON-VASCULAR Lower chest: Small to moderate-sized hiatal hernia. Bibasilar dependent atelectasis. Normal sized cardiac chambers without pericardial effusion. Hepatobiliary: Cholecystectomy with mild intra and extrahepatic ductal dilatation which can be seen in the setting of prior cholecystectomy. No space-occupying mass of the liver. No portal venous gas. Pancreas: Unremarkable. No pancreatic ductal dilatation or surrounding inflammatory changes. 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 No splenomegaly or focal mass. Adrenals/Urinary Tract: Normal bilateral adrenal glands and kidneys without obstructive uropathy. Urinary bladder is physiologically distended. Stomach/Bowel: Nondistended stomach. Normal small bowel rotation. Mild to moderate fluid-filled distention of mid to distal small bowel including colon. Findings may represent a mild ileus. No wall thickening, pneumatosis nor mechanical obstruction identified. No findings of bowel ischemia. Sigmoid diverticulosis. Appendectomy. Lymphatic: No lymphadenopathy Reproductive: Hysterectomy.  No adnexal mass. Other: No abdominal wall hernia or abnormality. No abdominopelvic ascites. Musculoskeletal: Bilateral hip arthroplasties. Thoracolumbar spondylosis with multilevel degenerative disc disease of the visualized thoracolumbar spine. IMPRESSION: VASCULAR Aortic atherosclerosis without aneurysm or dissection. Patent branch vessels without significant  stenosis identified. 7 mm calcified splenic artery aneurysm. NON-VASCULAR No findings of bowel ischemia. Mild to moderate fluid-filled distention of small and large bowel loops potentially representing an ileus. No findings of mechanical bowel obstruction. Status post cholecystectomy, appendectomy and hysterectomy. Mild moderate sized hiatal hernia. Electronically Signed   By: Ashley Royalty M.D.   On: 11/09/2016 22:25    EKG: Independently reviewed. Rate 148 with Aflutter intiially.  Repeat shows rate 75, QTc 465, NSR.  Echocardiogram 2017: EF 60-65% Grade I DD  No valvular disease        Assessment/Plan Principal Problem:  Sepsis ([place]) Active Problems:   Type 2 diabetes mellitus with diabetic nephropathy, with long-term current use of insulin (HCC)   Essential hypertension   Paroxysmal atrial fibrillation (HCC)   Acute kidney injury superimposed on chronic kidney disease (HCC)   Hyponatremia   Elevated troponin   Influenza  1. Sepsis:  Suspected source unclear initially, may be all influenza A. Organism unknown.   Patient meets criteria given tachycardia, tachypnea, and evidence of organ dysfunction.  Lactate 3.79 mmol/L and repeat ordered within 6 hours.  This patient is not at high risk of poor outcomes with a qSOFA score of 1.  Antibiotics delivered in the ED.    -Sepsis bundle utilized:  -Blood and urine cultures drawn  -30 ml/kg bolus given in ED, will repeat lactic acid  -Antibiotics: vancomycin, Zosyn and tamiflu  -Repeat renal function and complete blood count in AM  -Code SEPSIS called to E-link  -Check procalcitonin, and de-escalate antibiotics as able       2. Elevated troponin:  Suspect demand NSTEMI. -Trend troponin -Monitor on telemetry  3. Hyponatremia:  Insetting of hypovolemia. -Check urine sodium and osmolarities  4. Chronic kidney disease versus AKI:  Last baseline was 6 years ago, patient has known allergies CKD. -Trend BMP -Hold ARB and  nephrotoxins  5. Paroxysmal atrial fibrillation:  CHADS2-VASc 5.  Anticoagulation indicated.  Discussed risks and benefits.  HASBLED 2.  Not on anticoagulation because of patient preference, although she is vague about this.  -Continue metoprolol -Consult to CM re: coverage of apixaban or rivaroxaban -Check TSH, mag  6. Hypertension:  -Hold losartan and chlorthalidon until hemodynamics clearer  7. Insulin-dependent diabetes:  -Continue glargine 7 units nightly (she takes on sliding scale) -SSI with meals  8. Hypothyroidism: -Check TSH -Continue levothyroxine  9. Other medications: -Continue clonazepam, bupropion      DVT prophylaxis: Lovenox  Code Status: DO NOT RESUSCITATE Family Communication: None present  Disposition Plan: Anticipate IV fluids, empiric antibiotics, and trend troponin and monitor on telemetry and tamiflu and follow culture data. Consults called: None Admission status: INPATIENT       Medical decision making: Patient seen at 10:30 PM on 11/09/2016.  The patient was discussed with Dr. Cyndia Skeeters.  What exists of the patient's chart was reviewed in depth and summarized above.  Clinical condition: improved with fluids and spontaneous rhythm conversion, mentating well, stable from respiratory standpoint but will admit to stepdown given initial BP < 80 systolic, unstable cardiac rhythm, evidenc eof multiple organ injury.        Edwin Dada [facility] Pager [phone]      At the time of admission, it appears that the appropriate admission status for this patient is INPATIENT. This is judged to be reasonable and necessary in order to provide the required intensity of service to ensure the patient's safety given the presenting symptoms, physical exam findings, and initial radiographic and laboratory data in the context of their chronic comorbidities.  Together, these circumstances are felt to place her at high risk for further clinical  deterioration threatening life, limb, or organ.   Patient requires inpatient status due to high intensity of service, high risk for further deterioration and high frequency of surveillance required because of this acute illness that poses a threat to life, limb or bodily function.  Factors support inpatient status included: Suspected sepsis, unclear source at present, possibly just influenza but possibly bacterial, with evidence of multiple organ involvement (heart, renal, elevated lactic acid) Evidence of heart  injury, elevated troponin Initial heart rate 140, blood pressure less than 80 systolic, elevated creatinine  I certify that at the point of admission it is my clinical judgment that the patient will require inpatient hospital care spanning beyond 2 midnights from the point of admission and that early discharge would result in unnecessary risk of decompensation and readmission or threat to life, limb or bodily function.

## 2016-11-10 NOTE — Progress Notes (Signed)
[name]  Jeanette Mcintosh  [id number] DOB: 1941/07/15 DOA: 11/09/2016 PCP: Philis Fendt, MD    Brief Narrative:  76 y.o. female with a history of asthma, pAF not on anticoagulation, DM, HTN, and hypothyroidism who presented with fever, chills, and palpitations for 3 days.  She came to the emergency room when her sx persisted get better.  In the ED she was in Afib w/ a HR of 145, and a BP of 92/74.  She was influenza +.    Subjective: The patient reports that she is feeling better overall.  Since dosing with Zosyn however she states she has developed profuse watery diarrhea.  She denies chest pain nausea vomiting or abdominal pain.  She does admit that she felt she was developing symptoms suggestive of a UTI prior to her admission.  Assessment & Plan:  Sepsis due to Influenza A Continue Tamiflu - no indication for antibiotic therapy for this diagnosis  Mildly Elevated Troponin most consistent with stress-induced/demand ischemia - trend to assure normalizes but will not investigate further unless reaches a significantly higher peak  Parox Afib/flutter w/ acute RVR CHADS2-VASc 5 - patient herself refuses anticoagulation - rate now well controlled with volume resuscitation  Hyponatremia  Sodium 131 at presentation - normal status post volume expansion  Hypokalemia  Supplement and follow  Hypomagnesemia Supplement and follow  CKD v/s Acute kidney injury  Follow renal function with hydration - baseline unclear  UA c/w UTI F/u urine cx - patient reports this is a recurring problem and that the only antibiotic she tolerates well as Macrodantin - initiate Macrodantin and follow urine culture  HTN Blood pressure currently well-controlled  DM CBG currently well-controlled  Hypothyroidism TSH low at 0.189 possibly reflecting over-supplementation - target TSH should be 1.0 - hold synthroid dose for next 2 days - recheck TSH in 4-6 weeks post d/c   DVT  prophylaxis: lovenox  Code Status: DNR - NO CODE Family Communication: no family present at time of exam  Disposition Plan: transfer to tele bed - ambulate - f/u urine cx - correct electrolytes - possible d/c in 24-48hrs  Consultants:  none  Procedures: none  Antimicrobials:  Zosyn 2/27 Vancomycin 2/27   Objective: Blood pressure (!) 109/58, pulse 69, temperature 98.3 F (36.8 C), temperature source Oral, resp. rate 16, height 5\' 2"  (1.575 m), weight 61.8 kg (136 lb 3.9 oz), SpO2 97 %.  Intake/Output Summary (Last 24 hours) at 11/10/16 1440 Last data filed at 11/10/16 0750  Gross per 24 hour  Intake             3790 ml  Output              500 ml  Net             3290 ml   Filed Weights   11/09/16 1748 11/10/16 0225  Weight: 61.2 kg (135 lb) 61.8 kg (136 lb 3.9 oz)    Examination: General: No acute respiratory distress Lungs: Clear to auscultation bilaterally without wheezes or crackles Cardiovascular: Regular rate without murmur gallop or rub normal S1 and S2 Abdomen: Nontender, nondistended, soft, bowel sounds positive, no rebound, no ascites, no appreciable mass Extremities: No significant cyanosis, clubbing, or edema bilateral lower extremities  CBC:  Recent Labs Lab 11/09/16 1750 11/10/16 0300  WBC 6.6 3.5*  HGB 17.2* 13.4  HCT 47.4* 37.2  MCV 91.7 91.6  PLT 274 [id number]   Basic Metabolic Panel:  Recent  Labs Lab 11/09/16 1750 11/09/16 2300 11/10/16 0300  NA 131* 132* 136  K 3.7 3.1* 2.9*  CL 89* 103 102  CO2 23 23 24   GLUCOSE 214* 142* 171*  BUN 22* 22* 20  CREATININE 1.67* 1.33* 1.32*  CALCIUM 9.7 7.4* 8.2*  MG  --   --  1.6*   GFR: Estimated Creatinine Clearance: 31.9 mL/min (by C-G formula based on SCr of 1.32 mg/dL (H)).  Cardiac Enzymes:  Recent Labs Lab 11/09/16 2300  TROPONINI 0.40*    HbA1C: Hgb A1c MFr Bld  Date/Time Value Ref Range Status  07/29/2009 05:10 AM  4.6 - 6.1 % Final   5.9 (NOTE) The ADA recommends the following  therapeutic goal for glycemic control related to Hgb A1c measurement: Goal of therapy: <6.5 Hgb A1c  Reference: American Diabetes Association: Clinical Practice Recommendations 2010, Diabetes Care, 2010, 33: (Suppl  1).    CBG:  Recent Labs Lab 11/10/16 0231 11/10/16 0746 11/10/16 1143  GLUCAP 152* 114* 148*    Recent Results (from the past 240 hour(s))  MRSA PCR Screening     Status: None   Collection Time: 11/10/16  4:27 AM  Result Value Ref Range Status   MRSA by PCR NEGATIVE NEGATIVE Final    Comment:        The GeneXpert MRSA Assay (FDA approved for NASAL specimens only), is one component of a comprehensive MRSA colonization surveillance program. It is not intended to diagnose MRSA infection nor to guide or monitor treatment for MRSA infections.      Scheduled Meds: . buPROPion  150 mg Oral Daily  . enoxaparin (LOVENOX) injection  30 mg Subcutaneous Q24H  . insulin aspart  0-5 Units Subcutaneous QHS  . insulin aspart  0-9 Units Subcutaneous TID WC  . insulin glargine  7 Units Subcutaneous QHS  . latanoprost  1 drop Both Eyes QHS  . levothyroxine  137 mcg Oral QAC breakfast  . metoprolol tartrate  25 mg Oral BID  . oseltamivir  30 mg Oral BID  . piperacillin-tazobactam (ZOSYN)  IV  3.375 g Intravenous Q8H  . sodium chloride flush  3 mL Intravenous Q12H  . vancomycin  750 mg Intravenous Q24H   Continuous Infusions: . sodium chloride 100 mL/hr at 11/10/16 0230     LOS: 1 day   Cherene Altes, MD [facility] Office  [phone] Pager - Text Page per Shea Evans as per below:  On-Call/Text Page:      [email or web address]      password [email or web address]  If 7PM-7AM, please contact night-coverage [email or web address] Password [email or web address] 11/10/2016, 2:40 PM

## 2016-11-10 NOTE — Care Management Note (Signed)
Case Management Note  Patient Details  Name: Jeanette Mcintosh MRN: [id number] Date of Birth: [birth year]  Subjective/Objective:    Presents from home with afib, sepsis,  Elevated trops, hyponatremia, ckd, htn, has insulin dependent dm, hypothroidism, NCM will do benefit check on eliquis and xarelto.                 Action/Plan:   Expected Discharge Date:                  Expected Discharge Plan:  Home/Self Care  In-House Referral:     Discharge planning Services  CM Consult, Medication Assistance  Post Acute Care Choice:    Choice offered to:     DME Arranged:    DME Agency:     HH Arranged:    HH Agency:     Status of Service:  In process, will continue to follow  If discussed at Long Length of Stay Meetings, dates discussed:    Additional Comments:  Zenon Mayo, RN 11/10/2016, 7:40 AM

## 2016-11-10 NOTE — Progress Notes (Signed)
S/W [facility] @ [place] # [phone]   1. XARELTO   15 MG   COVER- YES  CO-PAY- $ 45.00  TIER- 3 DRUG  PRIOR APPROVAL- NO   2. XARELTO   20 MG  SAME AS ABOVE   3. ELIQUIS  2.5 MG  COVER- YES  CO-PAY- $ 45.00  TIER- 3 DRUG  PRIOR APPROVAL- NO   4. ELIQUIS  5 MG AND  10 MG  SAME AS ABOVE   PHARMACY : ANY RETAIL

## 2016-11-10 NOTE — Progress Notes (Signed)
  Echocardiogram 2D Echocardiogram has been performed.  Jeanette Mcintosh 11/10/2016, 10:17 AM

## 2016-11-10 NOTE — Progress Notes (Signed)
New Admission Note:   Arrival Method: Wheelchair Mental Orientation: A&O X4 Telemetry: Initiated Assessment: Completed Skin: Clean, Dry, Intact Iv: Infusing Pain: Denies Admission: Completed Unit Orientation: Patient has been orientated to the room, unit and staff.   Orders have been reviewed and implemented. Will continue to monitor the patient. Call light has been placed within reach and bed alarm has been activated.    Dixie Dials RN, BSN

## 2016-11-10 NOTE — Progress Notes (Signed)
Pt presently refusing IV Antibiotics, citing she'd like to wait to take them until after cultures indicate a true infection. Lamar Blinks of [place] notified. No orders received.

## 2016-11-10 NOTE — Progress Notes (Signed)
PHARMACY NOTE:  ANTIMICROBIAL RENAL DOSAGE ADJUSTMENT Current antimicrobial regimen includes a mismatch between antimicrobial dosage and estimated renal function.  As per policy approved by the Pharmacy & Therapeutics and Medical Executive Committees, the antimicrobial dosage will be adjusted accordingly.  Current antimicrobial dosage:  Tamiflu 30 mg daily  Indication: influenza A positive   Renal Function:  Estimated Creatinine Clearance: 31.9 mL/min (by C-G formula based on SCr of 1.32 mg/dL (H)). []      On intermittent HD, scheduled: []      On CRRT    Antimicrobial dosage has been changed to:  Tamiflu 30 mg BID  Additional comments:   Thank you for allowing pharmacy to be a part of this patient's care.  Vincenza Hews, PharmD, BCPS 11/10/2016, 9:10 AM

## 2016-11-11 DIAGNOSIS — J111 Influenza due to unidentified influenza virus with other respiratory manifestations: Secondary | ICD-10-CM

## 2016-11-11 DIAGNOSIS — I10 Essential (primary) hypertension: Secondary | ICD-10-CM

## 2016-11-11 DIAGNOSIS — R748 Abnormal levels of other serum enzymes: Secondary | ICD-10-CM

## 2016-11-11 DIAGNOSIS — E871 Hypo-osmolality and hyponatremia: Secondary | ICD-10-CM

## 2016-11-11 DIAGNOSIS — A419 Sepsis, unspecified organism: Principal | ICD-10-CM

## 2016-11-11 DIAGNOSIS — I480 Paroxysmal atrial fibrillation: Secondary | ICD-10-CM

## 2016-11-11 LAB — COMPREHENSIVE METABOLIC PANEL
ALT: 16 U/L (ref 14–54)
AST: 26 U/L (ref 15–41)
Albumin: 3.1 g/dL — ABNORMAL LOW (ref 3.5–5.0)
Alkaline Phosphatase: 58 U/L (ref 38–126)
Anion gap: 8 (ref 5–15)
BUN: 10 mg/dL (ref 6–20)
CO2: 23 mmol/L (ref 22–32)
Calcium: 8.1 mg/dL — ABNORMAL LOW (ref 8.9–10.3)
Chloride: 105 mmol/L (ref 101–111)
Creatinine, Ser: 1.03 mg/dL — ABNORMAL HIGH (ref 0.44–1.00)
GFR calc Af Amer: >60 mL/min (ref >60)
GFR calc non Af Amer: 52 mL/min — ABNORMAL LOW (ref >60)
Glucose, Bld: 85 mg/dL (ref 65–99)
Potassium: 3.4 mmol/L — ABNORMAL LOW (ref 3.5–5.1)
Sodium: 136 mmol/L (ref 135–145)
Total Bilirubin: 0.7 mg/dL (ref 0.3–1.2)
Total Protein: 5.2 g/dL — ABNORMAL LOW (ref 6.5–8.1)

## 2016-11-11 LAB — CBC
HCT: 35.2 % — ABNORMAL LOW (ref 36.0–46.0)
Hemoglobin: 12.1 g/dL (ref 12.0–15.0)
MCH: 31.3 pg (ref 26.0–34.0)
MCHC: 34.4 g/dL (ref 30.0–36.0)
MCV: 91.2 fL (ref 78.0–100.0)
Platelets: 179 10*3/uL (ref 150–400)
RBC: 3.86 MIL/uL — ABNORMAL LOW (ref 3.87–5.11)
RDW: 12.5 % (ref 11.5–15.5)
WBC: 3.9 10*3/uL — ABNORMAL LOW (ref 4.0–10.5)

## 2016-11-11 LAB — HEMOGLOBIN A1C
Hgb A1c MFr Bld: 6.3 % — ABNORMAL HIGH (ref 4.8–5.6)
Mean Plasma Glucose: 134 mg/dL

## 2016-11-11 LAB — GLUCOSE, CAPILLARY
Glucose-Capillary: 120 mg/dL — ABNORMAL HIGH (ref 65–99)
Glucose-Capillary: 113 mg/dL — ABNORMAL HIGH (ref 65–99)

## 2016-11-11 LAB — URINE CULTURE

## 2016-11-11 LAB — TROPONIN I: Troponin I: 0.22 ng/mL (ref <0.03)

## 2016-11-11 LAB — MAGNESIUM: Magnesium: 1.6 mg/dL — ABNORMAL LOW (ref 1.7–2.4)

## 2016-11-11 MED ORDER — MAGNESIUM SULFATE 2 GM/50ML IV SOLN
2.0000 g | Freq: Once | INTRAVENOUS | Status: DC
  Filled 2016-11-11: qty 50

## 2016-11-11 MED ORDER — POTASSIUM CHLORIDE 20 MEQ PO PACK
40.0000 meq | PACK | Freq: Once | ORAL | Status: DC
  Filled 2016-11-11: qty 2

## 2016-11-11 MED ORDER — OSELTAMIVIR PHOSPHATE 30 MG PO CAPS: 30.0000 mg | ORAL_CAPSULE | Freq: Two times a day (BID) | ORAL | 0 refills | Status: AC

## 2016-11-11 MED ORDER — PROMETHAZINE HCL 25 MG/ML IJ SOLN
12.5000 mg | Freq: Once | INTRAMUSCULAR | Status: DC
  Filled 2016-11-11: qty 1

## 2016-11-11 MED ORDER — LORAZEPAM 2 MG/ML IJ SOLN
0.5000 mg | Freq: Once | INTRAMUSCULAR | Status: AC
  Administered 2016-11-11: 0.5 mg via INTRAVENOUS
  Filled 2016-11-11: qty 1

## 2016-11-11 MED ORDER — LEVOTHYROXINE SODIUM 125 MCG PO TABS: 125.0000 ug | ORAL_TABLET | Freq: Every day | ORAL | 0 refills | Status: DC

## 2016-11-11 NOTE — Care Management Note (Signed)
Case Management Note  Patient Details  Name: BARBETTE MCGLAUN MRN: [id number] Date of Birth: June 16, 1941  Subjective/Objective:       CM following for progression and d/c planning.              Action/Plan: 11/11/2016 Met with pt re d/c needs. Pt states that she has a PCP and that she will make her own appointments. This CM informed by dept secretary Ambrose Finland of order for followup appointment, however pt PCP as listed is incorrect and this pt states that she has a PCP and that she prefers to make her own appointment. Also received consult re assistance with medications, however this pt has insurance with medication benefits therefore we are unable to assist with medication.   Expected Discharge Date:  11/11/16               Expected Discharge Plan:  Home/Self Care  In-House Referral:  NA  Discharge planning Services  CM Consult, Medication Assistance  Post Acute Care Choice:  NA Choice offered to:  NA  DME Arranged:  N/A DME Agency:  NA  HH Arranged:  NA HH Agency:  NA  Status of Service:  Completed, signed off  If discussed at [place] of Stay Meetings, dates discussed:    Additional Comments:  Adron Bene, RN 11/11/2016, 12:48 PM

## 2016-11-11 NOTE — Discharge Summary (Signed)
Physician Discharge Summary  Jeanette Mcintosh [id number] DOB: 17-Feb-1941 DOA: 11/09/2016  PCP: Philis Fendt, MD  Admit date: 11/09/2016 Discharge date: 11/11/2016  Admitted From:Home Disposition:Home  Recommendations for Outpatient Follow-up:  1. Follow up with PCP in 1-2 weeks 2. Please obtain BMP/CBC in one week  Home Health:No Equipment/Devices: No Discharge Condition: Stable CODE STATUS: Full code Diet recommendation: Heart healthy/diabetic  Brief/Interim Summary:75 y.o.femalewith a history of asthma, pAF not on anticoagulation, DM, HTN, and hypothyroidismwho presented with fever, chills, and palpitations for 3 days. In the ER patient was found to have atrial fibrillation with RVR and hypertension. She was found to have influenza positive. Discharge with oral Tamiflu.  Patient was treated for sepsis likely due to influenza A: Started on Tamiflu. The antibiotics discontinued. UA was positive however urine culture with multiple species. I discussed with the patient. She denied dysuria or urgency or any urinary symptoms. Patient reported that the urine sample was not clean catch because at that time she was having bowel movements as well. Patient is clinically improved. She denies fever, chills, headache, dizziness. She is in room air. She is very eager to go home today. I advised patient to follow-up with PCP.  Mild elevation in troponin likely in the setting of A. fib with RVR/demand ischemia. Echocardiogram unremarkable. Patient does not have chest pain.  Paroxysmal atrial flutter with RVR on admission: Patient declined to use any systemic anticoagulation. She has been diagnosed with atrial fibrillation for long time now. She is currently on aspirin.  Treating with oral potassium chloride for hypokalemia. Repleted magnesium sulfate for hypomagnesemia. Hyponatremia improved. Patient was encouraged to take oral and encouraged to check lab with PCP in a week.  Acute kidney injury  improved. UA showed UTI which is likely contaminant. Discontinued antibiotics  Patient was treated for hypertension and diabetes and hypothyroidism in the hospital. She was found to have low TSH level therefore the dose of Synthroid reduced. Recommended to monitor thyroid function test with PCP in 4-6 weeks.  Patient reported that she feels significantly better and able to ambulate without difficulties. She is in room air. She is a retired Civil engineer, contracting. She verbalized understanding of follow-up instruction. She is asking to go home today. Patient is medically stable to discharge home with outpatient follow-up.  Discharge Diagnoses:  Principal Problem:   Sepsis ([place]) Active Problems:   Type 2 diabetes mellitus with diabetic nephropathy, with long-term current use of insulin (HCC)   Essential hypertension   Paroxysmal atrial fibrillation (HCC)   Acute kidney injury    Hyponatremia   Elevated troponin   Influenza    Discharge Instructions  Discharge Instructions    Call MD for:  difficulty breathing, headache or visual disturbances    Complete by:  As directed    Call MD for:  extreme fatigue    Complete by:  As directed    Call MD for:  hives    Complete by:  As directed    Call MD for:  persistant dizziness or light-headedness    Complete by:  As directed    Call MD for:  persistant nausea and vomiting    Complete by:  As directed    Call MD for:  severe uncontrolled pain    Complete by:  As directed    Call MD for:  temperature >100.4    Complete by:  As directed    Diet - low sodium heart healthy    Complete by:  As directed    Diet Carb Modified    Complete by:  As directed    Discharge instructions    Complete by:  As directed    Please check TSH (thyroid function test in 4-6 weeks) with PCP, Please check lab (BMP, magnesium level) with PCP in one week.   Increase activity slowly    Complete by:  As directed      Allergies as of 11/11/2016       Reactions   Morphine And Related Nausea Only   Phenergan [promethazine Hcl] Other (See Comments)   Restless leg   Ambien [zolpidem] Other (See Comments)   Unknown   Benadryl [diphenhydramine] Other (See Comments)   Restless leg   Dicyclomine Other (See Comments)   constipation   Erythromycin Nausea And Vomiting   Ivp Dye [iodinated Diagnostic Agents] Nausea And Vomiting   Ondansetron Other (See Comments)   Unknown   Propoxyphene Other (See Comments)   Unknown   Statins Other (See Comments)   Unknown   Vitamin E Other (See Comments)   Hot flashes      Medication List    STOP taking these medications   Azelastine HCl 0.15 % Soln   chlorthalidone 25 MG tablet Commonly known as:  HYGROTON     TAKE these medications   albuterol 108 (90 Base) MCG/ACT inhaler Commonly known as:  PROAIR HFA Inhale 2 puffs into the lungs every 4 (four) hours as needed for wheezing or shortness of breath.   albuterol (2.5 MG/3ML) 0.083% nebulizer solution Commonly known as:  PROVENTIL Take 2.5 mg by nebulization every 6 (six) hours as needed for wheezing or shortness of breath.   b complex vitamins tablet Take 1 tablet by mouth daily.   benzonatate 100 MG capsule Commonly known as:  TESSALON TK ONE C PO  Q 8 H PRN   budesonide-formoterol 160-4.5 MCG/ACT inhaler Commonly known as:  SYMBICORT Inhale 2 puffs into the lungs 2 (two) times daily.   buPROPion 150 MG 12 hr tablet Commonly known as:  WELLBUTRIN SR Take 150 mg by mouth daily. Reported on 02/13/2016   cetirizine 10 MG tablet Commonly known as:  ZYRTEC Take 10 mg by mouth daily.   FOLIC ACID PO Take [id number] mcg by mouth daily.   LANTUS SOLOSTAR 100 UNIT/ML Solostar Pen Generic drug:  Insulin Glargine Inject 0-20 Units into the skin at bedtime. Per BS   latanoprost 0.005 % ophthalmic solution Commonly known as:  XALATAN Place 1 drop into both eyes at bedtime.   levothyroxine 125 MCG tablet Commonly known as:   SYNTHROID Take 1 tablet (125 mcg total) by mouth daily before breakfast. What changed:  medication strength  how much to take  when to take this   losartan 100 MG tablet Commonly known as:  COZAAR Take 100 mg by mouth daily.   meloxicam 7.5 MG tablet Commonly known as:  MOBIC Take 7.5 mg by mouth daily as needed for pain. Reported on 02/13/2016   metoprolol tartrate 25 MG tablet Commonly known as:  LOPRESSOR Take 25 mg by mouth 2 (two) times daily.   oseltamivir 30 MG capsule Commonly known as:  TAMIFLU Take 1 capsule (30 mg total) by mouth 2 (two) times daily.   PHENYLEPHRINE HCL PO Take 10 mg by mouth at bedtime.   RUTIN PO Take 500 mg by mouth at bedtime.   triamcinolone 55 MCG/ACT Aero nasal inhaler Commonly known as:  NASACORT AQ 1-2 sprays in each nostril daily  What changed:  how much to take  how to take this  when to take this  reasons to take this  additional instructions   VITAMIN A PO Take 1 capsule by mouth daily.   VITAMIN D PO Take 4,000 Units by mouth daily.      Follow-up Information    Philis Fendt, MD. Schedule an appointment as soon as possible for a visit in 1 week(s).   Specialty:  Internal Medicine Contact information: [place] [address] [phone]          Allergies  Allergen Reactions  . Morphine And Related Nausea Only  . Phenergan [Promethazine Hcl] Other (See Comments)    Restless leg  . Ambien [Zolpidem] Other (See Comments)    Unknown  . Benadryl [Diphenhydramine] Other (See Comments)    Restless leg  . Dicyclomine Other (See Comments)    constipation  . Erythromycin Nausea And Vomiting  . Ivp Dye [Iodinated Diagnostic Agents] Nausea And Vomiting  . Ondansetron Other (See Comments)    Unknown  . Propoxyphene Other (See Comments)    Unknown  . Statins Other (See Comments)    Unknown  . Vitamin E Other (See Comments)    Hot flashes     Consultations: None  Procedures/Studies: None  Subjective: Patient was seen and examined at bedside. She reported doing well. Denied fever, chills, headache, dizziness, nausea, vomiting, chest pain or shortness of breath. Is eager to go home today.  Discharge Exam: Vitals:   11/10/16 2048 11/11/16 0517  BP: (!) 144/67 128/66  Pulse: 80 77  Resp: 19 20  Temp: 98.7 F (37.1 C) 98.6 F (37 C)   Vitals:   11/10/16 1707 11/10/16 1751 11/10/16 2048 11/11/16 0517  BP:  (!) 146/68 (!) 144/67 128/66  Pulse:  79 80 77  Resp:  18 19 20   Temp: 99.8 F (37.7 C) 99.2 F (37.3 C) 98.7 F (37.1 C) 98.6 F (37 C)  TempSrc: Oral Oral Oral Oral  SpO2:  99% 99% 95%  Weight:   61.8 kg (136 lb 3.2 oz)   Height:        General: Pt is alert, awake, not in acute distress Cardiovascular: RRR, S1/S2 +, no rubs, no gallops Respiratory: CTA bilaterally, no wheezing, no rhonchi Abdominal: Soft, NT, ND, bowel sounds + Extremities: no edema, no cyanosis Neurology: Alert, awake, following commands, oriented   The results of significant diagnostics from this hospitalization (including imaging, microbiology, ancillary and laboratory) are listed below for reference.     Microbiology: Recent Results (from the past 240 hour(s))  Blood Culture (routine x 2)     Status: None (Preliminary result)   Collection Time: 11/09/16  7:05 PM  Result Value Ref Range Status   Specimen Description BLOOD LEFT ANTECUBITAL  Final   Special Requests IN PEDIATRIC BOTTLE 2CC  Final   Culture NO GROWTH < 24 HOURS  Final   Report Status PENDING  Incomplete  Blood Culture (routine x 2)     Status: None (Preliminary result)   Collection Time: 11/09/16  7:09 PM  Result Value Ref Range Status   Specimen Description BLOOD LEFT ANTECUBITAL  Final   Special Requests BOTTLES DRAWN AEROBIC ONLY 8CC  Final   Culture NO GROWTH < 24 HOURS  Final   Report Status PENDING  Incomplete  Urine culture     Status: Abnormal    Collection Time: 11/09/16 10:20 PM  Result Value Ref Range Status   Specimen  Description URINE, CLEAN CATCH  Final   Special Requests NONE  Final   Culture MULTIPLE SPECIES PRESENT, SUGGEST RECOLLECTION (A)  Final   Report Status 11/11/2016 FINAL  Final  MRSA PCR Screening     Status: None   Collection Time: 11/10/16  4:27 AM  Result Value Ref Range Status   MRSA by PCR NEGATIVE NEGATIVE Final    Comment:        The GeneXpert MRSA Assay (FDA approved for NASAL specimens only), is one component of a comprehensive MRSA colonization surveillance program. It is not intended to diagnose MRSA infection nor to guide or monitor treatment for MRSA infections.      Labs: BNP (last 3 results) No results for input(s): BNP in the last 8760 hours. Basic Metabolic Panel:  Recent Labs Lab 11/09/16 1750 11/09/16 2300 11/10/16 0300 11/11/16 0502  NA 131* 132* 136 136  K 3.7 3.1* 2.9* 3.4*  CL 89* 103 102 105  CO2 23 23 24 23   GLUCOSE 214* 142* 171* 85  BUN 22* 22* 20 10  CREATININE 1.67* 1.33* 1.32* 1.03*  CALCIUM 9.7 7.4* 8.2* 8.1*  MG  --   --  1.6* 1.6*   Liver Function Tests:  Recent Labs Lab 11/11/16 0502  AST 26  ALT 16  ALKPHOS 58  BILITOT 0.7  PROT 5.2*  ALBUMIN 3.1*   No results for input(s): LIPASE, AMYLASE in the last 168 hours. No results for input(s): AMMONIA in the last 168 hours. CBC:  Recent Labs Lab 11/09/16 1750 11/10/16 0300 11/11/16 0502  WBC 6.6 3.5* 3.9*  HGB 17.2* 13.4 12.1  HCT 47.4* 37.2 35.2*  MCV 91.7 91.6 91.2  PLT 274 206 179   Cardiac Enzymes:  Recent Labs Lab 11/09/16 2300 11/11/16 0502  TROPONINI 0.40* 0.22*   BNP: Invalid input(s): POCBNP CBG:  Recent Labs Lab 11/10/16 0746 11/10/16 1143 11/10/16 1706 11/10/16 2046 11/11/16 0854  GLUCAP 114* 148* 117* 121* 120*   D-Dimer  Recent Labs  11/09/16 1831  DDIMER 0.51*   Hgb A1c  Recent Labs  11/10/16 0249  HGBA1C 6.3*   Lipid Profile No results for  input(s): CHOL, HDL, LDLCALC, TRIG, CHOLHDL, LDLDIRECT in the last 72 hours. Thyroid function studies  Recent Labs  11/10/16 0249  TSH 0.189*   Anemia work up No results for input(s): VITAMINB12, FOLATE, FERRITIN, TIBC, IRON, RETICCTPCT in the last 72 hours. Urinalysis    Component Value Date/Time   COLORURINE YELLOW 11/09/2016 2220   APPEARANCEUR CLOUDY (A) 11/09/2016 2220   LABSPEC 1.025 11/09/2016 2220   PHURINE 6.0 11/09/2016 2220   GLUCOSEU NEGATIVE 11/09/2016 2220   HGBUR NEGATIVE 11/09/2016 2220   BILIRUBINUR NEGATIVE 11/09/2016 2220   KETONESUR NEGATIVE 11/09/2016 2220   PROTEINUR NEGATIVE 11/09/2016 2220   NITRITE NEGATIVE 11/09/2016 2220   LEUKOCYTESUR LARGE (A) 11/09/2016 2220   Sepsis Labs Invalid input(s): PROCALCITONIN,  WBC,  LACTICIDVEN Microbiology Recent Results (from the past 240 hour(s))  Blood Culture (routine x 2)     Status: None (Preliminary result)   Collection Time: 11/09/16  7:05 PM  Result Value Ref Range Status   Specimen Description BLOOD LEFT ANTECUBITAL  Final   Special Requests IN PEDIATRIC BOTTLE 2CC  Final   Culture NO GROWTH < 24 HOURS  Final   Report Status PENDING  Incomplete  Blood Culture (routine x 2)     Status: None (Preliminary result)   Collection Time: 11/09/16  7:09 PM  Result Value Ref Range Status  Specimen Description BLOOD LEFT ANTECUBITAL  Final   Special Requests BOTTLES DRAWN AEROBIC ONLY 8CC  Final   Culture NO GROWTH < 24 HOURS  Final   Report Status PENDING  Incomplete  Urine culture     Status: Abnormal   Collection Time: 11/09/16 10:20 PM  Result Value Ref Range Status   Specimen Description URINE, CLEAN CATCH  Final   Special Requests NONE  Final   Culture MULTIPLE SPECIES PRESENT, SUGGEST RECOLLECTION (A)  Final   Report Status 11/11/2016 FINAL  Final  MRSA PCR Screening     Status: None   Collection Time: 11/10/16  4:27 AM  Result Value Ref Range Status   MRSA by PCR NEGATIVE NEGATIVE Final     Comment:        The GeneXpert MRSA Assay (FDA approved for NASAL specimens only), is one component of a comprehensive MRSA colonization surveillance program. It is not intended to diagnose MRSA infection nor to guide or monitor treatment for MRSA infections.      Time coordinating discharge: 32 minutes  SIGNED:   Rosita Fire, MD  [facility] 11/11/2016, 10:36 AM  If 7PM-7AM, please contact night-coverage [email or web address] Password [email or web address]

## 2016-11-14 LAB — CULTURE, BLOOD (ROUTINE X 2)
Culture: NO GROWTH
Culture: NO GROWTH

## 2016-11-16 DIAGNOSIS — H401131 Primary open-angle glaucoma, bilateral, mild stage: Secondary | ICD-10-CM | POA: Diagnosis not present

## 2016-11-18 DIAGNOSIS — R197 Diarrhea, unspecified: Secondary | ICD-10-CM | POA: Diagnosis not present

## 2016-11-23 DIAGNOSIS — R51 Headache: Secondary | ICD-10-CM | POA: Diagnosis not present

## 2016-12-10 DIAGNOSIS — R7989 Other specified abnormal findings of blood chemistry: Secondary | ICD-10-CM | POA: Diagnosis not present

## 2016-12-10 DIAGNOSIS — R197 Diarrhea, unspecified: Secondary | ICD-10-CM | POA: Diagnosis not present

## 2016-12-10 DIAGNOSIS — Z6823 Body mass index (BMI) 23.0-23.9, adult: Secondary | ICD-10-CM | POA: Diagnosis not present

## 2016-12-10 DIAGNOSIS — J09X3 Influenza due to identified novel influenza A virus with gastrointestinal manifestations: Secondary | ICD-10-CM | POA: Diagnosis not present

## 2016-12-10 DIAGNOSIS — E119 Type 2 diabetes mellitus without complications: Secondary | ICD-10-CM | POA: Diagnosis not present

## 2016-12-10 DIAGNOSIS — E871 Hypo-osmolality and hyponatremia: Secondary | ICD-10-CM | POA: Diagnosis not present

## 2016-12-10 DIAGNOSIS — N179 Acute kidney failure, unspecified: Secondary | ICD-10-CM | POA: Diagnosis not present

## 2016-12-10 DIAGNOSIS — I10 Essential (primary) hypertension: Secondary | ICD-10-CM | POA: Diagnosis not present

## 2016-12-10 DIAGNOSIS — A419 Sepsis, unspecified organism: Secondary | ICD-10-CM | POA: Diagnosis not present

## 2016-12-10 DIAGNOSIS — R05 Cough: Secondary | ICD-10-CM | POA: Diagnosis not present

## 2016-12-10 DIAGNOSIS — I480 Paroxysmal atrial fibrillation: Secondary | ICD-10-CM | POA: Diagnosis not present

## 2017-01-06 DIAGNOSIS — M25532 Pain in left wrist: Secondary | ICD-10-CM | POA: Diagnosis not present

## 2017-01-06 DIAGNOSIS — W19XXXA Unspecified fall, initial encounter: Secondary | ICD-10-CM | POA: Diagnosis not present

## 2017-01-07 DIAGNOSIS — N390 Urinary tract infection, site not specified: Secondary | ICD-10-CM | POA: Diagnosis not present

## 2017-01-07 DIAGNOSIS — R8299 Other abnormal findings in urine: Secondary | ICD-10-CM | POA: Diagnosis not present

## 2017-01-10 DIAGNOSIS — I10 Essential (primary) hypertension: Secondary | ICD-10-CM | POA: Diagnosis not present

## 2017-01-10 DIAGNOSIS — M25532 Pain in left wrist: Secondary | ICD-10-CM | POA: Diagnosis not present

## 2017-01-10 DIAGNOSIS — Z6824 Body mass index (BMI) 24.0-24.9, adult: Secondary | ICD-10-CM | POA: Diagnosis not present

## 2017-01-10 DIAGNOSIS — E784 Other hyperlipidemia: Secondary | ICD-10-CM | POA: Diagnosis not present

## 2017-01-10 DIAGNOSIS — E119 Type 2 diabetes mellitus without complications: Secondary | ICD-10-CM | POA: Diagnosis not present

## 2017-01-10 DIAGNOSIS — E038 Other specified hypothyroidism: Secondary | ICD-10-CM | POA: Diagnosis not present

## 2017-01-13 DIAGNOSIS — I10 Essential (primary) hypertension: Secondary | ICD-10-CM | POA: Diagnosis not present

## 2017-02-09 DIAGNOSIS — D1801 Hemangioma of skin and subcutaneous tissue: Secondary | ICD-10-CM | POA: Diagnosis not present

## 2017-02-09 DIAGNOSIS — L821 Other seborrheic keratosis: Secondary | ICD-10-CM | POA: Diagnosis not present

## 2017-02-09 DIAGNOSIS — Z85820 Personal history of malignant melanoma of skin: Secondary | ICD-10-CM | POA: Diagnosis not present

## 2017-02-25 DIAGNOSIS — R8299 Other abnormal findings in urine: Secondary | ICD-10-CM | POA: Diagnosis not present

## 2017-02-25 DIAGNOSIS — N390 Urinary tract infection, site not specified: Secondary | ICD-10-CM | POA: Diagnosis not present

## 2017-03-14 DIAGNOSIS — H35362 Drusen (degenerative) of macula, left eye: Secondary | ICD-10-CM | POA: Diagnosis not present

## 2017-03-14 DIAGNOSIS — H353131 Nonexudative age-related macular degeneration, bilateral, early dry stage: Secondary | ICD-10-CM | POA: Diagnosis not present

## 2017-03-14 DIAGNOSIS — E505 Vitamin A deficiency with night blindness: Secondary | ICD-10-CM | POA: Diagnosis not present

## 2017-03-14 DIAGNOSIS — H35361 Drusen (degenerative) of macula, right eye: Secondary | ICD-10-CM | POA: Diagnosis not present

## 2017-03-24 DIAGNOSIS — H401131 Primary open-angle glaucoma, bilateral, mild stage: Secondary | ICD-10-CM | POA: Diagnosis not present

## 2017-04-01 DIAGNOSIS — I10 Essential (primary) hypertension: Secondary | ICD-10-CM | POA: Diagnosis not present

## 2017-04-08 DIAGNOSIS — I10 Essential (primary) hypertension: Secondary | ICD-10-CM | POA: Diagnosis not present

## 2017-04-08 DIAGNOSIS — Z0000 Encounter for general adult medical examination without abnormal findings: Secondary | ICD-10-CM | POA: Diagnosis not present

## 2017-04-08 DIAGNOSIS — I6529 Occlusion and stenosis of unspecified carotid artery: Secondary | ICD-10-CM | POA: Diagnosis not present

## 2017-04-08 DIAGNOSIS — Z6825 Body mass index (BMI) 25.0-25.9, adult: Secondary | ICD-10-CM | POA: Diagnosis not present

## 2017-04-08 DIAGNOSIS — I480 Paroxysmal atrial fibrillation: Secondary | ICD-10-CM | POA: Diagnosis not present

## 2017-04-08 DIAGNOSIS — H3530 Unspecified macular degeneration: Secondary | ICD-10-CM | POA: Diagnosis not present

## 2017-04-08 DIAGNOSIS — M810 Age-related osteoporosis without current pathological fracture: Secondary | ICD-10-CM | POA: Diagnosis not present

## 2017-04-08 DIAGNOSIS — E784 Other hyperlipidemia: Secondary | ICD-10-CM | POA: Diagnosis not present

## 2017-04-08 DIAGNOSIS — E038 Other specified hypothyroidism: Secondary | ICD-10-CM | POA: Diagnosis not present

## 2017-04-08 DIAGNOSIS — Z1389 Encounter for screening for other disorder: Secondary | ICD-10-CM | POA: Diagnosis not present

## 2017-04-08 DIAGNOSIS — N390 Urinary tract infection, site not specified: Secondary | ICD-10-CM | POA: Diagnosis not present

## 2017-04-08 DIAGNOSIS — Z794 Long term (current) use of insulin: Secondary | ICD-10-CM | POA: Diagnosis not present

## 2017-04-13 DIAGNOSIS — R05 Cough: Secondary | ICD-10-CM | POA: Diagnosis not present

## 2017-04-13 DIAGNOSIS — R5381 Other malaise: Secondary | ICD-10-CM | POA: Diagnosis not present

## 2017-04-15 ENCOUNTER — Other Ambulatory Visit (HOSPITAL_COMMUNITY): Payer: Self-pay | Admitting: Internal Medicine

## 2017-04-15 DIAGNOSIS — Z1212 Encounter for screening for malignant neoplasm of rectum: Secondary | ICD-10-CM | POA: Diagnosis not present

## 2017-04-15 DIAGNOSIS — I6529 Occlusion and stenosis of unspecified carotid artery: Secondary | ICD-10-CM

## 2017-04-18 ENCOUNTER — Ambulatory Visit (HOSPITAL_COMMUNITY)
Admission: RE | Admit: 2017-04-18 | Discharge: 2017-04-18 | Disposition: A | Discharge status: Home / Self Care | Payer: PPO | Source: Ambulatory Visit | Attending: Surgery | Admitting: Surgery

## 2017-04-18 DIAGNOSIS — I6529 Occlusion and stenosis of unspecified carotid artery: Secondary | ICD-10-CM | POA: Diagnosis not present

## 2017-04-18 DIAGNOSIS — M810 Age-related osteoporosis without current pathological fracture: Secondary | ICD-10-CM | POA: Diagnosis not present

## 2017-04-18 DIAGNOSIS — Z1231 Encounter for screening mammogram for malignant neoplasm of breast: Secondary | ICD-10-CM | POA: Diagnosis not present

## 2017-04-18 LAB — VAS US CAROTID
LEFT ECA DIAS: -2 cm/s
LEFT VERTEBRAL DIAS: 15 cm/s
Left CCA dist dias: -14 cm/s
Left CCA dist sys: -66 cm/s
Left CCA prox dias: 15 cm/s
Left CCA prox sys: 93 cm/s
Left ICA dist dias: -16 cm/s
Left ICA dist sys: -55 cm/s
Left ICA prox dias: -14 cm/s
Left ICA prox sys: -56 cm/s
RIGHT CCA MID DIAS: -15 cm/s
RIGHT ECA DIAS: -3 cm/s
RIGHT VERTEBRAL DIAS: 9 cm/s
Right CCA prox dias: 14 cm/s
Right CCA prox sys: 91 cm/s
Right cca dist sys: -58 cm/s

## 2017-07-06 DIAGNOSIS — E038 Other specified hypothyroidism: Secondary | ICD-10-CM | POA: Diagnosis not present

## 2017-07-06 DIAGNOSIS — N390 Urinary tract infection, site not specified: Secondary | ICD-10-CM | POA: Diagnosis not present

## 2017-07-06 DIAGNOSIS — D692 Other nonthrombocytopenic purpura: Secondary | ICD-10-CM | POA: Diagnosis not present

## 2017-07-06 DIAGNOSIS — Z794 Long term (current) use of insulin: Secondary | ICD-10-CM | POA: Diagnosis not present

## 2017-07-06 DIAGNOSIS — I480 Paroxysmal atrial fibrillation: Secondary | ICD-10-CM | POA: Diagnosis not present

## 2017-07-06 DIAGNOSIS — J328 Other chronic sinusitis: Secondary | ICD-10-CM | POA: Diagnosis not present

## 2017-07-06 DIAGNOSIS — G4709 Other insomnia: Secondary | ICD-10-CM | POA: Diagnosis not present

## 2017-07-06 DIAGNOSIS — Z6824 Body mass index (BMI) 24.0-24.9, adult: Secondary | ICD-10-CM | POA: Diagnosis not present

## 2017-07-06 DIAGNOSIS — E1129 Type 2 diabetes mellitus with other diabetic kidney complication: Secondary | ICD-10-CM | POA: Diagnosis not present

## 2017-07-06 DIAGNOSIS — R05 Cough: Secondary | ICD-10-CM | POA: Diagnosis not present

## 2017-07-06 DIAGNOSIS — M25512 Pain in left shoulder: Secondary | ICD-10-CM | POA: Diagnosis not present

## 2017-07-06 DIAGNOSIS — F418 Other specified anxiety disorders: Secondary | ICD-10-CM | POA: Diagnosis not present

## 2017-07-13 DIAGNOSIS — M67912 Unspecified disorder of synovium and tendon, left shoulder: Secondary | ICD-10-CM | POA: Diagnosis not present

## 2017-07-18 DIAGNOSIS — H401131 Primary open-angle glaucoma, bilateral, mild stage: Secondary | ICD-10-CM | POA: Diagnosis not present

## 2017-08-08 DIAGNOSIS — M67912 Unspecified disorder of synovium and tendon, left shoulder: Secondary | ICD-10-CM | POA: Diagnosis not present

## 2017-08-08 DIAGNOSIS — M25512 Pain in left shoulder: Secondary | ICD-10-CM | POA: Diagnosis not present

## 2017-08-11 ENCOUNTER — Other Ambulatory Visit: Payer: Self-pay | Admitting: *Deleted

## 2017-08-11 NOTE — Patient Outreach (Signed)
[facility]) Care Management  08/11/2017  Jeanette Mcintosh [birth year] [id number]  Referral via Health [employer]; Reason-High risk for falls d/t ataxia & gait disturbance: hx DM 2 with insulin use, nephropathy, HTN, PAF:  Telephone to patient who was advised of reason for call & [facility] care management services. HIPPA verification received. Patient voices that she remembers insurance medical person coming to her home but does not understand why she was referred to case management.   States she does have a gait disturbance because she has had both hips replaced & her legs are currently not the same length. States she walks with a limp because of that. States she does not use anything to help her walk. States she has had physical therapy sessions & currently is receiving physical therapy for shoulder pain. States she sees orthopedist as needed. Patient was advised of falls prevention strategies and voices understanding.   Voices that she manages her medications & takes medications as prescribed. States she manages her diabetes & does not need any further information or teaching at this time.  Voices she has no case management needs at this time.   Plan: Send to care management assistant for case closure.    Sherrin Daisy, RN BSN [place] Management Coordinator [facility] Care Management  [phone]

## 2017-08-15 DIAGNOSIS — M67912 Unspecified disorder of synovium and tendon, left shoulder: Secondary | ICD-10-CM | POA: Diagnosis not present

## 2017-08-15 DIAGNOSIS — M25512 Pain in left shoulder: Secondary | ICD-10-CM | POA: Diagnosis not present

## 2017-08-18 DIAGNOSIS — M25512 Pain in left shoulder: Secondary | ICD-10-CM | POA: Diagnosis not present

## 2017-08-18 DIAGNOSIS — M67912 Unspecified disorder of synovium and tendon, left shoulder: Secondary | ICD-10-CM | POA: Diagnosis not present

## 2017-08-29 DIAGNOSIS — M25512 Pain in left shoulder: Secondary | ICD-10-CM | POA: Diagnosis not present

## 2017-08-29 DIAGNOSIS — M67912 Unspecified disorder of synovium and tendon, left shoulder: Secondary | ICD-10-CM | POA: Diagnosis not present

## 2017-09-01 DIAGNOSIS — M67912 Unspecified disorder of synovium and tendon, left shoulder: Secondary | ICD-10-CM | POA: Diagnosis not present

## 2017-09-01 DIAGNOSIS — M25512 Pain in left shoulder: Secondary | ICD-10-CM | POA: Diagnosis not present

## 2017-09-12 DIAGNOSIS — M25512 Pain in left shoulder: Secondary | ICD-10-CM | POA: Diagnosis not present

## 2017-09-12 DIAGNOSIS — M67912 Unspecified disorder of synovium and tendon, left shoulder: Secondary | ICD-10-CM | POA: Diagnosis not present

## 2017-09-14 DIAGNOSIS — M25512 Pain in left shoulder: Secondary | ICD-10-CM | POA: Diagnosis not present

## 2017-09-14 DIAGNOSIS — M67912 Unspecified disorder of synovium and tendon, left shoulder: Secondary | ICD-10-CM | POA: Diagnosis not present

## 2017-09-19 DIAGNOSIS — M25512 Pain in left shoulder: Secondary | ICD-10-CM | POA: Diagnosis not present

## 2017-09-19 DIAGNOSIS — M67912 Unspecified disorder of synovium and tendon, left shoulder: Secondary | ICD-10-CM | POA: Diagnosis not present

## 2017-09-27 DIAGNOSIS — N390 Urinary tract infection, site not specified: Secondary | ICD-10-CM | POA: Diagnosis not present

## 2017-09-27 DIAGNOSIS — Z87440 Personal history of urinary (tract) infections: Secondary | ICD-10-CM | POA: Diagnosis not present

## 2017-10-18 DIAGNOSIS — N3941 Urge incontinence: Secondary | ICD-10-CM | POA: Diagnosis not present

## 2017-10-18 DIAGNOSIS — R351 Nocturia: Secondary | ICD-10-CM | POA: Diagnosis not present

## 2017-10-18 DIAGNOSIS — N390 Urinary tract infection, site not specified: Secondary | ICD-10-CM | POA: Diagnosis not present

## 2017-10-18 DIAGNOSIS — R350 Frequency of micturition: Secondary | ICD-10-CM | POA: Diagnosis not present

## 2017-11-04 DIAGNOSIS — E119 Type 2 diabetes mellitus without complications: Secondary | ICD-10-CM | POA: Diagnosis not present

## 2017-11-04 DIAGNOSIS — Z794 Long term (current) use of insulin: Secondary | ICD-10-CM | POA: Diagnosis not present

## 2017-11-04 DIAGNOSIS — D692 Other nonthrombocytopenic purpura: Secondary | ICD-10-CM | POA: Diagnosis not present

## 2017-11-04 DIAGNOSIS — N390 Urinary tract infection, site not specified: Secondary | ICD-10-CM | POA: Diagnosis not present

## 2017-11-04 DIAGNOSIS — M545 Low back pain: Secondary | ICD-10-CM | POA: Diagnosis not present

## 2017-11-04 DIAGNOSIS — Z6825 Body mass index (BMI) 25.0-25.9, adult: Secondary | ICD-10-CM | POA: Diagnosis not present

## 2017-11-10 DIAGNOSIS — N390 Urinary tract infection, site not specified: Secondary | ICD-10-CM | POA: Diagnosis not present

## 2017-11-15 DIAGNOSIS — H524 Presbyopia: Secondary | ICD-10-CM | POA: Diagnosis not present

## 2017-11-15 DIAGNOSIS — H401131 Primary open-angle glaucoma, bilateral, mild stage: Secondary | ICD-10-CM | POA: Diagnosis not present

## 2017-11-16 DIAGNOSIS — R351 Nocturia: Secondary | ICD-10-CM | POA: Diagnosis not present

## 2017-11-16 DIAGNOSIS — N390 Urinary tract infection, site not specified: Secondary | ICD-10-CM | POA: Diagnosis not present

## 2017-11-16 DIAGNOSIS — N3941 Urge incontinence: Secondary | ICD-10-CM | POA: Diagnosis not present

## 2017-12-07 DIAGNOSIS — L089 Local infection of the skin and subcutaneous tissue, unspecified: Secondary | ICD-10-CM | POA: Diagnosis not present

## 2017-12-07 DIAGNOSIS — Z6825 Body mass index (BMI) 25.0-25.9, adult: Secondary | ICD-10-CM | POA: Diagnosis not present

## 2017-12-12 DIAGNOSIS — H35423 Microcystoid degeneration of retina, bilateral: Secondary | ICD-10-CM | POA: Diagnosis not present

## 2017-12-12 DIAGNOSIS — H353131 Nonexudative age-related macular degeneration, bilateral, early dry stage: Secondary | ICD-10-CM | POA: Diagnosis not present

## 2017-12-12 DIAGNOSIS — H43813 Vitreous degeneration, bilateral: Secondary | ICD-10-CM | POA: Diagnosis not present

## 2017-12-12 DIAGNOSIS — H35033 Hypertensive retinopathy, bilateral: Secondary | ICD-10-CM | POA: Diagnosis not present

## 2018-01-10 DIAGNOSIS — R351 Nocturia: Secondary | ICD-10-CM | POA: Diagnosis not present

## 2018-02-15 DIAGNOSIS — Z85820 Personal history of malignant melanoma of skin: Secondary | ICD-10-CM | POA: Diagnosis not present

## 2018-02-15 DIAGNOSIS — L821 Other seborrheic keratosis: Secondary | ICD-10-CM | POA: Diagnosis not present

## 2018-02-15 DIAGNOSIS — L820 Inflamed seborrheic keratosis: Secondary | ICD-10-CM | POA: Diagnosis not present

## 2018-02-15 DIAGNOSIS — D1801 Hemangioma of skin and subcutaneous tissue: Secondary | ICD-10-CM | POA: Diagnosis not present

## 2018-03-20 DIAGNOSIS — H401131 Primary open-angle glaucoma, bilateral, mild stage: Secondary | ICD-10-CM | POA: Diagnosis not present

## 2018-04-05 DIAGNOSIS — R82998 Other abnormal findings in urine: Secondary | ICD-10-CM | POA: Diagnosis not present

## 2018-04-05 DIAGNOSIS — I10 Essential (primary) hypertension: Secondary | ICD-10-CM | POA: Diagnosis not present

## 2018-04-06 DIAGNOSIS — E7849 Other hyperlipidemia: Secondary | ICD-10-CM | POA: Diagnosis not present

## 2018-04-10 DIAGNOSIS — R05 Cough: Secondary | ICD-10-CM | POA: Diagnosis not present

## 2018-04-10 DIAGNOSIS — R0781 Pleurodynia: Secondary | ICD-10-CM | POA: Diagnosis not present

## 2018-04-12 DIAGNOSIS — M791 Myalgia, unspecified site: Secondary | ICD-10-CM | POA: Diagnosis not present

## 2018-04-12 DIAGNOSIS — R1011 Right upper quadrant pain: Secondary | ICD-10-CM | POA: Diagnosis not present

## 2018-04-12 DIAGNOSIS — Z6825 Body mass index (BMI) 25.0-25.9, adult: Secondary | ICD-10-CM | POA: Diagnosis not present

## 2018-04-12 DIAGNOSIS — N183 Chronic kidney disease, stage 3 (moderate): Secondary | ICD-10-CM | POA: Diagnosis not present

## 2018-04-12 DIAGNOSIS — I129 Hypertensive chronic kidney disease with stage 1 through stage 4 chronic kidney disease, or unspecified chronic kidney disease: Secondary | ICD-10-CM | POA: Diagnosis not present

## 2018-04-12 DIAGNOSIS — I6529 Occlusion and stenosis of unspecified carotid artery: Secondary | ICD-10-CM | POA: Diagnosis not present

## 2018-04-12 DIAGNOSIS — J441 Chronic obstructive pulmonary disease with (acute) exacerbation: Secondary | ICD-10-CM | POA: Diagnosis not present

## 2018-04-12 DIAGNOSIS — F1320 Sedative, hypnotic or anxiolytic dependence, uncomplicated: Secondary | ICD-10-CM | POA: Diagnosis not present

## 2018-04-12 DIAGNOSIS — G4709 Other insomnia: Secondary | ICD-10-CM | POA: Diagnosis not present

## 2018-04-12 DIAGNOSIS — R0781 Pleurodynia: Secondary | ICD-10-CM | POA: Diagnosis not present

## 2018-04-12 DIAGNOSIS — Z1389 Encounter for screening for other disorder: Secondary | ICD-10-CM | POA: Diagnosis not present

## 2018-04-12 DIAGNOSIS — Z0000 Encounter for general adult medical examination without abnormal findings: Secondary | ICD-10-CM | POA: Diagnosis not present

## 2018-04-13 ENCOUNTER — Other Ambulatory Visit: Payer: Self-pay | Admitting: Internal Medicine

## 2018-04-13 DIAGNOSIS — R1011 Right upper quadrant pain: Secondary | ICD-10-CM

## 2018-04-18 DIAGNOSIS — M65351 Trigger finger, right little finger: Secondary | ICD-10-CM | POA: Diagnosis not present

## 2018-04-18 DIAGNOSIS — M79641 Pain in right hand: Secondary | ICD-10-CM | POA: Diagnosis not present

## 2018-04-18 DIAGNOSIS — M65341 Trigger finger, right ring finger: Secondary | ICD-10-CM | POA: Diagnosis not present

## 2018-04-18 DIAGNOSIS — M66241 Spontaneous rupture of extensor tendons, right hand: Secondary | ICD-10-CM | POA: Insufficient documentation

## 2018-04-19 DIAGNOSIS — Z1231 Encounter for screening mammogram for malignant neoplasm of breast: Secondary | ICD-10-CM | POA: Diagnosis not present

## 2018-04-24 ENCOUNTER — Ambulatory Visit
Admission: RE | Admit: 2018-04-24 | Discharge: 2018-04-24 | Disposition: A | Discharge status: Home / Self Care | Payer: PPO | Source: Ambulatory Visit | Attending: Internal Medicine | Admitting: Internal Medicine

## 2018-04-24 DIAGNOSIS — R1011 Right upper quadrant pain: Secondary | ICD-10-CM

## 2018-05-17 DIAGNOSIS — M66241 Spontaneous rupture of extensor tendons, right hand: Secondary | ICD-10-CM | POA: Diagnosis not present

## 2018-06-26 DIAGNOSIS — M25552 Pain in left hip: Secondary | ICD-10-CM | POA: Diagnosis not present

## 2018-07-12 DIAGNOSIS — E1169 Type 2 diabetes mellitus with other specified complication: Secondary | ICD-10-CM | POA: Diagnosis not present

## 2018-07-12 DIAGNOSIS — Z6826 Body mass index (BMI) 26.0-26.9, adult: Secondary | ICD-10-CM | POA: Diagnosis not present

## 2018-07-12 DIAGNOSIS — J439 Emphysema, unspecified: Secondary | ICD-10-CM | POA: Diagnosis not present

## 2018-07-12 DIAGNOSIS — I10 Essential (primary) hypertension: Secondary | ICD-10-CM | POA: Diagnosis not present

## 2018-07-12 DIAGNOSIS — Z794 Long term (current) use of insulin: Secondary | ICD-10-CM | POA: Diagnosis not present

## 2018-07-18 DIAGNOSIS — M25552 Pain in left hip: Secondary | ICD-10-CM | POA: Diagnosis not present

## 2018-07-19 ENCOUNTER — Other Ambulatory Visit (HOSPITAL_COMMUNITY): Payer: Self-pay | Admitting: Orthopedic Surgery

## 2018-07-19 DIAGNOSIS — M25552 Pain in left hip: Secondary | ICD-10-CM

## 2018-07-28 ENCOUNTER — Encounter (HOSPITAL_COMMUNITY)
Admission: RE | Admit: 2018-07-28 | Discharge: 2018-07-28 | Disposition: A | Discharge status: Home / Self Care | Payer: PPO | Source: Ambulatory Visit | Attending: Orthopedic Surgery | Admitting: Orthopedic Surgery

## 2018-07-28 DIAGNOSIS — M25552 Pain in left hip: Secondary | ICD-10-CM | POA: Diagnosis not present

## 2018-07-28 MED ORDER — TECHNETIUM TC 99M MEDRONATE IV KIT
20.0000 | PACK | Freq: Once | INTRAVENOUS | Status: AC | PRN
  Administered 2018-07-28: 19.6 via INTRAVENOUS

## 2018-07-31 DIAGNOSIS — H401131 Primary open-angle glaucoma, bilateral, mild stage: Secondary | ICD-10-CM | POA: Diagnosis not present

## 2018-08-23 DIAGNOSIS — M25552 Pain in left hip: Secondary | ICD-10-CM | POA: Diagnosis not present

## 2018-08-31 DIAGNOSIS — M25552 Pain in left hip: Secondary | ICD-10-CM | POA: Diagnosis not present

## 2018-09-12 DIAGNOSIS — M25552 Pain in left hip: Secondary | ICD-10-CM | POA: Diagnosis not present

## 2018-10-20 DIAGNOSIS — M5416 Radiculopathy, lumbar region: Secondary | ICD-10-CM | POA: Diagnosis not present

## 2018-10-20 DIAGNOSIS — R252 Cramp and spasm: Secondary | ICD-10-CM | POA: Diagnosis not present

## 2018-10-20 DIAGNOSIS — I10 Essential (primary) hypertension: Secondary | ICD-10-CM | POA: Diagnosis not present

## 2018-10-20 DIAGNOSIS — E1169 Type 2 diabetes mellitus with other specified complication: Secondary | ICD-10-CM | POA: Diagnosis not present

## 2018-10-20 DIAGNOSIS — Z6825 Body mass index (BMI) 25.0-25.9, adult: Secondary | ICD-10-CM | POA: Diagnosis not present

## 2018-10-20 DIAGNOSIS — I480 Paroxysmal atrial fibrillation: Secondary | ICD-10-CM | POA: Diagnosis not present

## 2018-11-06 DIAGNOSIS — M5416 Radiculopathy, lumbar region: Secondary | ICD-10-CM | POA: Diagnosis not present

## 2018-11-06 DIAGNOSIS — M545 Low back pain: Secondary | ICD-10-CM | POA: Diagnosis not present

## 2018-11-13 ENCOUNTER — Ambulatory Visit: Payer: PPO | Admitting: Cardiology

## 2018-11-13 ENCOUNTER — Other Ambulatory Visit: Payer: Self-pay | Admitting: Cardiology

## 2018-11-13 ENCOUNTER — Encounter: Payer: Self-pay | Admitting: Cardiology

## 2018-11-13 VITALS — BP 96/63 | HR 66 | Ht 62.0 in | Wt 149.0 lb

## 2018-11-13 DIAGNOSIS — Z794 Long term (current) use of insulin: Secondary | ICD-10-CM | POA: Diagnosis not present

## 2018-11-13 DIAGNOSIS — I480 Paroxysmal atrial fibrillation: Secondary | ICD-10-CM

## 2018-11-13 DIAGNOSIS — E1121 Type 2 diabetes mellitus with diabetic nephropathy: Secondary | ICD-10-CM | POA: Diagnosis not present

## 2018-11-13 DIAGNOSIS — E7800 Pure hypercholesterolemia, unspecified: Secondary | ICD-10-CM

## 2018-11-13 DIAGNOSIS — I10 Essential (primary) hypertension: Secondary | ICD-10-CM

## 2018-11-13 DIAGNOSIS — I4891 Unspecified atrial fibrillation: Secondary | ICD-10-CM | POA: Insufficient documentation

## 2018-11-13 DIAGNOSIS — I4892 Unspecified atrial flutter: Secondary | ICD-10-CM | POA: Diagnosis not present

## 2018-11-13 MED ORDER — METOPROLOL TARTRATE 25 MG PO TABS: 37.5000 mg | ORAL_TABLET | Freq: Two times a day (BID) | ORAL | 1 refills | Status: DC

## 2018-11-13 NOTE — Progress Notes (Signed)
Subjective:  Primary Physician:  Velna Hatchet, MD  Patient ID: Jeanette Mcintosh, female    DOB: 10-08-1940, 78 y.o.   MRN: [id number]  Chief Complaint  Patient presents with  . Hypertension    pt says she saw Jg a few years ago  . New Patient (Initial Visit)    HPI: Jeanette Mcintosh  is a 78 y.o. female  with Hypertension, history of atrial fibrillation, In 2018 she presented to the emergency room with flu, was also found to be in what appears to be atrial flutter by EKG but was diagnosed as having atrial fibrillation, spontaneously converted to sinus rhythm, did not want to be on anti-correlation.  I had seen her in 2015, again sometime in 2014 or 2013, she was told to have atrial fibrillation that no documented episodes in spite of extensive research by me at that time.  She wanted to reestablish with me stating that she's had significant variation with a blood pressure, being extremely high at greater than 150 mmHg.  She is also very sensitive to multiple medications, hence wanted to see me.  She has not limited activity due to arthritis in her knees, but denies any specific chest discomfort or shortness of breath.  She has occasional dizziness but no syncope.  Denies symptoms of claudication.  States that her diabetes is well controlled.  She does not want to be on statins.  Past Medical History:  Diagnosis Date  . Afib ([place])   . Arthritis   . Asthma   . Blood transfusion without reported diagnosis   . Cancer ([place])   . Cataract   . COPD (chronic obstructive pulmonary disease) ([place])   . Diabetes mellitus without complication ([employer])   . Eczema   . GERD (gastroesophageal reflux disease)   . Hyperlipidemia   . Hypertension   . IDDM (insulin dependent diabetes mellitus) ([place])   . Irritable bowel syndrome   . Macular degeneration   . PAF (paroxysmal atrial fibrillation) ([place]) 2007  . Paroxysmal atrial fibrillation ([place]) 11/09/2016  . Thyroid disease     Past Surgical History:    Procedure Laterality Date  . ABDOMINAL HYSTERECTOMY  1971   Partial  . APPENDECTOMY  1971  . CATARACT EXTRACTION  2002  . CHOLECYSTECTOMY, LAPAROSCOPIC  2011  . HEMORRHOID SURGERY  1990's   x2  . renal doppler  12/12/06  . TOTAL HIP ARTHROPLASTY  2012   Left  . TOTAL HIP ARTHROPLASTY  2002   Right  . TRIGGER FINGER RELEASE  1990's   Several surgeries.  . US ECHOCARDIOGRAPHY  01/20/12    Social History   Socioeconomic History  . Marital status: Divorced    Spouse name: Not on file  . Number of children: Not on file  . Years of education: Not on file  . Highest education level: Not on file  Occupational History  . Not on file  Social Needs  . Financial resource strain: Not on file  . Food insecurity:    Worry: Not on file    Inability: Not on file  . Transportation needs:    Medical: Not on file    Non-medical: Not on file  Tobacco Use  . Smoking status: Former Smoker    Types: Cigarettes    Start date: 09/14/1950    Last attempt to quit: 09/13/2013    Years since quitting: 5.1  . Smokeless tobacco: Never Used  . Tobacco comment: Pt has stopped and started smoking muliple  times over the years.  Amount varied during the years.  Substance and Sexual Activity  . Alcohol use: Yes    Alcohol/week: 0.0 standard drinks    Comment: 1/Evening  . Drug use: No  . Sexual activity: Not on file  Lifestyle  . Physical activity:    Days per week: Not on file    Minutes per session: Not on file  . Stress: Not on file  Relationships  . Social connections:    Talks on phone: Not on file    Gets together: Not on file    Attends religious service: Not on file    Active member of club or organization: Not on file    Attends meetings of clubs or organizations: Not on file    Relationship status: Not on file  . Intimate partner violence:    Fear of current or ex partner: Not on file    Emotionally abused: Not on file    Physically abused: Not on file    Forced sexual activity: Not  on file  Other Topics Concern  . Not on file  Social History Narrative  . Not on file    Current Outpatient Medications on File Prior to Visit  Medication Sig Dispense Refill  . b complex vitamins tablet Take 1 tablet by mouth daily.    . benzonatate (TESSALON) 100 MG capsule TK ONE C PO  Q 8 H PRN  5  . cetirizine (ZYRTEC) 10 MG tablet Take 10 mg by mouth daily.    . chlorthalidone (HYGROTON) 25 MG tablet Take 0.5 mg by mouth every other day.    . Cholecalciferol (VITAMIN D PO) Take 2,000 Units by mouth daily.     . clonazePAM (KLONOPIN) 1 MG tablet Take 0.5 mg by mouth as needed for anxiety.    Marland Kitchen FOLIC ACID PO Take 885 mcg by mouth daily.     . Garlic 0277 MG CAPS Take 1 capsule by mouth daily.    Marland Kitchen Hyaluronic Acid-Vitamin C (HYALURONIC ACID PO) Take 100 mg by mouth daily.    Marland Kitchen LANTUS SOLOSTAR 100 UNIT/ML injection Inject 0-20 Units into the skin at bedtime. Per BS    . latanoprost (XALATAN) 0.005 % ophthalmic solution Place 1 drop into both eyes at bedtime.     Marland Kitchen levothyroxine (SYNTHROID) 125 MCG tablet Take 1 tablet (125 mcg total) by mouth daily before breakfast. 30 tablet 0  . losartan (COZAAR) 100 MG tablet Take 100 mg by mouth daily.     . meloxicam (MOBIC) 7.5 MG tablet Take 7.5 mg by mouth daily as needed for pain. Reported on 02/13/2016    . metoprolol tartrate (LOPRESSOR) 25 MG tablet Take 25 mg by mouth 2 (two) times daily.    . nitrofurantoin (MACRODANTIN) 100 MG capsule Take 100 mg by mouth as needed.    . NON FORMULARY Take 5 capsules by mouth daily.    . NON FORMULARY Take 11 g by mouth daily.    Marland Kitchen oxymetazoline (AFRIN) 0.05 % nasal spray Place 1 spray into both nostrils 2 (two) times daily.    . Probiotic Product (PRO-BIOTIC BLEND PO) Take 1 capsule by mouth daily.    Marland Kitchen RUTIN PO Take 500 mg by mouth at bedtime.     . triamcinolone (NASACORT AQ) 55 MCG/ACT AERO nasal inhaler 1-2 sprays in each nostril daily (Patient taking differently: Place 1-2 sprays into the nose daily  as needed (for congestion). ) 16.5 mL 3  . TURMERIC PO  Take 1,500 mg by mouth daily.    Marland Kitchen VITAMIN A PO Take 1 capsule by mouth daily.     Marland Kitchen albuterol (PROAIR HFA) 108 (90 Base) MCG/ACT inhaler Inhale 2 puffs into the lungs every 4 (four) hours as needed for wheezing or shortness of breath. 1 Inhaler 3  . albuterol (PROVENTIL) (2.5 MG/3ML) 0.083% nebulizer solution Take 2.5 mg by nebulization every 6 (six) hours as needed for wheezing or shortness of breath.    . budesonide-formoterol (SYMBICORT) 160-4.5 MCG/ACT inhaler Inhale 2 puffs into the lungs 2 (two) times daily. (Patient not taking: Reported on 11/09/2016) 1 Inhaler 2  . buPROPion (WELLBUTRIN SR) 150 MG 12 hr tablet Take 150 mg by mouth daily. Reported on 02/13/2016    . PHENYLEPHRINE HCL PO Take 10 mg by mouth at bedtime.      No current facility-administered medications on file prior to visit.    Review of Systems  Constitutional: Negative for malaise/fatigue and weight loss.  Respiratory: Positive for cough and shortness of breath. Negative for hemoptysis.   Cardiovascular: Negative for chest pain, palpitations, claudication and leg swelling.  Gastrointestinal: Negative for abdominal pain, blood in stool, constipation, heartburn and vomiting.  Genitourinary: Positive for frequency. Negative for dysuria.  Musculoskeletal: Positive for joint pain (bilateral knee). Negative for myalgias.  Neurological: Negative for dizziness, sensory change, focal weakness and headaches.  Endo/Heme/Allergies: Does not bruise/bleed easily.  Psychiatric/Behavioral: Negative for depression. The patient is not nervous/anxious.   All other systems reviewed and are negative.     Objective:  Blood pressure 96/63, pulse 66, height 5\' 2"  (1.575 m), weight 149 lb (67.6 kg), SpO2 96 %. Body mass index is 27.25 kg/m.  Physical Exam  Constitutional: No distress.  Short statu red and petite  HENT:  Head: Atraumatic.  Eyes: Conjunctivae are normal.  Neck: Neck  supple. No JVD present. No thyromegaly present.  Cardiovascular: Normal rate, regular rhythm, normal heart sounds and intact distal pulses. Exam reveals no gallop.  No murmur heard. Pulmonary/Chest: Effort normal and breath sounds normal.  Abdominal: Soft. Bowel sounds are normal.  Mild trunkal obesity present  Musculoskeletal: Normal range of motion.        General: No edema.  Neurological: She is alert.  Skin: Skin is warm and dry.  Psychiatric: She has a normal mood and affect.   CARDIAC STUDIES:  Echocardiogram 11/10/2016: Normal LV systolic function, he is 28-41%.  Normal atrial size.  Mild TR, mild pulmonary hypertension, PA pressure 35 mmHg.  Assessment & Recommendations:   1. Paroxysmal atrial Flutter  CHA2DS2-VASc Score is 5.  -(CHF; HTN; vasc disease DM,  Female = 1; Age <65 =0; 65-74 = 1,  >75 =2; stroke = 2).  -(Yearly risk of stroke: Score of 1=1.3; 2=2.2; 3=3.2; 4=4; 5=6.7; 6=9.8; 7=>9.8)  EKG 11/13/2018: Normal sinus rhythm at rate of 62 bpm, LAE normal axis.  No evidence of ischemia, otherwise normal EKG.  EKG 11/09/2016: Atrial flutter with 2:1 conduction with RVR, ventricle rate 148 bpm.  2. Essential hypertension  3. Type 2 diabetes mellitus with diabetic nephropathy, with long-term current use of insulin ([place])  4. Hypercholesteremia  Recommendation:  Was last seen by me in 2015, sometime in 2014 2013 she was told to have atrial fibrillation but there is no documented atrial fibrillation.  She did present in February 2018 with  influenza at that time was found to have what looks like typical atrial flutter with 2:1 conduction and not atrial flutter.  She does  not want to be on anticoagulation.  Her blood pressure although very well controlled today she is concerned that amlodipine is causing her to have urinary retention and also leg edema.  In view of atrial arrhythmia, I have discontinued amlodipine and increase the dose of metoprolol tartrate from 25 mg  b.i.d. to 37.5 g p.o. b.i.d.  Otherwise she is on appropriate medical therapy.  Diabetes continues to be controlled she does have peripheral neuropathy, lipids although elevated, patient is reluctant to taking statins due to severe myalgia.  She does not want to consider any other therapy for now.  I'll see her back in 4 weeks for follow-up of hypertension.  I do not think she needs any cardiac testing.  I did evaluate her carotid artery duplex performed in 2016 which revealed mild plaque.  This is probably appropriate for age.  The hospital in 2018 was also reviewed.   Adrian Prows, MD, [facility] 11/13/2018, 2:08 PM [facility] [place] Pager: [phone] Office: [phone] If no answer Cell [phone]

## 2018-11-20 DIAGNOSIS — M47816 Spondylosis without myelopathy or radiculopathy, lumbar region: Secondary | ICD-10-CM | POA: Diagnosis not present

## 2018-12-15 ENCOUNTER — Other Ambulatory Visit: Payer: Self-pay

## 2018-12-15 ENCOUNTER — Ambulatory Visit (INDEPENDENT_AMBULATORY_CARE_PROVIDER_SITE_OTHER): Payer: PPO | Admitting: Cardiology

## 2018-12-15 ENCOUNTER — Encounter: Payer: Self-pay | Admitting: Cardiology

## 2018-12-15 VITALS — BP 114/73 | HR 70 | Ht 62.0 in | Wt 144.0 lb

## 2018-12-15 DIAGNOSIS — I483 Typical atrial flutter: Secondary | ICD-10-CM | POA: Diagnosis not present

## 2018-12-15 DIAGNOSIS — I10 Essential (primary) hypertension: Secondary | ICD-10-CM

## 2018-12-15 NOTE — Progress Notes (Signed)
Virtual Visit via Video Note: This visit type was conducted due to national recommendations for restrictions regarding the COVID-19 Pandemic (e.g. social distancing).  This format is felt to be most appropriate for this patient at this time.  All issues noted in this document were discussed and addressed.  No physical exam was performed (except for noted visual exam findings with Telehealth visits).  The patient has consented to conduct a Telehealth visit and understands insurance will be billed.   I connected with@, on 12/15/18 at  by a video enabled telemedicine application and verified that I am speaking with the correct person using two identifiers.   I discussed the limitations of evaluation and management by telemedicine and the availability of in person appointments. The patient expressed understanding and agreed to proceed.   I have discussed with patient regarding the safety during COVID Pandemic and steps and precautions to be taken including social distancing, frequent hand wash and use of detergent soap, gels with the patient. I asked the patient to avoid touching mouth, nose, eyes, ears with her hands. I encouraged regular walking around the neighborhood and exercise and regular diet, as long as social distancing can be maintained.   Subjective:  Primary Physician:  Velna Hatchet, MD  Patient ID: Jeanette Mcintosh, female    DOB: 1940-11-03, 78 y.o.   MRN: [id number]  Chief Complaint  Patient presents with   Hypertension   Follow-up    4wk    HPI: Jeanette Mcintosh  is a 78 y.o. female  with Hypertension, history of atrial fibrillation, In 2018 she presented to the emergency room with flu, was also found to be in what appears to be typical atrial flutter by EKG but was diagnosed as having atrial fibrillation, spontaneously converted to sinus rhythm, did not want to be on anti-coagulation. In 2014 or 2013, she was told to have atrial fibrillation that no documented episodes in spite  of extensive research by me at that time.  It seen her about 6 weeks ago, this is a virtual visit follow-up of hypertension and also atrial fibrillation/flutter.  States that she's doing well, she has not had any palpitations, it increased metoprolol tartrate to 37.5 mg b.i.d.  She is very reluctant to taking any new medications.  She is tolerating the present medications without any side effects. In spite of diabetes and vascular risk factors, she is reluctant to taking statins. She complains of prominent veins in the leg with no pain or swelling.  Past Medical History:  Diagnosis Date   Afib ([place])    Arthritis    Asthma    Blood transfusion without reported diagnosis    Cancer ([place])    Cataract    COPD (chronic obstructive pulmonary disease) ([place])    Diabetes mellitus without complication ([facility])    Eczema    GERD (gastroesophageal reflux disease)    Hyperlipidemia    Hypertension    IDDM (insulin dependent diabetes mellitus) ([place])    Irritable bowel syndrome    Macular degeneration    PAF (paroxysmal atrial fibrillation) ([place]) 2007   Paroxysmal atrial fibrillation ([place]) 11/09/2016   Thyroid disease     Past Surgical History:  Procedure Laterality Date   ABDOMINAL HYSTERECTOMY  1971   Partial   APPENDECTOMY  1971   CATARACT EXTRACTION  2002   CHOLECYSTECTOMY, LAPAROSCOPIC  2011   HEMORRHOID SURGERY  1990's   x2   renal doppler  12/12/06   TOTAL HIP ARTHROPLASTY  2012  Left   TOTAL HIP ARTHROPLASTY  2002   Right   TRIGGER FINGER RELEASE  1990's   Several surgeries.   US ECHOCARDIOGRAPHY  01/20/12    Social History   Socioeconomic History   Marital status: Widowed    Spouse name: Not on file   Number of children: Not on file   Years of education: 6   Highest education level: Not on file  Occupational History   Not on file  Social Needs   Financial resource strain: Not on file   Food insecurity:    Worry: Not on file     Inability: Not on file   Transportation needs:    Medical: Not on file    Non-medical: Not on file  Tobacco Use   Smoking status: Former Smoker    Packs/day: 0.25    Years: 25.00    Pack years: 6.25    Types: Cigarettes    Start date: 09/14/1950    Last attempt to quit: 09/13/2013    Years since quitting: 5.2   Smokeless tobacco: Never Used   Tobacco comment: Pt has stopped and started smoking muliple times over the years.  Amount varied during the years.  Substance and Sexual Activity   Alcohol use: Yes    Alcohol/week: 0.0 standard drinks    Comment: 1/Evening   Drug use: No   Sexual activity: Not on file  Lifestyle   Physical activity:    Days per week: Not on file    Minutes per session: Not on file   Stress: Not on file  Relationships   Social connections:    Talks on phone: Not on file    Gets together: Not on file    Attends religious service: Not on file    Active member of club or organization: Not on file    Attends meetings of clubs or organizations: Not on file    Relationship status: Not on file   Intimate partner violence:    Fear of current or ex partner: Not on file    Emotionally abused: Not on file    Physically abused: Not on file    Forced sexual activity: Not on file  Other Topics Concern   Not on file  Social History Narrative   Not on file    Current Outpatient Medications on File Prior to Visit  Medication Sig Dispense Refill   albuterol (PROAIR HFA) 108 (90 Base) MCG/ACT inhaler Inhale 2 puffs into the lungs every 4 (four) hours as needed for wheezing or shortness of breath. 1 Inhaler 3   albuterol (PROVENTIL) (2.5 MG/3ML) 0.083% nebulizer solution Take 2.5 mg by nebulization every 6 (six) hours as needed for wheezing or shortness of breath.     b complex vitamins tablet Take 1 tablet by mouth daily.     benzonatate (TESSALON) 100 MG capsule Take 100 mg by mouth as needed.   5   buPROPion (WELLBUTRIN SR) 150 MG 12 hr tablet  Take 150 mg by mouth daily. Reported on 02/13/2016     cetirizine (ZYRTEC) 10 MG tablet Take 10 mg by mouth daily.     chlorthalidone (HYGROTON) 25 MG tablet Take 0.5 mg by mouth daily.      Cholecalciferol (VITAMIN D PO) Take 2,000 Units by mouth daily.      clonazePAM (KLONOPIN) 1 MG tablet Take 0.5 mg by mouth daily.      FOLIC ACID PO Take 672 mcg by mouth daily.  Garlic 9735 MG CAPS Take 1 capsule by mouth daily.     guaiFENesin (MUCINEX) 600 MG 12 hr tablet Take 1,200 mg by mouth 2 (two) times daily.     Hyaluronic Acid-Vitamin C (HYALURONIC ACID PO) Take 100 mg by mouth daily.     LANTUS SOLOSTAR 100 UNIT/ML injection Inject 8-20 Units into the skin at bedtime. Per BS     latanoprost (XALATAN) 0.005 % ophthalmic solution Place 1 drop into both eyes at bedtime.      levothyroxine (SYNTHROID) 125 MCG tablet Take 1 tablet (125 mcg total) by mouth daily before breakfast. 30 tablet 0   losartan (COZAAR) 100 MG tablet Take 100 mg by mouth daily.      meloxicam (MOBIC) 7.5 MG tablet Take 7.5 mg by mouth daily as needed for pain. Reported on 02/13/2016     metoprolol tartrate (LOPRESSOR) 25 MG tablet TAKE 1 AND 1/2 TABLETS(37.5 MG) BY MOUTH TWICE DAILY 270 tablet 1   nitrofurantoin (MACRODANTIN) 100 MG capsule Take 100 mg by mouth daily.      NON FORMULARY Take 5 capsules by mouth daily. Collagen     NON FORMULARY Take 11 g by mouth daily. [place]     oxymetazoline (AFRIN) 0.05 % nasal spray Place 1 spray into both nostrils 2 (two) times daily.     Probiotic Product (PRO-BIOTIC BLEND PO) Take 1 capsule by mouth daily.     RUTIN PO Take 500 mg by mouth at bedtime.      triamcinolone (NASACORT AQ) 55 MCG/ACT AERO nasal inhaler 1-2 sprays in each nostril daily (Patient not taking: Reported on 12/14/2018) 16.5 mL 3   TURMERIC PO Take 1,500 mg by mouth daily.     VITAMIN A PO Take 1 capsule by mouth daily.      vitamin C (ASCORBIC ACID) 250 MG tablet Take 250 mg by mouth  daily.     No current facility-administered medications on file prior to visit.    Review of Systems  Constitutional: Negative for malaise/fatigue and weight loss.  Respiratory: Positive for cough (copd and chronic) and shortness of breath (chronic and stable). Negative for hemoptysis.   Cardiovascular: Negative for chest pain, palpitations, claudication and leg swelling.  Gastrointestinal: Negative for abdominal pain, blood in stool, constipation, heartburn and vomiting.  Genitourinary: Positive for frequency (chronic). Negative for dysuria.  Musculoskeletal: Positive for joint pain (bilateral knee). Negative for myalgias.  Neurological: Negative for dizziness, sensory change, focal weakness and headaches.  Endo/Heme/Allergies: Does not bruise/bleed easily.  Psychiatric/Behavioral: Negative for depression. The patient is not nervous/anxious.   All other systems reviewed and are negative.     Objective:  Blood pressure 114/73, pulse 70, height 5\' 2"  (1.575 m), weight 144 lb (65.3 kg). Body mass index is 26.34 kg/m.  Physical Exam  Constitutional: She is oriented to person, place, and time. No distress.  Moderately built and nurished  Pulmonary/Chest: Effort normal.  Musculoskeletal:        General: No edema.  Neurological: She is alert and oriented to person, place, and time.  Psychiatric: She has a normal mood and affect.   CARDIAC STUDIES:  Echocardiogram 11/10/2016: Normal LV systolic function, he is 32-99%.  Normal atrial size.  Mild TR, mild pulmonary hypertension, PA pressure 35 mmHg.  Assessment & Recommendations:   Essential hypertension  Typical atrial flutter ([facility]) - EKG 11/09/2016: Atrial flutter with 2:1 conduction with RVR, ventricle rate 148 bpm.  CHA2DS2-VASc Score is 5.  -(CHF; HTN; vasc disease DM,  Female = 1; Age <65 =0; 65-74 = 1,  >75 =2; stroke = 2).  -(Yearly risk of stroke: Score of 1=1.3; 2=2.2; 3=3.2; 4=4; 5=6.7; 6=9.8; 7=>9.8)  EKG 11/13/2018:  Normal sinus rhythm at rate of 62 bpm, LAE normal axis.  No evidence of ischemia, otherwise normal EKG.  EKG 11/09/2016: Atrial flutter with 2:1 conduction with RVR, ventricle rate 148 bpm.  Recommendation:  She has a diagnosis of atrial fibrillation sometime in 2013, I do not have EKG tracings. She did present in February 2018 with  influenza at that time EKG revealed typical atrial flutter with 2:1 conduction. She does not want to be on anticoagulation. She is now tolerating metoprolol tartrate 37.5 g b.i.d. without side effects, she is also restarted chlorthalidone due to elevated blood pressure and since then has noticed blood pressure to be well-controlled.  Diabetes mellitus has been slightly elevated blood sugar recently as per patient.  She be careful with regard to Covid.  She essentially is asymptomatic except for chronic dyspnea.  She appeared well on the video.  No changes were done by me today, I'll see her back in 3 months for in person visit.  This was a virtual visit.    Diabetes continues to be controlled she does have peripheral neuropathy, lipids although elevated, patient is reluctant to taking statins due to severe myalgia.  She does not want to consider any other therapy for now.  I have discussed with patient regarding the safety during COVID Pandemic and steps and precautions to be taken including social distancing, frequent hand wash and use of detergent soap, gels with the patient. I asked the patient to avoid touching mouth, nose, eyes, ears with her hands. I encouraged regular walking around the neighborhood and exercise and regular diet, as long as social distancing can be maintained.   Adrian Prows, MD, [facility] 12/15/2018, 2:39 PM [facility]. [place] Pager: [phone] Office: [phone] If no answer Cell [phone]

## 2019-01-12 DIAGNOSIS — Z8541 Personal history of malignant neoplasm of cervix uteri: Secondary | ICD-10-CM | POA: Diagnosis not present

## 2019-01-12 DIAGNOSIS — N3941 Urge incontinence: Secondary | ICD-10-CM | POA: Diagnosis not present

## 2019-01-15 DIAGNOSIS — H401131 Primary open-angle glaucoma, bilateral, mild stage: Secondary | ICD-10-CM | POA: Diagnosis not present

## 2019-02-07 DIAGNOSIS — J441 Chronic obstructive pulmonary disease with (acute) exacerbation: Secondary | ICD-10-CM | POA: Diagnosis not present

## 2019-03-08 ENCOUNTER — Ambulatory Visit (INDEPENDENT_AMBULATORY_CARE_PROVIDER_SITE_OTHER): Payer: PPO | Admitting: Adult Health

## 2019-03-08 ENCOUNTER — Other Ambulatory Visit: Payer: Self-pay

## 2019-03-08 ENCOUNTER — Encounter: Payer: Self-pay | Admitting: Adult Health

## 2019-03-08 VITALS — BP 118/64 | Temp 97.9°F | Wt 147.0 lb

## 2019-03-08 DIAGNOSIS — I4892 Unspecified atrial flutter: Secondary | ICD-10-CM | POA: Diagnosis not present

## 2019-03-08 DIAGNOSIS — Z794 Long term (current) use of insulin: Secondary | ICD-10-CM

## 2019-03-08 DIAGNOSIS — E079 Disorder of thyroid, unspecified: Secondary | ICD-10-CM

## 2019-03-08 DIAGNOSIS — I10 Essential (primary) hypertension: Secondary | ICD-10-CM

## 2019-03-08 DIAGNOSIS — Z7689 Persons encountering health services in other specified circumstances: Secondary | ICD-10-CM | POA: Diagnosis not present

## 2019-03-08 DIAGNOSIS — J449 Chronic obstructive pulmonary disease, unspecified: Secondary | ICD-10-CM | POA: Diagnosis not present

## 2019-03-08 DIAGNOSIS — E782 Mixed hyperlipidemia: Secondary | ICD-10-CM | POA: Diagnosis not present

## 2019-03-08 DIAGNOSIS — E1121 Type 2 diabetes mellitus with diabetic nephropathy: Secondary | ICD-10-CM

## 2019-03-08 DIAGNOSIS — N189 Chronic kidney disease, unspecified: Secondary | ICD-10-CM | POA: Diagnosis not present

## 2019-03-08 DIAGNOSIS — N179 Acute kidney failure, unspecified: Secondary | ICD-10-CM | POA: Diagnosis not present

## 2019-03-08 LAB — POCT URINALYSIS DIPSTICK
Bilirubin, UA: NEGATIVE
Blood, UA: NEGATIVE
Glucose, UA: NEGATIVE
Ketones, UA: NEGATIVE
Leukocytes, UA: NEGATIVE
Nitrite, UA: NEGATIVE
Odor: NEGATIVE
Protein, UA: NEGATIVE
Spec Grav, UA: 1.02 (ref 1.010–1.025)
Urobilinogen, UA: 0.2 E.U./dL
pH, UA: 6 (ref 5.0–8.0)

## 2019-03-08 LAB — BASIC METABOLIC PANEL
BUN: 19 mg/dL (ref 6–23)
CO2: 31 mEq/L (ref 19–32)
Calcium: 9.4 mg/dL (ref 8.4–10.5)
Chloride: 94 mEq/L — ABNORMAL LOW (ref 96–112)
Creatinine, Ser: 0.95 mg/dL (ref 0.40–1.20)
GFR: 56.93 mL/min — ABNORMAL LOW (ref >60.00)
Glucose, Bld: 115 mg/dL — ABNORMAL HIGH (ref 70–99)
Potassium: 4.9 mEq/L (ref 3.5–5.1)
Sodium: 131 mEq/L — ABNORMAL LOW (ref 135–145)

## 2019-03-08 LAB — HEMOGLOBIN A1C: Hgb A1c MFr Bld: 6.7 % — ABNORMAL HIGH (ref 4.6–6.5)

## 2019-03-08 NOTE — Progress Notes (Signed)
Patient presents to clinic today to establish care. She is a pleasant 78 year old female who  has a past medical history of Acute kidney injury superimposed on chronic kidney disease ([place]) (11/09/2016), Afib ([place]), Arthritis, Asthma, Blood transfusion without reported diagnosis, Cancer ([place]), Cataract, COPD (chronic obstructive pulmonary disease) ([place]), Diabetes mellitus without complication ([place]), Eczema, GERD (gastroesophageal reflux disease), Hyperlipidemia, Hypertension, IDDM (insulin dependent diabetes mellitus) ([place]), Irritable bowel syndrome, Macular degeneration, PAF (paroxysmal atrial fibrillation) ([place]) (2007), Paroxysmal atrial fibrillation ([place]) (11/09/2016), and Thyroid disease.   She is transferring from Dr. Ardeth Perfect at [facility]  Her last CPE was July 2019   Acute Concerns: Establish Care  Chronic Issues: Paroxysmal Atrial Flutter -- managed by Dr. Einar Gip at [facility].  He does not want to be on anticoagulation.  And currently prescribed metoprolol 37.5 mg twice daily.  Her last  EKG 11/13/2018 showed normal sinus rhythm with a rate of 62.  She denies chest pain.  She does not want to take statins due to myalgia  Hypertension -appears as though this is also managed by cardiology.  Is currently prescribed metoprolol 37.5 mg daily, chlorthalidone 12.5 mg every other day, and losartan 100 mg nightly.  Blood pressure is well controlled at home.  She denies headaches, dizziness, lightheadedness, or syncopal episodes. BP Readings from Last 3 Encounters:  03/08/19 118/64  12/15/18 114/73  11/13/18 96/63   DM -insulin-dependent.  Currently prescribed Lantus sliding scale.  She does report good compliance with insulin administration.  Her last A1c in January was 6.6.  Has noticed her blood sugars have increased during [place] pandemic but if she is trying to eat healthy.  She does endorse peripheral neuropathy.   Hypothyroidism -takes Synthroid 125 mcg daily.   Chronic urinary  tract infection-managed by urology Dr. Vikki Ports.  On chronic nitrofurantoin 100 mg daily.  Insomnia -currently prescribed clonazepam 0.5 2.75 mg nightly.  Reports that she sleeps approximately 6 hours a night, if she takes any more than the dose that she is currently taking she will fatigued the entire day.  COPD -most recently quit smoking approximately a year ago.  She will use an albuterol inhaler occasionally and Symbicort twice daily as needed.  She reports that Symbicort causes her developed sores in her mouth.  She does not want to try a different inhaler at this time  Smoking Cessation -takes Wellbutrin 150 mg as needed.  She reports that she will take this medication when she has a craving for cigarette and this seems to work for her.  She does not take this medication on a daily basis.  Glaucoma/Macular Degeneration-seen by ophthalmology.  Currently prescribed latanoprost eyedrops  CKD III- Does not see nephrology.    Health Maintenance: Dental -- Does not do routine care Vision -- Routine Care Immunizations -- UTD Colonoscopy -- No longer needs due to age  47 -- No longer needed PAP -- No longer needs.  Bone Density -- last was in 2019    Past Medical History:  Diagnosis Date  . Acute kidney injury superimposed on chronic kidney disease ([place]) 11/09/2016  . Afib ([place])   . Arthritis   . Asthma   . Blood transfusion without reported diagnosis   . Cancer (HCC)    Cervical Cancer   . Cataract   . COPD (chronic obstructive pulmonary disease) ([place])   . Diabetes mellitus without complication ([place])   . Eczema   . GERD (gastroesophageal reflux disease)   . Hyperlipidemia   .  Hypertension   . IDDM (insulin dependent diabetes mellitus) ([place])   . Irritable bowel syndrome   . Macular degeneration   . PAF (paroxysmal atrial fibrillation) ([place]) 2007  . Paroxysmal atrial fibrillation ([place]) 11/09/2016  . Thyroid disease     Past Surgical History:  Procedure Laterality  Date  . ABDOMINAL HYSTERECTOMY  1971   Partial  . APPENDECTOMY  1971  . CATARACT EXTRACTION  2002  . CHOLECYSTECTOMY, LAPAROSCOPIC  2011  . HEMORRHOID SURGERY  1990's   x2  . renal doppler  12/12/06  . TOTAL HIP ARTHROPLASTY  2012   Left  . TOTAL HIP ARTHROPLASTY  2002   Right  . TRIGGER FINGER RELEASE  1990's   Several surgeries.  . US ECHOCARDIOGRAPHY  01/20/12    Current Outpatient Medications on File Prior to Visit  Medication Sig Dispense Refill  . albuterol (PROAIR HFA) 108 (90 Base) MCG/ACT inhaler Inhale 2 puffs into the lungs every 4 (four) hours as needed for wheezing or shortness of breath. 1 Inhaler 3  . albuterol (PROVENTIL) (2.5 MG/3ML) 0.083% nebulizer solution Take 2.5 mg by nebulization every 6 (six) hours as needed for wheezing or shortness of breath.    Marland Kitchen b complex vitamins tablet Take 1 tablet by mouth daily.    . benzonatate (TESSALON) 100 MG capsule Take 100 mg by mouth as needed.   5  . buPROPion (WELLBUTRIN SR) 150 MG 12 hr tablet Take 150 mg by mouth daily. Reported on 02/13/2016    . cetirizine (ZYRTEC) 10 MG tablet Take 10 mg by mouth daily.    . chlorthalidone (HYGROTON) 25 MG tablet Take 0.5 mg by mouth daily.     . Cholecalciferol (VITAMIN D PO) Take 2,000 Units by mouth daily.     . clonazePAM (KLONOPIN) 1 MG tablet Take 0.5 mg by mouth daily.     Marland Kitchen FOLIC ACID PO Take 814 mcg by mouth daily.     . Garlic 4818 MG CAPS Take 1 capsule by mouth daily.    Marland Kitchen guaiFENesin (MUCINEX) 600 MG 12 hr tablet Take 1,200 mg by mouth 2 (two) times daily.    Marland Kitchen Hyaluronic Acid-Vitamin C (HYALURONIC ACID PO) Take 100 mg by mouth daily.    Marland Kitchen LANTUS SOLOSTAR 100 UNIT/ML injection Inject 8-20 Units into the skin at bedtime. Per BS    . latanoprost (XALATAN) 0.005 % ophthalmic solution Place 1 drop into both eyes at bedtime.     Marland Kitchen levothyroxine (SYNTHROID) 125 MCG tablet Take 1 tablet (125 mcg total) by mouth daily before breakfast. 30 tablet 0  . losartan (COZAAR) 100 MG tablet  Take 100 mg by mouth daily.     . metoprolol tartrate (LOPRESSOR) 25 MG tablet TAKE 1 AND 1/2 TABLETS(37.5 MG) BY MOUTH TWICE DAILY 270 tablet 1  . nitrofurantoin (MACRODANTIN) 100 MG capsule Take 100 mg by mouth daily.     . NON FORMULARY Take 5 capsules by mouth daily. Collagen    . NON FORMULARY Take 11 g by mouth daily. [place]    . oxymetazoline (AFRIN) 0.05 % nasal spray Place 1 spray into both nostrils 2 (two) times daily.    . Probiotic Product (PRO-BIOTIC BLEND PO) Take 1 capsule by mouth daily.    Marland Kitchen RUTIN PO Take 500 mg by mouth at bedtime.     . triamcinolone (NASACORT AQ) 55 MCG/ACT AERO nasal inhaler 1-2 sprays in each nostril daily 16.5 mL 3  . TURMERIC PO Take 1,500 mg by mouth  daily.    Marland Kitchen VITAMIN A PO Take 1 capsule by mouth daily.     . vitamin C (ASCORBIC ACID) 250 MG tablet Take 250 mg by mouth daily.     No current facility-administered medications on file prior to visit.     Allergies  Allergen Reactions  . Morphine And Related Nausea Only  . Phenergan [Promethazine Hcl] Other (See Comments)    Restless leg  . Ambien [Zolpidem] Other (See Comments)    Unknown  . Dicyclomine Other (See Comments)    constipation Other reaction(s): GI Upset (intolerance) constipation  . Diphenhydramine Other (See Comments)    Restless leg Restless leg  . Erythromycin Nausea And Vomiting  . Ivp Dye [Iodinated Diagnostic Agents] Nausea And Vomiting  . Ondansetron Other (See Comments)    Unknown  . Propoxyphene Other (See Comments)    Unknown  . Shellfish Allergy Nausea And Vomiting  . Statins Other (See Comments)    Unknown  . Vitamin E Other (See Comments)    Hot flashes    Family History  Problem Relation Age of Onset  . Basal cell carcinoma Father   . Dementia Father   . Parkinson's disease Father   . Cancer Other        All (5) had Hysterectomies and high dose Estrogen in 1950's were all diagnosed with Breast Cancer.  . Glaucoma Mother   . Allergic rhinitis  Mother   . Parkinson's disease Paternal Grandmother   . Eczema Son     Social History   Socioeconomic History  . Marital status: Widowed    Spouse name: Not on file  . Number of children: Not on file  . Years of education: 6  . Highest education level: Not on file  Occupational History  . Not on file  Social Needs  . Financial resource strain: Not on file  . Food insecurity    Worry: Not on file    Inability: Not on file  . Transportation needs    Medical: Not on file    Non-medical: Not on file  Tobacco Use  . Smoking status: Former Smoker    Packs/day: 0.25    Years: 25.00    Pack years: 6.25    Types: Cigarettes    Start date: 09/14/1950    Quit date: 09/13/2013    Years since quitting: 5.4  . Smokeless tobacco: Never Used  . Tobacco comment: Pt has stopped and started smoking muliple times over the years.  Amount varied during the years.  Substance and Sexual Activity  . Alcohol use: Yes    Alcohol/week: 0.0 standard drinks    Comment: 1/Evening  . Drug use: No  . Sexual activity: Not on file  Lifestyle  . Physical activity    Days per week: Not on file    Minutes per session: Not on file  . Stress: Not on file  Relationships  . Social Herbalist on phone: Not on file    Gets together: Not on file    Attends religious service: Not on file    Active member of club or organization: Not on file    Attends meetings of clubs or organizations: Not on file    Relationship status: Not on file  . Intimate partner violence    Fear of current or ex partner: Not on file    Emotionally abused: Not on file    Physically abused: Not on file    Forced sexual activity:  Not on file  Other Topics Concern  . Not on file  Social History Narrative  . Not on file    Review of Systems  Constitutional: Negative.   HENT: Negative.   Eyes: Positive for blurred vision.  Respiratory: Positive for cough (chronic) and shortness of breath (chronic).   Cardiovascular:  Negative.   Gastrointestinal: Negative.   Genitourinary: Positive for frequency (chronic).  Musculoskeletal: Positive for joint pain (bilateral knees).  Skin: Negative.   Neurological: Negative.   Endo/Heme/Allergies: Negative.   Psychiatric/Behavioral: The patient is nervous/anxious and has insomnia.   All other systems reviewed and are negative.   BP 118/64   Temp 97.9 F (36.6 C)   Wt 147 lb (66.7 kg)   BMI 26.89 kg/m   Physical Exam Vitals signs and nursing note reviewed.  Constitutional:      Appearance: Normal appearance.  HENT:     Head: Normocephalic and atraumatic.  Cardiovascular:     Rate and Rhythm: Normal rate and regular rhythm.     Pulses: Normal pulses.     Heart sounds: Normal heart sounds.  Pulmonary:     Effort: Pulmonary effort is normal.     Breath sounds: Normal breath sounds.  Skin:    General: Skin is warm and dry.     Capillary Refill: Capillary refill takes less than 2 seconds.  Neurological:     General: No focal deficit present.     Mental Status: She is alert and oriented to person, place, and time.  Psychiatric:        Mood and Affect: Mood normal.        Behavior: Behavior normal.        Thought Content: Thought content normal.        Judgment: Judgment normal.     Recent Results (from the past 2160 hour(s))  Hemoglobin A1c     Status: Abnormal   Collection Time: 03/08/19 12:14 PM  Result Value Ref Range   Hgb A1c MFr Bld 6.7 (H) 4.6 - 6.5 %    Comment: Glycemic Control Guidelines for People with Diabetes:Non Diabetic:  <6%Goal of Therapy: <7%Additional Action Suggested:  >2%   Basic Metabolic Panel     Status: Abnormal   Collection Time: 03/08/19 12:14 PM  Result Value Ref Range   Sodium 131 (L) 135 - 145 mEq/L   Potassium 4.9 3.5 - 5.1 mEq/L   Chloride 94 (L) 96 - 112 mEq/L   CO2 31 19 - 32 mEq/L   Glucose, Bld 115 (H) 70 - 99 mg/dL   BUN 19 6 - 23 mg/dL   Creatinine, Ser 0.95 0.40 - 1.20 mg/dL   Calcium 9.4 8.4 - 10.5  mg/dL   GFR 56.93 (L) >60.00 mL/min  POC Urinalysis Dipstick     Status: None   Collection Time: 03/08/19 12:41 PM  Result Value Ref Range   Color, UA yellow    Clarity, UA clear    Glucose, UA Negative Negative   Bilirubin, UA n    Ketones, UA n    Spec Grav, UA 1.020 1.010 - 1.025   Blood, UA n    pH, UA 6.0 5.0 - 8.0   Protein, UA Negative Negative   Urobilinogen, UA 0.2 0.2 or 1.0 E.U./dL   Nitrite, UA n    Leukocytes, UA Negative Negative   Appearance clear    Odor neg     Assessment/Plan: 1. Type 2 diabetes mellitus with diabetic nephropathy, with long-term current use of  insulin (HCC)  - Hemoglobin M0H - Basic Metabolic Panel - POC Urinalysis Dipstick  2. Encounter to establish care -Sensitivity to many medications which makes her reluctant to start any new medications.   -We will have her follow-up in July for complete physical exam -Courage to exercising heart healthy diet  3. Essential hypertension -Well-controlled no change in medication - Hemoglobin W8G - Basic Metabolic Panel - POC Urinalysis Dipstick  4. Thyroid disease -No change in Synthroid dose at this time  5. Paroxysmal atrial flutter ([place]) -Follow-up with cardiology as directed  6. Mixed hyperlipidemia -Refuses statin despite cardiac risk factors and diabetes -Will continue to monitor  7. Chronic obstructive pulmonary disease, unspecified COPD type ([place]) - Continue with inhaler  8. Acute kidney injury superimposed on chronic kidney disease ([place])  - Hemoglobin S8P - Basic Metabolic Panel - POC Urinalysis Dipstick   Dorothyann Peng, NP

## 2019-03-08 NOTE — Patient Instructions (Signed)
It was great meeting you today   We will follow up with you regarding your blood work   Please schedule your physical exam at the end of the summer.

## 2019-03-09 ENCOUNTER — Encounter: Payer: Self-pay | Admitting: Adult Health

## 2019-03-09 DIAGNOSIS — I10 Essential (primary) hypertension: Secondary | ICD-10-CM | POA: Insufficient documentation

## 2019-03-09 DIAGNOSIS — E785 Hyperlipidemia, unspecified: Secondary | ICD-10-CM | POA: Insufficient documentation

## 2019-03-09 DIAGNOSIS — H3530 Unspecified macular degeneration: Secondary | ICD-10-CM | POA: Insufficient documentation

## 2019-03-09 DIAGNOSIS — J449 Chronic obstructive pulmonary disease, unspecified: Secondary | ICD-10-CM | POA: Insufficient documentation

## 2019-03-09 DIAGNOSIS — E1159 Type 2 diabetes mellitus with other circulatory complications: Secondary | ICD-10-CM | POA: Insufficient documentation

## 2019-03-09 DIAGNOSIS — I4892 Unspecified atrial flutter: Secondary | ICD-10-CM | POA: Insufficient documentation

## 2019-03-09 DIAGNOSIS — E079 Disorder of thyroid, unspecified: Secondary | ICD-10-CM | POA: Insufficient documentation

## 2019-03-09 DIAGNOSIS — E039 Hypothyroidism, unspecified: Secondary | ICD-10-CM | POA: Insufficient documentation

## 2019-03-22 ENCOUNTER — Encounter: Payer: Self-pay | Admitting: Family Medicine

## 2019-04-04 DIAGNOSIS — D225 Melanocytic nevi of trunk: Secondary | ICD-10-CM | POA: Diagnosis not present

## 2019-04-04 DIAGNOSIS — D2261 Melanocytic nevi of right upper limb, including shoulder: Secondary | ICD-10-CM | POA: Diagnosis not present

## 2019-04-04 DIAGNOSIS — L821 Other seborrheic keratosis: Secondary | ICD-10-CM | POA: Diagnosis not present

## 2019-04-04 DIAGNOSIS — D485 Neoplasm of uncertain behavior of skin: Secondary | ICD-10-CM | POA: Diagnosis not present

## 2019-04-06 ENCOUNTER — Ambulatory Visit: Payer: PPO | Admitting: Cardiology

## 2019-04-06 DIAGNOSIS — N3020 Other chronic cystitis without hematuria: Secondary | ICD-10-CM | POA: Diagnosis not present

## 2019-04-17 DIAGNOSIS — H401131 Primary open-angle glaucoma, bilateral, mild stage: Secondary | ICD-10-CM | POA: Diagnosis not present

## 2019-04-30 DIAGNOSIS — H353131 Nonexudative age-related macular degeneration, bilateral, early dry stage: Secondary | ICD-10-CM | POA: Diagnosis not present

## 2019-04-30 DIAGNOSIS — H35033 Hypertensive retinopathy, bilateral: Secondary | ICD-10-CM | POA: Diagnosis not present

## 2019-04-30 DIAGNOSIS — H35423 Microcystoid degeneration of retina, bilateral: Secondary | ICD-10-CM | POA: Diagnosis not present

## 2019-04-30 DIAGNOSIS — H43813 Vitreous degeneration, bilateral: Secondary | ICD-10-CM | POA: Diagnosis not present

## 2019-05-01 ENCOUNTER — Ambulatory Visit (INDEPENDENT_AMBULATORY_CARE_PROVIDER_SITE_OTHER): Payer: PPO | Admitting: Adult Health

## 2019-05-01 ENCOUNTER — Encounter: Payer: Self-pay | Admitting: Adult Health

## 2019-05-01 VITALS — BP 122/66 | Temp 98.0°F | Ht 63.0 in | Wt 142.0 lb

## 2019-05-01 DIAGNOSIS — N183 Chronic kidney disease, stage 3 unspecified: Secondary | ICD-10-CM

## 2019-05-01 DIAGNOSIS — I10 Essential (primary) hypertension: Secondary | ICD-10-CM

## 2019-05-01 DIAGNOSIS — E1121 Type 2 diabetes mellitus with diabetic nephropathy: Secondary | ICD-10-CM

## 2019-05-01 DIAGNOSIS — M150 Primary generalized (osteo)arthritis: Secondary | ICD-10-CM

## 2019-05-01 DIAGNOSIS — F5101 Primary insomnia: Secondary | ICD-10-CM

## 2019-05-01 DIAGNOSIS — M159 Polyosteoarthritis, unspecified: Secondary | ICD-10-CM

## 2019-05-01 DIAGNOSIS — Z794 Long term (current) use of insulin: Secondary | ICD-10-CM

## 2019-05-01 DIAGNOSIS — Z0000 Encounter for general adult medical examination without abnormal findings: Secondary | ICD-10-CM

## 2019-05-01 DIAGNOSIS — E782 Mixed hyperlipidemia: Secondary | ICD-10-CM | POA: Diagnosis not present

## 2019-05-01 DIAGNOSIS — Z716 Tobacco abuse counseling: Secondary | ICD-10-CM | POA: Diagnosis not present

## 2019-05-01 DIAGNOSIS — E079 Disorder of thyroid, unspecified: Secondary | ICD-10-CM | POA: Diagnosis not present

## 2019-05-01 DIAGNOSIS — J449 Chronic obstructive pulmonary disease, unspecified: Secondary | ICD-10-CM

## 2019-05-01 DIAGNOSIS — M8949 Other hypertrophic osteoarthropathy, multiple sites: Secondary | ICD-10-CM

## 2019-05-01 LAB — COMPREHENSIVE METABOLIC PANEL
ALT: 13 U/L (ref 0–35)
AST: 18 U/L (ref 0–37)
Albumin: 4.3 g/dL (ref 3.5–5.2)
Alkaline Phosphatase: 77 U/L (ref 39–117)
BUN: 16 mg/dL (ref 6–23)
CO2: 30 mEq/L (ref 19–32)
Calcium: 9.4 mg/dL (ref 8.4–10.5)
Chloride: 88 mEq/L — ABNORMAL LOW (ref 96–112)
Creatinine, Ser: 0.9 mg/dL (ref 0.40–1.20)
GFR: 60.57 mL/min (ref >60.00)
Glucose, Bld: 87 mg/dL (ref 70–99)
Potassium: 3.6 mEq/L (ref 3.5–5.1)
Sodium: 126 mEq/L — ABNORMAL LOW (ref 135–145)
Total Bilirubin: 0.9 mg/dL (ref 0.2–1.2)
Total Protein: 6.8 g/dL (ref 6.0–8.3)

## 2019-05-01 LAB — CBC WITH DIFFERENTIAL/PLATELET
Basophils Absolute: 0.1 10*3/uL (ref 0.0–0.1)
Basophils Relative: 1.1 % (ref 0.0–3.0)
Eosinophils Absolute: 0.5 10*3/uL (ref 0.0–0.7)
Eosinophils Relative: 8.3 % — ABNORMAL HIGH (ref 0.0–5.0)
HCT: 38.2 % (ref 36.0–46.0)
Hemoglobin: 13.4 g/dL (ref 12.0–15.0)
Lymphocytes Relative: 26.5 % (ref 12.0–46.0)
Lymphs Abs: 1.5 10*3/uL (ref 0.7–4.0)
MCHC: 35.2 g/dL (ref 30.0–36.0)
MCV: 92.4 fl (ref 78.0–100.0)
Monocytes Absolute: 0.8 10*3/uL (ref 0.1–1.0)
Monocytes Relative: 13.4 % — ABNORMAL HIGH (ref 3.0–12.0)
Neutro Abs: 2.9 10*3/uL (ref 1.4–7.7)
Neutrophils Relative %: 50.7 % (ref 43.0–77.0)
Platelets: 342 10*3/uL (ref 150.0–400.0)
RBC: 4.13 Mil/uL (ref 3.87–5.11)
RDW: 12.5 % (ref 11.5–15.5)
WBC: 5.7 10*3/uL (ref 4.0–10.5)

## 2019-05-01 LAB — LIPID PANEL
Cholesterol: 212 mg/dL — ABNORMAL HIGH (ref 0–200)
HDL: 54.8 mg/dL (ref >39.00)
LDL Cholesterol: 134 mg/dL — ABNORMAL HIGH (ref 0–99)
NonHDL: 157.66
Total CHOL/HDL Ratio: 4
Triglycerides: 120 mg/dL (ref 0.0–149.0)
VLDL: 24 mg/dL (ref 0.0–40.0)

## 2019-05-01 LAB — TSH: TSH: 0.54 u[IU]/mL (ref 0.35–4.50)

## 2019-05-01 LAB — HEMOGLOBIN A1C: Hgb A1c MFr Bld: 6.6 % — ABNORMAL HIGH (ref 4.6–6.5)

## 2019-05-01 MED ORDER — MELOXICAM 7.5 MG PO TABS: 7.5000 mg | ORAL_TABLET | Freq: Every day | ORAL | 1 refills | Status: DC

## 2019-05-01 MED ORDER — CLONAZEPAM 1 MG PO TABS: ORAL_TABLET | ORAL | 1 refills | Status: DC

## 2019-05-01 MED ORDER — BUPROPION HCL ER (SR) 150 MG PO TB12: 150.0000 mg | ORAL_TABLET | Freq: Every day | ORAL | 1 refills | Status: DC

## 2019-05-01 MED ORDER — LEVOTHYROXINE SODIUM 125 MCG PO TABS: 125.0000 ug | ORAL_TABLET | Freq: Every day | ORAL | 3 refills | Status: DC

## 2019-05-01 MED ORDER — LANTUS SOLOSTAR 100 UNIT/ML ~~LOC~~ SOPN: 8.0000 [IU] | PEN_INJECTOR | Freq: Every day | SUBCUTANEOUS | 3 refills | Status: DC

## 2019-05-01 MED ORDER — LOSARTAN POTASSIUM 100 MG PO TABS: 100.0000 mg | ORAL_TABLET | Freq: Every day | ORAL | 3 refills | Status: DC

## 2019-05-01 MED ORDER — CHLORTHALIDONE 25 MG PO TABS
12.5000 mg | ORAL_TABLET | ORAL | 3 refills | Status: DC
Start: 2019-05-01 — End: 2019-05-31

## 2019-05-01 NOTE — Progress Notes (Signed)
Subjective:    Patient ID: Jeanette Mcintosh, female    DOB: [birth year], 78 y.o.   MRN: [id number]  HPI 78 year old female who  has a past medical history of Acute kidney injury superimposed on chronic kidney disease ([place]) (11/09/2016), Arthritis, Asthma, Blood transfusion without reported diagnosis, Cancer ([place]), Cataract, COPD (chronic obstructive pulmonary disease) ([place]), Eczema, GERD (gastroesophageal reflux disease), Hyperlipidemia, Hypertension, IDDM (insulin dependent diabetes mellitus) ([place]), Irritable bowel syndrome, Macular degeneration, PAF (paroxysmal atrial fibrillation) ([place]) (2007), Paroxysmal atrial fibrillation ([place]) (11/09/2016), Paroxysmal atrial flutter ([place]), and Thyroid disease.  DM - insulin dependent. Currently prescribed Lantus sliding scale. She not report good compliance with insulin administration. She has been monitoring her blood sugars at home and reports readings in the 70's - 180's with her average being in the 130's.   Lab Results  Component Value Date   HGBA1C 6.7 (H) 03/08/2019   Hypothyroidism - Takes synthroid 125 mcg daily.  Lab Results  Component Value Date   TSH 0.189 (L) 11/10/2016   Chronic UTI's - she is managed by Urology ( Dr. Vikki Ports). She is on daily nitrofurantoin 100 mg daily. Denies recent UTI.   CKD III - is managed by Nephrology.   COPD - Uses Symbicort twice daily and rescue inhaler PRN. She feels controlled.   Insomnia - takes 1/2 to 3/4 tab QHS. She reports sleeping about 6 hours a night.   Hypertension/PAF - This is managed by cardiology. She is currently prescribed Metoprolol 37.5 mg daily, chlorthalidone 12.5 mg every other day and Losartan 100 mg QHS. She denies headaches, dizziness, lightheadedness, or syncopal episodes. She does monitor her blood pressure at home and per her log has readings between 99-180/60-90's. Her average BP is 130's/70's.    She has her mammogram and bone density screen scheduled for this month.  She  is up to date on routine eye exams   Review of Systems  Constitutional: Negative.   HENT: Negative.   Eyes: Negative.   Respiratory: Positive for shortness of breath (chronic ).   Cardiovascular: Negative.   Gastrointestinal: Positive for abdominal pain (chronic/intermittent from IBS) and diarrhea (chronic/intermittent from IBS).  Endocrine: Negative.   Genitourinary: Negative.   Musculoskeletal: Positive for arthralgias (bilateral knees).  Skin: Negative.   Allergic/Immunologic: Negative.   Neurological: Negative.   Hematological: Negative.   Psychiatric/Behavioral: Positive for sleep disturbance. The patient is nervous/anxious.    Past Medical History:  Diagnosis Date  . Acute kidney injury superimposed on chronic kidney disease ([place]) 11/09/2016  . Arthritis   . Asthma   . Blood transfusion without reported diagnosis   . Cancer (H[facility])    Cervical Cancer   . Cataract   . COPD (chronic obstructive pulmonary disease) ([place])   . Eczema   . GERD (gastroesophageal reflux disease)   . Hyperlipidemia   . Hypertension   . IDDM (insulin dependent diabetes mellitus) ([place])   . Irritable bowel syndrome   . Macular degeneration   . PAF (paroxysmal atrial fibrillation) ([place]) 2007  . Paroxysmal atrial fibrillation ([place]) 11/09/2016  . Paroxysmal atrial flutter ([place])   . Thyroid disease     Social History   Socioeconomic History  . Marital status: Widowed    Spouse name: Not on file  . Number of children: Not on file  . Years of education: 6  . Highest education level: Not on file  Occupational History  . Not on file  Social Needs  . Financial resource strain: Not on file  .  Food insecurity    Worry: Not on file    Inability: Not on file  . Transportation needs    Medical: Not on file    Non-medical: Not on file  Tobacco Use  . Smoking status: Former Smoker    Packs/day: 0.25    Years: 25.00    Pack years: 6.25    Types: Cigarettes    Start date: 09/14/1950    Quit date:  09/13/2013    Years since quitting: 5.6  . Smokeless tobacco: Never Used  . Tobacco comment: Pt has stopped and started smoking muliple times over the years.  Amount varied during the years.  Substance and Sexual Activity  . Alcohol use: Yes    Alcohol/week: 0.0 standard drinks    Comment: 1/Evening  . Drug use: No  . Sexual activity: Not on file  Lifestyle  . Physical activity    Days per week: Not on file    Minutes per session: Not on file  . Stress: Not on file  Relationships  . Social Herbalist on phone: Not on file    Gets together: Not on file    Attends religious service: Not on file    Active member of club or organization: Not on file    Attends meetings of clubs or organizations: Not on file    Relationship status: Not on file  . Intimate partner violence    Fear of current or ex partner: Not on file    Emotionally abused: Not on file    Physically abused: Not on file    Forced sexual activity: Not on file  Other Topics Concern  . Not on file  Social History Narrative  . Not on file    Past Surgical History:  Procedure Laterality Date  . ABDOMINAL HYSTERECTOMY  1971   Partial  . APPENDECTOMY  1971  . CATARACT EXTRACTION  2002  . CHOLECYSTECTOMY, LAPAROSCOPIC  2011  . HEMORRHOID SURGERY  1990's   x2  . renal doppler  12/12/06  . TOTAL HIP ARTHROPLASTY  2012   Left  . TOTAL HIP ARTHROPLASTY  2002   Right  . TRIGGER FINGER RELEASE  1990's   Several surgeries.  . US ECHOCARDIOGRAPHY  01/20/12    Family History  Problem Relation Age of Onset  . Basal cell carcinoma Father   . Dementia Father   . Parkinson's disease Father   . Cancer Other        All (5) had Hysterectomies and high dose Estrogen in 1950's were all diagnosed with Breast Cancer.  . Glaucoma Mother   . Allergic rhinitis Mother   . Parkinson's disease Paternal Grandmother   . Eczema Son     Allergies  Allergen Reactions  . Morphine And Related Nausea Only  . Phenergan  [Promethazine Hcl] Other (See Comments)    Restless leg  . Ambien [Zolpidem] Other (See Comments)    Unknown  . Dicyclomine Other (See Comments)    constipation Other reaction(s): GI Upset (intolerance) constipation  . Diphenhydramine Other (See Comments)    Restless leg Restless leg  . Erythromycin Nausea And Vomiting  . Ivp Dye [Iodinated Diagnostic Agents] Nausea And Vomiting  . Ondansetron Other (See Comments)    Unknown  . Propoxyphene Other (See Comments)    Unknown  . Shellfish Allergy Nausea And Vomiting  . Statins Other (See Comments)    Unknown  . Vitamin E Other (See Comments)    Hot flashes  Current Outpatient Medications on File Prior to Visit  Medication Sig Dispense Refill  . albuterol (PROAIR HFA) 108 (90 Base) MCG/ACT inhaler Inhale 2 puffs into the lungs every 4 (four) hours as needed for wheezing or shortness of breath. 1 Inhaler 3  . albuterol (PROVENTIL) (2.5 MG/3ML) 0.083% nebulizer solution Take 2.5 mg by nebulization every 6 (six) hours as needed for wheezing or shortness of breath.    Marland Kitchen b complex vitamins tablet Take 1 tablet by mouth daily.    . benzonatate (TESSALON) 100 MG capsule Take 100 mg by mouth as needed.   5  . buPROPion (WELLBUTRIN SR) 150 MG 12 hr tablet Take 150 mg by mouth daily. Reported on 02/13/2016    . cetirizine (ZYRTEC) 10 MG tablet Take 10 mg by mouth daily.    . chlorthalidone (HYGROTON) 25 MG tablet Take 0.5 mg by mouth every other day.     . Cholecalciferol (VITAMIN D PO) Take 2,000 Units by mouth daily.     . clonazePAM (KLONOPIN) 1 MG tablet Taking 1/2 to 3/4 tab at night before bed for anxiety and sleep    . FOLIC ACID PO Take 144 mcg by mouth daily.     . Garlic 8185 MG CAPS Take 1 capsule by mouth daily.    Marland Kitchen guaiFENesin (MUCINEX) 600 MG 12 hr tablet Take 1,200 mg by mouth 2 (two) times daily.    Marland Kitchen LANTUS SOLOSTAR 100 UNIT/ML injection Inject 8-20 Units into the skin at bedtime. Per BS    . latanoprost (XALATAN) 0.005 %  ophthalmic solution Place 1 drop into both eyes at bedtime.     Marland Kitchen levothyroxine (SYNTHROID) 125 MCG tablet Take 1 tablet (125 mcg total) by mouth daily before breakfast. 30 tablet 0  . losartan (COZAAR) 100 MG tablet Take 100 mg by mouth daily.     . meloxicam (MOBIC) 7.5 MG tablet Take by mouth.    . metoprolol tartrate (LOPRESSOR) 25 MG tablet TAKE 1 AND 1/2 TABLETS(37.5 MG) BY MOUTH TWICE DAILY 270 tablet 1  . NON FORMULARY Take 11 g by mouth daily. [place]    . OVER THE COUNTER MEDICATION BEET POWDER - 1100 MG  = 2 IN THE MORNING, 2 AT NIGHT AND 1 PRN FOR BLOOD PRESSURE    . PREBIOTIC PRODUCT PO Take by mouth. 16 Strain    . RUTIN PO Take 500 mg by mouth at bedtime.     . triamcinolone (NASACORT AQ) 55 MCG/ACT AERO nasal inhaler 1-2 sprays in each nostril daily 16.5 mL 3  . TURMERIC PO Take 1,500 mg by mouth daily. With Black Pepper and [place]wellia    . VITAMIN A PO Take 10,000 Units by mouth daily.     . vitamin C (ASCORBIC ACID) 250 MG tablet Take 250 mg by mouth daily.     No current facility-administered medications on file prior to visit.     BP 122/66   Temp 98 F (36.7 C)   Ht 5\' 3"  (1.6 m) Comment: With Shoes  Wt 142 lb (64.4 kg)   BMI 25.15 kg/m       Objective:   Physical Exam Vitals signs and nursing note reviewed.  Constitutional:      Appearance: Normal appearance.  HENT:     Head: Normocephalic and atraumatic.     Right Ear: Tympanic membrane, ear canal and external ear normal. There is no impacted cerumen.     Left Ear: Tympanic membrane, ear canal and external ear normal.  There is no impacted cerumen.     Nose: Nose normal. No congestion or rhinorrhea.     Mouth/Throat:     Mouth: Mucous membranes are moist.     Pharynx: Oropharynx is clear. No oropharyngeal exudate or posterior oropharyngeal erythema.  Eyes:     Pupils: Pupils are equal, round, and reactive to light.  Neck:     Musculoskeletal: Normal range of motion and neck supple. No neck rigidity.      Vascular: No carotid bruit.  Cardiovascular:     Rate and Rhythm: Normal rate and regular rhythm.     Pulses: Normal pulses.     Heart sounds: Normal heart sounds. No murmur. No friction rub. No gallop.   Pulmonary:     Effort: Pulmonary effort is normal. No respiratory distress.     Breath sounds: Normal breath sounds. No stridor. No wheezing, rhonchi or rales.  Chest:     Chest wall: No tenderness.  Abdominal:     General: Abdomen is flat. Bowel sounds are normal. There is no distension.     Palpations: Abdomen is soft. There is no mass.     Tenderness: There is no abdominal tenderness. There is no guarding or rebound.     Hernia: No hernia is present.  Musculoskeletal: Normal range of motion.        General: No swelling, tenderness, deformity or signs of injury.     Right lower leg: No edema.     Left lower leg: No edema.  Skin:    General: Skin is warm and dry.     Capillary Refill: Capillary refill takes less than 2 seconds.     Coloration: Skin is not jaundiced or pale.     Findings: No bruising, erythema, lesion or rash.  Neurological:     General: No focal deficit present.     Mental Status: She is alert and oriented to person, place, and time.  Psychiatric:        Mood and Affect: Mood normal.        Behavior: Behavior normal.        Thought Content: Thought content normal.        Judgment: Judgment normal.       Assessment & Plan:  1. Routine general medical examination at a health care facility - Follow up in one year or sooner if needed - CBC with Differential/Platelet - Comprehensive metabolic panel - Hemoglobin A1c - Lipid panel - TSH  2. Type 2 diabetes mellitus with diabetic nephropathy, with long-term current use of insulin (HCC) - Likely continue with sliding scale  - 6 month follow up.  - CBC with Differential/Platelet - Comprehensive metabolic panel - Hemoglobin A1c - Lipid panel - TSH  3. Essential hypertension - No change; well  controlled.  - CBC with Differential/Platelet - Comprehensive metabolic panel - Hemoglobin A1c - Lipid panel - TSH - chlorthalidone (HYGROTON) 25 MG tablet; Take 0.5 tablets (12.5 mg total) by mouth every other day.  Dispense: 23 tablet; Refill: 3 - losartan (COZAAR) 100 MG tablet; Take 1 tablet (100 mg total) by mouth daily.  Dispense: 90 tablet; Refill: 3  4. Mixed hyperlipidemia - Consider statin - patient has been reluctant to restart in the past  - CBC with Differential/Platelet - Comprehensive metabolic panel - Hemoglobin A1c - Lipid panel - TSH  5. Thyroid disease - Consider increase in synthroid  - CBC with Differential/Platelet - Comprehensive metabolic panel - Hemoglobin A1c - Lipid panel - TSH  6. Chronic obstructive pulmonary disease, unspecified COPD type ([place]) - Continue with inhalers as directed   7. CKD (chronic kidney disease) stage 3, GFR 30-59 ml/min (HCC)  - CBC with Differential/Platelet - Comprehensive metabolic panel - Hemoglobin A1c - Lipid panel - TSH  8. Encounter for smoking cessation counseling  - buPROPion (WELLBUTRIN SR) 150 MG 12 hr tablet; Take 1 tablet (150 mg total) by mouth daily. Reported on 02/13/2016  Dispense: 30 tablet; Refill: 1  9. Primary insomnia  - clonazePAM (KLONOPIN) 1 MG tablet; Taking 1/2 to 3/4 tab at night before bed for anxiety and sleep  Dispense: 30 tablet; Refill: 1  10. Primary osteoarthritis involving multiple joints  - meloxicam (MOBIC) 7.5 MG tablet; Take 1 tablet (7.5 mg total) by mouth daily.  Dispense: 90 tablet; Refill: 1  Dorothyann Peng, NP

## 2019-05-07 ENCOUNTER — Encounter: Payer: Self-pay | Admitting: Adult Health

## 2019-05-08 MED ORDER — ONETOUCH VERIO VI STRP: ORAL_STRIP | 3 refills | Status: DC

## 2019-05-08 NOTE — Telephone Encounter (Signed)
Sent to the pharmacy by e-scribe.  Pt notified.

## 2019-05-09 DIAGNOSIS — Z1231 Encounter for screening mammogram for malignant neoplasm of breast: Secondary | ICD-10-CM | POA: Diagnosis not present

## 2019-05-09 LAB — HM MAMMOGRAPHY

## 2019-05-25 ENCOUNTER — Encounter: Payer: Self-pay | Admitting: Adult Health

## 2019-05-30 ENCOUNTER — Encounter: Payer: Self-pay | Admitting: Adult Health

## 2019-05-31 ENCOUNTER — Encounter: Payer: Self-pay | Admitting: Adult Health

## 2019-05-31 DIAGNOSIS — I10 Essential (primary) hypertension: Secondary | ICD-10-CM

## 2019-05-31 MED ORDER — CHLORTHALIDONE 25 MG PO TABS: 12.5000 mg | ORAL_TABLET | Freq: Every day | ORAL | 3 refills | Status: DC

## 2019-05-31 NOTE — Telephone Encounter (Signed)
Please advise. Ok to change Rx? °

## 2019-06-07 ENCOUNTER — Ambulatory Visit: Payer: PPO | Admitting: Cardiology

## 2019-06-13 ENCOUNTER — Other Ambulatory Visit: Payer: Self-pay

## 2019-06-14 ENCOUNTER — Encounter: Payer: Self-pay | Admitting: Adult Health

## 2019-06-15 ENCOUNTER — Ambulatory Visit (INDEPENDENT_AMBULATORY_CARE_PROVIDER_SITE_OTHER): Payer: Self-pay | Admitting: Cardiology

## 2019-06-15 ENCOUNTER — Other Ambulatory Visit: Payer: Self-pay

## 2019-06-15 ENCOUNTER — Encounter: Payer: Self-pay | Admitting: Cardiology

## 2019-06-15 VITALS — BP 99/51 | HR 66 | Temp 96.9°F | Ht 62.0 in | Wt 144.0 lb

## 2019-06-15 DIAGNOSIS — I4892 Unspecified atrial flutter: Secondary | ICD-10-CM

## 2019-06-15 DIAGNOSIS — I10 Essential (primary) hypertension: Secondary | ICD-10-CM

## 2019-06-15 DIAGNOSIS — E7800 Pure hypercholesterolemia, unspecified: Secondary | ICD-10-CM

## 2019-06-15 MED ORDER — HYDROCHLOROTHIAZIDE 12.5 MG PO TABS: 12.5000 mg | ORAL_TABLET | Freq: Every day | ORAL | 3 refills | Status: DC

## 2019-06-15 NOTE — Progress Notes (Signed)
Primary Physician/Referring:  Dorothyann Peng, NP  Patient ID: Jeanette Mcintosh, female    DOB: [birth year], 78 y.o.   MRN: [id number]  Chief Complaint  Patient presents with  . Follow-up  . Atrial Flutter   HPI:    Esa Cadd  is a 78 y.o. Hypertension, hyperlipidemia,  controlled diabetes mellitus on insulin, history of atrial fibrillation, In 2018 she presented to the emergency room with flu and typical atrial flutter by EKG but was erroneously diagnosed as having atrial fibrillation, spontaneously converted to sinus rhythm, did not want to be on anti-coagulation.   In 2014 or 2013, she was told to have atrial fibrillation that no documented episodes in spite of extensive research by me at that time.  She is tolerating the present medications without any side effects. In spite of diabetes and vascular risk factors, she is reluctant to taking statins.   She requests that I changed chlorthalidone as she is having difficulty to break the medication in half.  She continues to complain of tingling and sometimes burning sensation in her legs especially when she touches the veins.  She has not had any palpitations or chest pain.  Past Medical History:  Diagnosis Date  . Acute kidney injury superimposed on chronic kidney disease ([place]) 11/09/2016  . Arthritis   . Asthma   . Blood transfusion without reported diagnosis   . Cancer (HCC)    Cervical Cancer   . Cataract   . COPD (chronic obstructive pulmonary disease) ([place])   . Eczema   . GERD (gastroesophageal reflux disease)   . Hyperlipidemia   . Hypertension   . IDDM (insulin dependent diabetes mellitus)   . Irritable bowel syndrome   . Macular degeneration   . PAF (paroxysmal atrial fibrillation) ([place]) 2007  . Paroxysmal atrial fibrillation ([place]) 11/09/2016  . Paroxysmal atrial flutter ([place])   . Thyroid disease    Past Surgical History:  Procedure Laterality Date  . ABDOMINAL HYSTERECTOMY  1971   Partial  .  APPENDECTOMY  1971  . CATARACT EXTRACTION  2002  . CHOLECYSTECTOMY, LAPAROSCOPIC  2011  . HEMORRHOID SURGERY  1990's   x2  . renal doppler  12/12/06  . TOTAL HIP ARTHROPLASTY  2012   Left  . TOTAL HIP ARTHROPLASTY  2002   Right  . TRIGGER FINGER RELEASE  1990's   Several surgeries.  . US ECHOCARDIOGRAPHY  01/20/12   Social History   Socioeconomic History  . Marital status: Divorced    Spouse name: Not on file  . Number of children: 6  . Years of education: Not on file  . Highest education level: Not on file  Occupational History  . Not on file  Social Needs  . Financial resource strain: Not on file  . Food insecurity    Worry: Not on file    Inability: Not on file  . Transportation needs    Medical: Not on file    Non-medical: Not on file  Tobacco Use  . Smoking status: Former Smoker    Packs/day: 0.25    Years: 25.00    Pack years: 6.25    Types: Cigarettes    Start date: 09/14/1950    Quit date: 09/13/2013    Years since quitting: 5.7  . Smokeless tobacco: Never Used  . Tobacco comment: Pt has stopped and started smoking muliple times over the years.  Amount varied during the years.  Substance and Sexual Activity  . Alcohol use: Yes  Alcohol/week: 0.0 standard drinks    Comment: 1/Evening  . Drug use: No  . Sexual activity: Not on file  Lifestyle  . Physical activity    Days per week: Not on file    Minutes per session: Not on file  . Stress: Not on file  Relationships  . Social Herbalist on phone: Not on file    Gets together: Not on file    Attends religious service: Not on file    Active member of club or organization: Not on file    Attends meetings of clubs or organizations: Not on file    Relationship status: Not on file  . Intimate partner violence    Fear of current or ex partner: Not on file    Emotionally abused: Not on file    Physically abused: Not on file    Forced sexual activity: Not on file  Other Topics Concern  . Not on  file  Social History Narrative  . Not on file   ROS  Review of Systems  Constitution: Negative for chills, decreased appetite, malaise/fatigue and weight gain.  Cardiovascular: Negative for dyspnea on exertion, leg swelling and syncope.  Endocrine: Negative for cold intolerance.  Hematologic/Lymphatic: Does not bruise/bleed easily.  Musculoskeletal: Positive for joint pain (hands). Negative for joint swelling.  Gastrointestinal: Negative for abdominal pain, anorexia, change in bowel habit, hematochezia and melena.  Neurological: Positive for paresthesias (both legs). Negative for headaches and light-headedness.  Psychiatric/Behavioral: Negative for depression and substance abuse.  All other systems reviewed and are negative.  Objective   Vitals with BMI 06/15/2019 05/01/2019 03/08/2019  Height 5\' 2"  5\' 3"  -  Weight 144 lbs 142 lbs 147 lbs  BMI [id number] A999333 -  Systolic 99 [id number] [id number]  Diastolic 51 66 64  Pulse 66 - -    Blood pressure (!) 99/51, pulse 66, temperature (!) 96.9 F (36.1 C), height 5\' 2"  (1.575 m), weight 144 lb (65.3 kg), SpO2 97 %. Body mass index is 26.34 kg/m.   Physical Exam  Constitutional:  Moderately built and petite in no acute distress  HENT:  Head: Atraumatic.  Eyes: Conjunctivae are normal.  Neck: Neck supple. No JVD present. No thyromegaly present.  Cardiovascular: Normal rate, regular rhythm, normal heart sounds and intact distal pulses. Exam reveals no gallop.  No murmur heard. Pulmonary/Chest: Effort normal and breath sounds normal.  Abdominal: Soft. Bowel sounds are normal.  Musculoskeletal: Normal range of motion.  Neurological: She is alert.  Skin: Skin is warm and dry.  Psychiatric: She has a normal mood and affect.   Radiology: No results found.  Laboratory examination:   Recent Labs    03/08/19 1214 05/01/19 1044  NA 131* 126*  K 4.9 3.6  CL 94* 88*  CO2 31 30  GLUCOSE 115* 87  BUN 19 16  CREATININE 0.95 0.90  CALCIUM 9.4 9.4    CMP Latest Ref Rng & Units 05/01/2019 03/08/2019 11/11/2016  Glucose 70 - 99 mg/dL 87 115(H) 85  BUN 6 - 23 mg/dL 16 19 10   Creatinine 0.40 - 1.20 mg/dL 0.90 0.95 1.03(H)  Sodium 135 - 145 mEq/L 126(L) 131(L) 136  Potassium 3.5 - 5.1 mEq/L 3.6 4.9 3.4(L)  Chloride 96 - 112 mEq/L 88(L) 94(L) 105  CO2 19 - 32 mEq/L 30 31 23   Calcium 8.4 - 10.5 mg/dL 9.4 9.4 8.1(L)  Total Protein 6.0 - 8.3 g/dL 6.8 - 5.2(L)  Total Bilirubin 0.2 - 1.2 mg/dL 0.9 -  0.7  Alkaline Phos 39 - 117 U/L 77 - 58  AST 0 - 37 U/L 18 - 26  ALT 0 - 35 U/L 13 - 16   CBC Latest Ref Rng & Units 05/01/2019 11/11/2016 11/10/2016  WBC 4.0 - 10.5 K/uL 5.7 3.9(L) 3.5(L)  Hemoglobin 12.0 - 15.0 g/dL 13.4 12.1 13.4  Hematocrit 36.0 - 46.0 % 38.2 35.2(L) 37.2  Platelets 150.0 - 400.0 K/uL 342.0 179 206   Lipid Panel     Component Value Date/Time   CHOL 212 (H) 05/01/2019 1044   TRIG 120.0 05/01/2019 1044   HDL 54.80 05/01/2019 1044   CHOLHDL 4 05/01/2019 1044   VLDL 24.0 05/01/2019 1044   LDLCALC 134 (H) 05/01/2019 1044   HEMOGLOBIN A1C Lab Results  Component Value Date   HGBA1C 6.6 (H) 05/01/2019   MPG 134 11/10/2016   TSH Recent Labs    05/01/19 1044  TSH 0.54   Medications and allergies   Allergies  Allergen Reactions  . Morphine And Related Nausea Only  . Phenergan [Promethazine Hcl] Other (See Comments)    Restless leg  . Ambien [Zolpidem] Other (See Comments)    Unknown  . Dicyclomine Other (See Comments)    constipation Other reaction(s): GI Upset (intolerance) constipation  . Diphenhydramine Other (See Comments)    Restless leg Restless leg  . Erythromycin Nausea And Vomiting  . Ivp Dye [Iodinated Diagnostic Agents] Nausea And Vomiting  . Ondansetron Other (See Comments)    Unknown  . Propoxyphene Other (See Comments)    Unknown  . Shellfish Allergy Nausea And Vomiting  . Statins Other (See Comments)    Unknown  . Vitamin E Other (See Comments)    Hot flashes     Prior to Admission  medications   Medication Sig Start Date End Date Taking? Authorizing Provider  albuterol (PROAIR HFA) 108 (90 Base) MCG/ACT inhaler Inhale 2 puffs into the lungs every 4 (four) hours as needed for wheezing or shortness of breath. 03/18/16  Yes Gean Quint, MD  albuterol (PROVENTIL) (2.5 MG/3ML) 0.083% nebulizer solution Take 2.5 mg by nebulization every 6 (six) hours as needed for wheezing or shortness of breath.   Yes [provider]  b complex vitamins tablet Take 1 tablet by mouth daily.   Yes [provider]  benzonatate (TESSALON) 100 MG capsule Take 100 mg by mouth as needed.  07/21/15  Yes [provider]  cetirizine (ZYRTEC) 10 MG tablet Take 10 mg by mouth daily.   Yes [provider]  chlorthalidone (HYGROTON) 25 MG tablet Take 0.5 tablets (12.5 mg total) by mouth daily. 05/31/19 08/29/19 Yes Nafziger, Tommi Rumps, NP  Cholecalciferol (VITAMIN D PO) Take 4,000 Units by mouth daily.    Yes [provider]  clonazePAM (KLONOPIN) 1 MG tablet Taking 1/2 to 3/4 tab at night before bed for anxiety and sleep 05/01/19  Yes Nafziger, Tommi Rumps, NP  FOLIC ACID PO Take A999333 mcg by mouth daily.    Yes [provider]  glucose blood (ONETOUCH VERIO) test strip Use to test blood glucose three times daily. 05/08/19  Yes Nafziger, Tommi Rumps, NP  guaiFENesin (MUCINEX) 600 MG 12 hr tablet Take 1,200 mg by mouth 2 (two) times daily.   Yes [provider]  Insulin Glargine (LANTUS SOLOSTAR) 100 UNIT/ML Solostar Pen Inject 8-20 Units into the skin at bedtime. Per BS 05/01/19  Yes Nafziger, Tommi Rumps, NP  latanoprost (XALATAN) 0.005 % ophthalmic solution Place 1 drop into both eyes at bedtime.  10/13/12  Yes [provider]  levothyroxine (SYNTHROID) 125 MCG tablet Take 1 tablet (125 mcg total) by mouth daily before breakfast. 05/01/19  Yes Nafziger, Tommi Rumps, NP  losartan (COZAAR) 100 MG tablet Take 1 tablet (100 mg total) by mouth daily. 05/01/19  Yes Nafziger, Tommi Rumps, NP   meloxicam (MOBIC) 7.5 MG tablet Take 1 tablet (7.5 mg total) by mouth daily. 05/01/19  Yes Nafziger, Tommi Rumps, NP  metoprolol tartrate (LOPRESSOR) 25 MG tablet TAKE 1 AND 1/2 TABLETS(37.5 MG) BY MOUTH TWICE DAILY 11/13/18  Yes Adrian Prows, MD  NON FORMULARY Take 11 g by mouth daily. Boswellia   Yes [provider]  OVER THE COUNTER MEDICATION BEET POWDER - 1100 MG  = 2 IN THE MORNING, 2 AT NIGHT AND 1 PRN FOR BLOOD PRESSURE   Yes [provider]  PREBIOTIC PRODUCT PO Take by mouth. 45 Strain   Yes [provider]  RUTIN PO Take 500 mg by mouth at bedtime.    Yes [provider]  TURMERIC PO Take 1,500 mg by mouth daily. With Black Pepper and Boswellia   Yes [provider]  VITAMIN A PO Take 10,000 Units by mouth daily.    Yes [provider]  vitamin C (ASCORBIC ACID) 250 MG tablet Take 250 mg by mouth daily.   Yes [provider]     Current Outpatient Medications  Medication Instructions  . albuterol (PROAIR HFA) 108 (90 Base) MCG/ACT inhaler 2 puffs, Inhalation, Every 4 hours PRN  . albuterol (PROVENTIL) 2.5 mg, Nebulization, Every 6 hours PRN  . b complex vitamins tablet 1 tablet, Oral, Daily  . benzonatate (TESSALON) 100 mg, Oral, As needed  . cetirizine (ZYRTEC) 10 mg, Oral, Daily  . chlorthalidone (HYGROTON) 12.5 mg, Oral, Daily  . Cholecalciferol (VITAMIN D PO) 4,000 Units, Oral, Daily  . clonazePAM (KLONOPIN) 1 MG tablet Taking 1/2 to 3/4 tab at night before bed for anxiety and sleep  . FOLIC ACID PO A999333 mcg, Oral, Daily  . glucose blood (ONETOUCH VERIO) test strip Use to test blood glucose three times daily.  Marland Kitchen guaiFENesin (MUCINEX) 1,200 mg, Oral, 2 times daily  . Lantus SoloStar 8-20 Units, Subcutaneous, Daily at bedtime, Per BS  . latanoprost (XALATAN) 0.005 % ophthalmic solution 1 drop, Both Eyes, Daily at bedtime  . levothyroxine (SYNTHROID) 125 mcg, Oral, Daily before breakfast  . losartan (COZAAR) 100 mg, Oral, Daily   . meloxicam (MOBIC) 7.5 mg, Oral, Daily  . metoprolol tartrate (LOPRESSOR) 25 MG tablet TAKE 1 AND 1/2 TABLETS(37.5 MG) BY MOUTH TWICE DAILY  . NON FORMULARY 11 g, Oral, Daily, Boswellia  . OVER THE COUNTER MEDICATION BEET POWDER - 1100 MG  = 2 IN THE MORNING, 2 AT NIGHT AND 1 PRN FOR BLOOD PRESSURE   . PREBIOTIC PRODUCT PO Oral, 16 Strain   . RUTIN PO 500 mg, Oral, Daily at bedtime  . TURMERIC PO 1,500 mg, Oral, Daily, With Black Pepper and Boswellia  . VITAMIN A PO 10,000 Units, Oral, Daily  . vitamin C (ASCORBIC ACID) 250 mg, Oral, Daily    Cardiac Studies:   Carotid artery duplex 05/11/2017: No significant bilateral external or common carotid artery disease.  Echocardiogram 11/10/2016: Normal LV systolic function, he is 123456.  Normal atrial size.  Mild TR, mild pulmonary hypertension, PA pressure 35 mmHg.  Assessment     ICD-10-CM   1. Paroxysmal atrial flutter (HCC) - Typical  I48.92 EKG 12-Lead   CHA2DS2-VASc Score is 5.  Yearly  risk of stroke:6.2% (Female age >34, HTN, DM).  2. Essential hypertension  I10   3. Hypercholesteremia  E78.00     EKG 06/15/2019: Normal sinus rhythm with rate of 64 bpm, normal axis, no evidence of ischemia, normal EKG. No significant change from  EKG 11/13/2018   EKG 11/09/2016: Atrial flutter with 2:1 conduction with RVR, ventricle rate 148 bpm.  Recommendations:   She has a diagnosis of atrial fibrillation sometime in 2013, I do not have EKG tracings. She did present in February 2018 with  influenza at that time EKG revealed typical atrial flutter with 2:1 conduction. She does not want to be on anticoagulation.  She essentially is asymptomatic except for chronic dyspnea. Diabetes continues to be controlled she does have peripheral neuropathy, lipids although elevated, patient is reluctant to taking statins due to severe myalgia.   She is having difficulty breaking chlorthalidone in half, hence I switched her to hydrochlorothiazide 12.5 mg  tablets.  From cardiac standpoint she remained stable, I'll see her back in 6 months. If she indeed he were to develop palpitations and recurrence of atrial flutter, she may be a good candidate for ablation and not be on long-term anticoagulation.  I also reviewed her labs from the PCP.  Adrian Prows, MD, [facility] 06/15/2019, 1:28 PM [facility]. [place] Pager: [phone] Office: [phone] If no answer Cell [phone]

## 2019-06-19 ENCOUNTER — Other Ambulatory Visit: Payer: Self-pay

## 2019-06-19 DIAGNOSIS — I480 Paroxysmal atrial fibrillation: Secondary | ICD-10-CM

## 2019-06-19 DIAGNOSIS — I10 Essential (primary) hypertension: Secondary | ICD-10-CM

## 2019-06-19 MED ORDER — METOPROLOL TARTRATE 25 MG PO TABS: ORAL_TABLET | ORAL | 1 refills | Status: DC

## 2019-06-21 ENCOUNTER — Encounter: Payer: Self-pay | Admitting: Family Medicine

## 2019-06-21 ENCOUNTER — Encounter: Payer: Self-pay | Admitting: Adult Health

## 2019-06-22 MED ORDER — INSULIN SYRINGE/NEEDLE U-500 31G X 6MM 0.5 ML MISC: 1.0000 | Freq: Every day | 3 refills | Status: DC

## 2019-06-22 NOTE — Telephone Encounter (Signed)
Sent to the pharmacy by e-scribe. 

## 2019-06-26 ENCOUNTER — Encounter: Payer: Self-pay | Admitting: Family Medicine

## 2019-06-26 DIAGNOSIS — N3020 Other chronic cystitis without hematuria: Secondary | ICD-10-CM | POA: Diagnosis not present

## 2019-06-26 DIAGNOSIS — R351 Nocturia: Secondary | ICD-10-CM | POA: Diagnosis not present

## 2019-07-04 ENCOUNTER — Encounter: Payer: Self-pay | Admitting: Adult Health

## 2019-07-10 DIAGNOSIS — N3020 Other chronic cystitis without hematuria: Secondary | ICD-10-CM | POA: Diagnosis not present

## 2019-07-19 ENCOUNTER — Other Ambulatory Visit: Payer: Self-pay | Admitting: *Deleted

## 2019-07-19 DIAGNOSIS — N3020 Other chronic cystitis without hematuria: Secondary | ICD-10-CM | POA: Diagnosis not present

## 2019-07-19 DIAGNOSIS — R351 Nocturia: Secondary | ICD-10-CM | POA: Diagnosis not present

## 2019-07-19 NOTE — Patient Outreach (Signed)
  [facility]) Care Management Chronic Special Needs Program    07/19/2019  Name: Jeanette Mcintosh, DOB: 12-08-1940  MRN: [id number]   Ms. Jeanette "[name]"  Mcintosh is enrolled in a chronic special needs plan for Diabetes.  Attempted to reach [name] via mobile number to complete initial assessment; no answer; left HIPAA compliant message requesting return call.  Plan: If client does not return call, will make second outreach call within one week.  Kelli Churn RN, CCM, [facility] [phone]

## 2019-07-23 ENCOUNTER — Other Ambulatory Visit: Payer: Self-pay | Admitting: *Deleted

## 2019-07-23 ENCOUNTER — Ambulatory Visit: Payer: Self-pay | Admitting: *Deleted

## 2019-07-23 DIAGNOSIS — H401131 Primary open-angle glaucoma, bilateral, mild stage: Secondary | ICD-10-CM | POA: Diagnosis not present

## 2019-07-23 DIAGNOSIS — H524 Presbyopia: Secondary | ICD-10-CM | POA: Diagnosis not present

## 2019-07-23 LAB — HM DIABETES EYE EXAM

## 2019-07-23 NOTE — Patient Outreach (Signed)
  [place] [facility]) Care Management Chronic Special Needs Program    07/23/2019  Name: Jeanette Mcintosh, DOB: 1941-02-03  MRN: [id number]   [name] is enrolled in a chronic special needs plan for Diabetes.  Attempted to reach [name] via mobile/home number to complete initial assessment; no answer; left HIPAA compliant message requesting return call.  Plan: If client does not return call, will make third outreach call within one week.  Kelli Churn RN, CCM, [place] [facility] [phone]

## 2019-07-24 ENCOUNTER — Ambulatory Visit: Payer: Self-pay | Admitting: *Deleted

## 2019-07-24 ENCOUNTER — Other Ambulatory Visit: Payer: Self-pay | Admitting: *Deleted

## 2019-07-24 NOTE — Patient Outreach (Addendum)
  [place] [facility]) Care Management Chronic Special Needs Program    07/24/2019  Name: Ashlyne Ridpath, DOB: 06/22/1941  MRN: [id number]   Ms. Deia Matchett is enrolled in a chronic special needs plan for Diabetes.  Reached Mrs. Etzkorn via mobile number, 2 HIPAA identifiers verified, explained purpose of call. Mrs. Almendariz states she has enrolled in a [employer] plan effective 08/14/19. She also says she will need to cancel the [facility] home visit scheduled for December 22 , 2020.   Plan: Advised Mrs. Blunck this RNCM will contact [facility] to cancel her December home visit appointment. Also informed Mrs. Mccarten this RNCM will consult [facility] CM leadership regarding the need to complete an initial assessment and develop her individualize care plan since she has been on the [facility] since 05/15/19.  Kelli Churn RN, CCM, [place] [facility] [phone]

## 2019-07-25 ENCOUNTER — Encounter: Payer: Self-pay | Admitting: *Deleted

## 2019-07-25 ENCOUNTER — Other Ambulatory Visit: Payer: Self-pay | Admitting: *Deleted

## 2019-07-25 NOTE — Patient Outreach (Addendum)
[place] [facility]) Care Management Chronic Special Needs Program  07/25/2019  Name: Jeanette Mcintosh DOB: 1940/11/20  MRN: [id number]  Ms. Sakora Conca is enrolled in a chronic special needs plan for Diabetes. Chronic Care Management Coordinator telephoned client to review health risk assessment and to develop individualized care plan.  Introduced the chronic care management program, importance of client participation, and taking their care plan to all provider appointments and inpatient facilities.  Reviewed the transition of care process and possible referral to community care management.  Subjective: Mrs. Escandon states she has managed her diabetes well since her initial diagnosis of prediabetes in 1993. She states she takes her medications as prescribed, follows a CHO controlled meal plan and wears a Bluetooth exercise bracelet to monitor her daily steps.  She says she checks her blood sugar 2-3 times daily and is interested in learning more about the [employer] flash glucose monitoring system. She says she self adjusts her Lantus insulin based on her food consumption and activity. She defines her hypoglycemic threshold as a blood sugar of 70.  She says she does fall occasionally and attributes her falls to a leg that is shorter than the other after she had hip surgery , uneven floor thresholds in her home and generalized muscle weakness from lack of consistent exercise ( due to the Covid pandemic .  She says her  most recent fall was due to tripping over monkey grass in her yard because her eyes had been dilated earlier in the day and it was also dark outside. She does not want to attend physical therapy sessions during the current Covid pandemic.   She says she does experience problems with neuropathy and muscle cramping and would appreciate any educational information on neuropathy. She wants to try to avoid taking medication to treat the neuropathy.  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 She says she is divorced and her ex husband is dead. She has 6 children, some of which are estranged and live out of state. She says she has a supportive grandson that lives nearby and assists her as needed with groceries, etc.   Goals Addressed            This Visit's Progress     Patient Stated   . "Do you have any information on generalized neuropathy?" (pt-stated)       Discussed peripheral and diabetic neuropathy and mailed Emmi education on neuropathy and nerve damage caused by diabetes to client's home address    . "I'd like to know more about the personal [employer] flash glucose monitoring system" (pt-stated)       Discussed the professional and personal [employer] Flash Glucose Monitoring Systems , Medicare qualifying criteria and mailed information on the personal system to her home address      Other   . Client understands the importance of follow-up with providers by attending scheduled visits   On track    Client states she is adherent with provider appointments  She has a no show rate of 0% per chart review    . Client will report no fall or injuries in the next 9-12 months.       Reviewed reasons for falls and discussed strategies to prevent falls Mailed client Emmi education related to balance exercises and Check For Safety Brochure    . Client will verbalize knowledge of chronic lung disease as evidenced by no ED visits or Inpatient stays related to chronic lung disease  Assessed current state of lung disease and medication taking behavior    . Client will verbalize knowledge of self management of Hypertension as evidences by BP reading of 140/90 or less; or as defined by provider       Assessed recent blood pressure readings and medication taking behavior All documented blood pressure readings are meeting target Mailed Emmi education on Diabetes and Blood Pressure    . HEMOGLOBIN A1C < 7.0       Diabetes self management actions  discussed:  Glucose monitoring per provider recommendations  Eat Healthy  Check feet daily  Visit provider every 3-6 months as directed  Hbg A1C level every 3-6 months.  Eye Exam yearly    . Maintain timely refills of diabetic medication as prescribed within the year .   On track    Assessed medication taking behavior and reviewed dispense report for glargine insulin    . Obtain annual  Lipid Profile, LDL-C   On track    Reviewed results of most recent lipid profile done 05/01/19 and offered to mail education on a heart healthy diet to client    . Obtain Annual Eye (retinal)  Exam    On track    Assessed frequency of eye exams, client's most recent dilated eye exam was 07/23/19    . Obtain Annual Foot Exam   On track    Diabetic foot exam was completed 05/01/19    . Obtain annual screen for micro albuminuria (urine) , nephropathy (kidney problems)   Not on track    Most recent urine for protein result was completed on 04/05/28    . Obtain Hemoglobin A1C at least 2 times per year   On track    Most recent Hgb A1C result was 6.6% on 05/01/19 Review of medical record indicates Hgb A1C consistently meets target of <7.0%     . Visit Primary Care Provider or Endocrinologist at least 2 times per year    On track    Client is adherent with provider appointments and has seen her primary care provider >2 times this year      Assessment: Client is meeting diabetes self-management goal of hemoglobin A1C of <7.0% with most recent reading of 6.6% on 05/01/19 without reports of hypoglycemia . Client has good understanding of:  COVID-19 cause, symptoms, precautions (social distancing, stay at home order, hand washing, wear face covering when unable to maintain or ensure 6 foot social distancing), and symptoms requiring provider notification.  Plan:   Send successful outreach letter with a copy of their individualized care plan  Send individual care plan to provider  Send educational material  on diabetes and blood pressure, balance exercises,  home safety and diabetes and nerve damage.  Chronic care management coordination will outreach in: 6 months if she decides to keep current insurance plan.   Kelli Churn RN, CCM, [facility] [phone]

## 2019-07-26 ENCOUNTER — Ambulatory Visit: Payer: Self-pay | Admitting: *Deleted

## 2019-07-30 ENCOUNTER — Telehealth: Payer: Self-pay | Admitting: *Deleted

## 2019-07-30 NOTE — Telephone Encounter (Signed)
Copied from [place] [id number]. Topic: General - Other >> Jul 30, 2019  2:45 PM Rainey Pines A wrote: Patient would like a callback from nurse  today in regards to solastar pen being prescribed for her. Patient stated that she does not understand why she was prescribed this when she only uses syringes. Please advise. >> Jul 30, 2019  2:50 PM Rainey Pines A wrote: Patient also wants to know if she can be seen by anyone as soon as possible in regards to her medication.  Spoke with patient. Per patient she needs syringes for her Insulin. The BD ultra fine needles, also needs a refill on her Symbicort, and would like a f/u appt preferrably not 7 or 7:30 am due to her IBS

## 2019-07-31 MED ORDER — INSULIN GLARGINE 100 UNIT/ML ~~LOC~~ SOLN: SUBCUTANEOUS | 0 refills | Status: DC

## 2019-07-31 MED ORDER — BUDESONIDE-FORMOTEROL FUMARATE 160-4.5 MCG/ACT IN AERO: 2.0000 | INHALATION_SPRAY | Freq: Two times a day (BID) | RESPIRATORY_TRACT | 2 refills | Status: DC

## 2019-07-31 MED ORDER — "BD INSULIN SYRINGE ULTRAFINE 31G X 15/64"" 0.3 ML MISC": 3 refills | Status: DC

## 2019-07-31 NOTE — Telephone Encounter (Addendum)
Symbicort, Lantus (vial) and insulin syringes sent to the pharmacy by e-scribe.

## 2019-07-31 NOTE — Telephone Encounter (Signed)
Tried to call the pharmacy.  Was advised the pharmacy is closed until 2 PM for lunch.  Will try again at a later time.

## 2019-07-31 NOTE — Addendum Note (Signed)
Addended by: Miles Costain T on: 07/31/2019 02:32 PM   Modules accepted: Orders

## 2019-08-01 ENCOUNTER — Encounter: Payer: Self-pay | Admitting: Adult Health

## 2019-08-13 ENCOUNTER — Telehealth: Payer: Self-pay | Admitting: Adult Health

## 2019-08-13 ENCOUNTER — Encounter: Payer: Self-pay | Admitting: Adult Health

## 2019-08-13 DIAGNOSIS — F5101 Primary insomnia: Secondary | ICD-10-CM

## 2019-08-13 NOTE — Telephone Encounter (Signed)
Medication Refill - Medication: clonazePAM (KLONOPIN) 1 MG tablet    Has the patient contacted their pharmacy? Yes.   (Agent: If no, request that the patient contact the pharmacy for the refill.) (Agent: If yes, when and what did the pharmacy advise?)  Preferred Pharmacy (with phone number or street name):  [place] [facility] [place] [facility] [address]  [place] [address]  Phone: [phone] Fax: [phone]  Not a 24 hour pharmacy; exact hours not known.     Agent: Please be advised that RX refills may take up to 3 business days. We ask that you follow-up with your pharmacy.

## 2019-08-14 MED ORDER — CLONAZEPAM 1 MG PO TABS: ORAL_TABLET | ORAL | 1 refills | Status: DC

## 2019-08-15 ENCOUNTER — Encounter: Payer: Self-pay | Admitting: Family Medicine

## 2019-08-21 ENCOUNTER — Encounter: Payer: Self-pay | Admitting: Adult Health

## 2019-08-21 MED ORDER — ONETOUCH VERIO FLEX SYSTEM W/DEVICE KIT: 1.0000 | PACK | Freq: Once | 0 refills | Status: AC

## 2019-08-24 DIAGNOSIS — N3941 Urge incontinence: Secondary | ICD-10-CM | POA: Diagnosis not present

## 2019-08-24 DIAGNOSIS — R350 Frequency of micturition: Secondary | ICD-10-CM | POA: Diagnosis not present

## 2019-09-21 ENCOUNTER — Other Ambulatory Visit: Payer: Self-pay | Admitting: *Deleted

## 2019-09-21 NOTE — Patient Outreach (Addendum)
  [facility]) Care Management Chronic Special Needs Program    09/21/2019  Name: Jeanette Mcintosh, DOB: 09/29/1940  MRN: [id number]   Case Closure Note  [name] was enrolled in a chronic special needs plan for Diabetes. RNCM received notification that client has dis-enrolled from the [employer] plan, therefore RNCM is no longer able to follow as client's CSNP Chronic Care Management Coordinator.   Plan:  Case closed as client has disenrolled from the [place] (CSNP) Send case closure letter to primary care provider. [employer]) will notify client of dis-enrollment from plan.  Kelli Churn RN, CCM, [place] Management Coordinator [facility] Care Management [phone]

## 2019-10-24 ENCOUNTER — Other Ambulatory Visit: Payer: Self-pay | Admitting: Adult Health

## 2019-10-24 DIAGNOSIS — F5101 Primary insomnia: Secondary | ICD-10-CM

## 2019-10-30 NOTE — Telephone Encounter (Signed)
Okay for refill?  

## 2019-11-14 ENCOUNTER — Other Ambulatory Visit: Payer: Self-pay

## 2019-11-14 ENCOUNTER — Ambulatory Visit: Payer: Medicare HMO | Admitting: Cardiology

## 2019-11-14 ENCOUNTER — Encounter: Payer: Self-pay | Admitting: Cardiology

## 2019-11-14 VITALS — BP 108/65 | HR 73 | Temp 97.6°F | Resp 16 | Ht 62.0 in | Wt 142.6 lb

## 2019-11-14 DIAGNOSIS — R002 Palpitations: Secondary | ICD-10-CM

## 2019-11-14 DIAGNOSIS — I4892 Unspecified atrial flutter: Secondary | ICD-10-CM

## 2019-11-14 DIAGNOSIS — I10 Essential (primary) hypertension: Secondary | ICD-10-CM

## 2019-11-14 NOTE — Progress Notes (Signed)
Primary Physician/Referring:  Dorothyann Peng, NP  Patient ID: Jeanette Mcintosh, female    DOB: 09/26/1940, 79 y.o.   MRN: [id number]  Chief Complaint  Patient presents with  . Irregular Heart Beat  . Hypertension   HPI:    Jeanette Mcintosh  is a 79 y.o. Hypertension, hyperlipidemia,  controlled diabetes mellitus on insulin,  In 2018 she presented to the emergency room with flu and typical atrial flutter by EKG but was erroneously diagnosed as having atrial fibrillation, spontaneously converted to sinus rhythm, did not want to be on anti-coagulation. In 2014 or 2013, she was told to have atrial fibrillation that no documented episodes in spite of extensive research by me at that time.  She is tolerating the present medications without any side effects. In spite of diabetes and vascular risk factors, she is reluctant to taking statins.  She made this appointment in view of hypertension and also palpitations.  On further questioning, she recently brought an extensive blood pressure apparatus and this recording revealed occasional episodes of skipped beats.  She is completely asymptomatic.  She has not had any rapid heartbeat.  She has also noticed occasionally the blood pressure has been very high, highest blood pressure according of [id number] mmHg systolic.  But mostly blood pressure has been well controlled.  She does feel headaches when the blood pressure goes up very high.  Past Medical History:  Diagnosis Date  . Acute kidney injury superimposed on chronic kidney disease ([place]) 11/09/2016  . Arthritis   . Asthma   . Blood transfusion without reported diagnosis   . Cancer (HCC)    Cervical Cancer   . Cataract   . COPD (chronic obstructive pulmonary disease) ([place])   . Eczema   . GERD (gastroesophageal reflux disease)   . Hyperlipidemia   . Hypertension   . IDDM (insulin dependent diabetes mellitus)   . Irritable bowel syndrome   . Macular degeneration   . PAF (paroxysmal atrial  fibrillation) ([place]) 2007  . Paroxysmal atrial fibrillation ([place]) 11/09/2016  . Paroxysmal atrial flutter ([place])   . Thyroid disease    Past Surgical History:  Procedure Laterality Date  . ABDOMINAL HYSTERECTOMY  1971   Partial  . APPENDECTOMY  1971  . CATARACT EXTRACTION  2002  . CHOLECYSTECTOMY, LAPAROSCOPIC  2011  . HEMORRHOID SURGERY  1990's   x2  . renal doppler  12/12/06  . TOTAL HIP ARTHROPLASTY  2012   Left  . TOTAL HIP ARTHROPLASTY  2002   Right  . TRIGGER FINGER RELEASE  1990's   Several surgeries.  . US ECHOCARDIOGRAPHY  01/20/12   Social History   Tobacco Use  . Smoking status: Former Smoker    Packs/day: 0.25    Years: 25.00    Pack years: 6.25    Types: Cigarettes    Start date: 09/14/1950    Quit date: 09/13/2013    Years since quitting: 6.1  . Smokeless tobacco: Never Used  . Tobacco comment: Pt has stopped and started smoking muliple times over the years.  Amount varied during the years.  Substance Use Topics  . Alcohol use: Yes    Alcohol/week: 0.0 standard drinks    Comment: 1/Evening    Family History  Problem Relation Age of Onset  . Basal cell carcinoma Father   . Dementia Father   . Parkinson's disease Father   . Cancer Other        All (5) had Hysterectomies and high dose Estrogen in  1950's were all diagnosed with Breast Cancer.  . Glaucoma Mother   . Allergic rhinitis Mother   . Parkinson's disease Paternal Grandmother   . Eczema Jeanette Mcintosh      ROS  Review of Systems  Constitution: Positive for malaise/fatigue.  Cardiovascular: Negative for chest pain, dyspnea on exertion and leg swelling.  Musculoskeletal: Positive for joint pain.  Gastrointestinal: Negative for melena.  Neurological: Positive for paresthesias.   Objective   Vitals with BMI 11/14/2019 06/15/2019 05/01/2019  Height 5\' 2"  5\' 2"  5\' 3"   Weight 142 lbs 10 oz 144 lbs 142 lbs  BMI 26.08 [id number]  Systolic 123XX123 99 123XX123  Diastolic 65 51 66  Pulse 73 66 -    Blood pressure 108/65,  pulse 73, temperature 97.6 F (36.4 C), temperature source Temporal, resp. rate 16, height 5\' 2"  (1.575 m), weight 142 lb 9.6 oz (64.7 kg), SpO2 96 %. Body mass index is 26.08 kg/m.   Physical Exam  Constitutional:  Moderately built and petite in no acute distress  Cardiovascular: Normal rate, regular rhythm, normal heart sounds and intact distal pulses. Exam reveals no gallop.  No murmur heard. No JVD, no leg edema  Pulmonary/Chest: Effort normal and breath sounds normal.  Abdominal: Soft. Bowel sounds are normal.  Musculoskeletal:     Cervical back: Neck supple.   Radiology: No results found.  Laboratory examination:   Recent Labs    03/08/19 1214 05/01/19 1044  NA 131* 126*  K 4.9 3.6  CL 94* 88*  CO2 31 30  GLUCOSE 115* 87  BUN 19 16  CREATININE 0.95 0.90  CALCIUM 9.4 9.4   CMP Latest Ref Rng & Units 05/01/2019 03/08/2019 11/11/2016  Glucose 70 - 99 mg/dL 87 115(H) 85  BUN 6 - 23 mg/dL 16 19 10   Creatinine 0.40 - 1.20 mg/dL 0.90 0.95 1.03(H)  Sodium 135 - 145 mEq/L 126(L) 131(L) 136  Potassium 3.5 - 5.1 mEq/L 3.6 4.9 3.4(L)  Chloride 96 - 112 mEq/L 88(L) 94(L) 105  CO2 19 - 32 mEq/L 30 31 23   Calcium 8.4 - 10.5 mg/dL 9.4 9.4 8.1(L)  Total Protein 6.0 - 8.3 g/dL 6.8 - 5.2(L)  Total Bilirubin 0.2 - 1.2 mg/dL 0.9 - 0.7  Alkaline Phos 39 - 117 U/L 77 - 58  AST 0 - 37 U/L 18 - 26  ALT 0 - 35 U/L 13 - 16   CBC Latest Ref Rng & Units 05/01/2019 11/11/2016 11/10/2016  WBC 4.0 - 10.5 K/uL 5.7 3.9(L) 3.5(L)  Hemoglobin 12.0 - 15.0 g/dL 13.4 12.1 13.4  Hematocrit 36.0 - 46.0 % 38.2 35.2(L) 37.2  Platelets 150.0 - 400.0 K/uL 342.0 179 206   Lipid Panel     Component Value Date/Time   CHOL 212 (H) 05/01/2019 1044   TRIG 120.0 05/01/2019 1044   HDL 54.80 05/01/2019 1044   CHOLHDL 4 05/01/2019 1044   VLDL 24.0 05/01/2019 1044   LDLCALC 134 (H) 05/01/2019 1044   HEMOGLOBIN A1C Lab Results  Component Value Date   HGBA1C 6.6 (H) 05/01/2019   MPG 134 11/10/2016    TSH Recent Labs    05/01/19 1044  TSH 0.54   Medications and allergies   Allergies  Allergen Reactions  . Morphine And Related Nausea Only  . Phenergan [Promethazine Hcl] Other (See Comments)    Restless leg  . Ambien [Zolpidem] Other (See Comments)    Unknown  . Dicyclomine Other (See Comments)    constipation Other reaction(s): GI Upset (intolerance) constipation  .  Diphenhydramine Other (See Comments)    Restless leg Restless leg  . Erythromycin Nausea And Vomiting  . Ivp Dye [Iodinated Diagnostic Agents] Nausea And Vomiting  . Ondansetron Other (See Comments)    Unknown  . Propoxyphene Other (See Comments)    Unknown  . Shellfish Allergy Nausea And Vomiting  . Statins Other (See Comments)    Unknown  . Vitamin E Other (See Comments)    Hot flashes   Current Outpatient Medications  Medication Instructions  . albuterol (PROAIR HFA) 108 (90 Base) MCG/ACT inhaler 2 puffs, Inhalation, Every 4 hours PRN  . albuterol (PROVENTIL) 2.5 mg, Nebulization, Every 6 hours PRN  . aspirin EC 81 mg, Oral, Daily  . b complex vitamins tablet 1 tablet, Oral, Daily  . benzonatate (TESSALON) 100 mg, Oral, As needed  . budesonide-formoterol (SYMBICORT) 160-4.5 MCG/ACT inhaler 2 puffs, Inhalation, 2 times daily  . cetirizine (ZYRTEC) 10 mg, Oral, Daily  . Cholecalciferol (VITAMIN D PO) 4,000 Units, Oral, Daily  . clonazePAM (KLONOPIN) 1 MG tablet TAKE 1/2 TO 3/4 TABLET AT NIGHT BEFORE BEDTIME FOR ANXIETY AND SLEEP  . FOLIC ACID PO [id number] mcg, Oral, Daily  . glucose blood (ONETOUCH VERIO) test strip Use to test blood glucose three times daily.  Marland Kitchen guaiFENesin (MUCINEX) 1,200 mg, Oral, 2 times daily  . hydrochlorothiazide (HYDRODIURIL) 12.5 mg, Oral, Daily  . insulin glargine (LANTUS) 100 UNIT/ML injection USE 12-20 UNITS DAILY AS DIRECTED  . Insulin Syringe-Needle U-100 (BD INSULIN SYRINGE ULTRAFINE) 31G X 15/64" 0.3 ML MISC USE TO INJECT 12-20 UNITS OF INSULIN DAILY.  Marland Kitchen latanoprost  (XALATAN) 0.005 % ophthalmic solution 1 drop, Both Eyes, Daily at bedtime  . levothyroxine (SYNTHROID) 125 mcg, Oral, Daily before breakfast  . losartan (COZAAR) 100 mg, Oral, Daily  . meloxicam (MOBIC) 7.5 mg, Oral, Daily  . metoprolol tartrate (LOPRESSOR) 25 MG tablet TAKE 1 AND 1/2 TABLETS(37.5 MG) BY MOUTH TWICE DAILY  . NON FORMULARY 11 g, Oral, Daily, [place]  . PREBIOTIC PRODUCT PO Oral, 16 Strain   . RUTIN PO 500 mg, Oral, Daily at bedtime  . TURMERIC PO 1,500 mg, Oral, Daily, With Black Pepper and [place]wellia  . VITAMIN A PO 10,000 Units, Oral, Daily  . vitamin C (ASCORBIC ACID) 250 mg, Oral, Daily    Cardiac Studies:   Carotid artery duplex 2017/05/07: No significant bilateral external or common carotid artery disease.  Echocardiogram 11/10/2016: Normal LV systolic function, he is [id number].  Normal atrial size.  Mild TR, mild pulmonary hypertension, PA pressure 35 mmHg.  Assessment     ICD-10-CM   1. Paroxysmal atrial flutter (HCC) - Typical  I48.92   2. Palpitations  R00.2 EKG 12-Lead  3. Essential hypertension  I10     EKG 11/16/2019: Normal sinus rhythm at rate of 63 bpm, normal axis.  No evidence of ischemia, normal EKG.   No significant change from  EKG 06/15/2019.   EKG 11/09/2016: Atrial flutter with 2:1 conduction with RVR, ventricle rate 148 bpm.  Recommendations:   Richardean Aydin  is a 79 y.o. Hypertension, hyperlipidemia,  controlled diabetes mellitus on insulin,  In 2018 she presented to the emergency room with flu and typical atrial flutter by EKG but was erroneously diagnosed as having atrial fibrillation, spontaneously converted to sinus rhythm, did not want to be on anti-coagulation. In 2014 or 2013, she was told to have atrial fibrillation that no documented episodes in spite of extensive research by me at that time.  She is tolerating the  present medications without any side effects. In spite of diabetes and vascular risk factors, she is reluctant to  taking statins.  She made this appointment in view of hypertension and also palpitations.  On questioning, her blood pressure apparatus had recorded occasional episodes of ectopy and she has had rare episodes of elevated blood pressure but mostly well-controlled blood pressure from home.  I simply reassured her.  I reviewed her EKG and also her external labs.  She does not want to be on statins, no changes were done by me today with regard to her medications.  If she continues to have elevated blood pressure more frequently or palpitations more frequently she will contact me.  We could certainly consider an event monitor at that time.  I will see her back in 1 year.  also reviewed her labs from the PCP.  Adrian Prows, MD, [facility] 11/14/2019, 10:57 AM [facility]. [place] Office: [phone]

## 2019-12-10 ENCOUNTER — Other Ambulatory Visit: Payer: Self-pay | Admitting: Cardiology

## 2019-12-10 DIAGNOSIS — I10 Essential (primary) hypertension: Secondary | ICD-10-CM

## 2019-12-10 DIAGNOSIS — I480 Paroxysmal atrial fibrillation: Secondary | ICD-10-CM

## 2019-12-12 ENCOUNTER — Encounter: Payer: Self-pay | Admitting: Adult Health

## 2019-12-12 NOTE — Telephone Encounter (Signed)
Please review

## 2019-12-17 ENCOUNTER — Ambulatory Visit: Payer: HMO | Admitting: Cardiology

## 2020-01-10 ENCOUNTER — Telehealth: Payer: Self-pay | Admitting: Adult Health

## 2020-01-10 NOTE — Chronic Care Management (AMB) (Signed)
°  Chronic Care Management   Outreach Note  01/10/2020 Name: Jesselin Grosjean MRN: [id number] DOB: 25-Jun-1941  Referred by: Dorothyann Peng, NP Reason for referral : Chronic Care Management   An unsuccessful telephone outreach was attempted today. The patient was referred to the pharmacist for assistance with care management and care coordination.   Follow Up Plan:   [place]

## 2020-02-15 NOTE — Telephone Encounter (Signed)
From patient.

## 2020-02-20 ENCOUNTER — Other Ambulatory Visit: Payer: Self-pay | Admitting: Family Medicine

## 2020-02-21 ENCOUNTER — Other Ambulatory Visit: Payer: Self-pay | Admitting: Family Medicine

## 2020-02-21 ENCOUNTER — Emergency Department (HOSPITAL_COMMUNITY): Payer: Medicare HMO

## 2020-02-21 ENCOUNTER — Inpatient Hospital Stay (HOSPITAL_COMMUNITY)
Admit: 2020-02-21 | Discharge: 2020-02-23 | DRG: 392 | Disposition: A | Discharge status: Home / Self Care | Payer: Medicare HMO | Attending: Internal Medicine | Admitting: Internal Medicine

## 2020-02-21 ENCOUNTER — Encounter (HOSPITAL_COMMUNITY): Payer: Self-pay

## 2020-02-21 DIAGNOSIS — E785 Hyperlipidemia, unspecified: Secondary | ICD-10-CM | POA: Diagnosis present

## 2020-02-21 DIAGNOSIS — I4892 Unspecified atrial flutter: Secondary | ICD-10-CM | POA: Diagnosis present

## 2020-02-21 DIAGNOSIS — Z9049 Acquired absence of other specified parts of digestive tract: Secondary | ICD-10-CM

## 2020-02-21 DIAGNOSIS — Z79899 Other long term (current) drug therapy: Secondary | ICD-10-CM

## 2020-02-21 DIAGNOSIS — E1121 Type 2 diabetes mellitus with diabetic nephropathy: Secondary | ICD-10-CM

## 2020-02-21 DIAGNOSIS — I480 Paroxysmal atrial fibrillation: Secondary | ICD-10-CM | POA: Diagnosis present

## 2020-02-21 DIAGNOSIS — K5732 Diverticulitis of large intestine without perforation or abscess without bleeding: Principal | ICD-10-CM | POA: Diagnosis present

## 2020-02-21 DIAGNOSIS — Z792 Long term (current) use of antibiotics: Secondary | ICD-10-CM

## 2020-02-21 DIAGNOSIS — I739 Peripheral vascular disease, unspecified: Secondary | ICD-10-CM

## 2020-02-21 DIAGNOSIS — Z79890 Hormone replacement therapy (postmenopausal): Secondary | ICD-10-CM

## 2020-02-21 DIAGNOSIS — Z808 Family history of malignant neoplasm of other organs or systems: Secondary | ICD-10-CM

## 2020-02-21 DIAGNOSIS — K219 Gastro-esophageal reflux disease without esophagitis: Secondary | ICD-10-CM | POA: Diagnosis present

## 2020-02-21 DIAGNOSIS — J449 Chronic obstructive pulmonary disease, unspecified: Secondary | ICD-10-CM | POA: Diagnosis present

## 2020-02-21 DIAGNOSIS — Z794 Long term (current) use of insulin: Secondary | ICD-10-CM

## 2020-02-21 DIAGNOSIS — E871 Hypo-osmolality and hyponatremia: Secondary | ICD-10-CM | POA: Diagnosis present

## 2020-02-21 DIAGNOSIS — E1159 Type 2 diabetes mellitus with other circulatory complications: Secondary | ICD-10-CM | POA: Diagnosis present

## 2020-02-21 DIAGNOSIS — K5900 Constipation, unspecified: Secondary | ICD-10-CM | POA: Diagnosis present

## 2020-02-21 DIAGNOSIS — Z66 Do not resuscitate: Secondary | ICD-10-CM | POA: Diagnosis present

## 2020-02-21 DIAGNOSIS — E039 Hypothyroidism, unspecified: Secondary | ICD-10-CM

## 2020-02-21 DIAGNOSIS — Z803 Family history of malignant neoplasm of breast: Secondary | ICD-10-CM

## 2020-02-21 DIAGNOSIS — Z820 Family history of epilepsy and other diseases of the nervous system: Secondary | ICD-10-CM

## 2020-02-21 DIAGNOSIS — Z87891 Personal history of nicotine dependence: Secondary | ICD-10-CM

## 2020-02-21 DIAGNOSIS — Z8541 Personal history of malignant neoplasm of cervix uteri: Secondary | ICD-10-CM

## 2020-02-21 DIAGNOSIS — Z888 Allergy status to other drugs, medicaments and biological substances status: Secondary | ICD-10-CM

## 2020-02-21 DIAGNOSIS — F419 Anxiety disorder, unspecified: Secondary | ICD-10-CM | POA: Diagnosis present

## 2020-02-21 DIAGNOSIS — E1169 Type 2 diabetes mellitus with other specified complication: Secondary | ICD-10-CM | POA: Diagnosis present

## 2020-02-21 DIAGNOSIS — Z7982 Long term (current) use of aspirin: Secondary | ICD-10-CM

## 2020-02-21 DIAGNOSIS — I152 Hypertension secondary to endocrine disorders: Secondary | ICD-10-CM | POA: Diagnosis present

## 2020-02-21 DIAGNOSIS — Z96643 Presence of artificial hip joint, bilateral: Secondary | ICD-10-CM | POA: Diagnosis present

## 2020-02-21 DIAGNOSIS — Z885 Allergy status to narcotic agent status: Secondary | ICD-10-CM

## 2020-02-21 DIAGNOSIS — H3530 Unspecified macular degeneration: Secondary | ICD-10-CM | POA: Diagnosis present

## 2020-02-21 DIAGNOSIS — Z83511 Family history of glaucoma: Secondary | ICD-10-CM

## 2020-02-21 DIAGNOSIS — K5792 Diverticulitis of intestine, part unspecified, without perforation or abscess without bleeding: Secondary | ICD-10-CM | POA: Diagnosis present

## 2020-02-21 DIAGNOSIS — Z20822 Contact with and (suspected) exposure to covid-19: Secondary | ICD-10-CM | POA: Diagnosis present

## 2020-02-21 DIAGNOSIS — I4891 Unspecified atrial fibrillation: Secondary | ICD-10-CM

## 2020-02-21 DIAGNOSIS — Z87440 Personal history of urinary (tract) infections: Secondary | ICD-10-CM

## 2020-02-21 DIAGNOSIS — Z791 Long term (current) use of non-steroidal anti-inflammatories (NSAID): Secondary | ICD-10-CM

## 2020-02-21 LAB — CBC
HCT: 40.9 % (ref 36.0–46.0)
Hemoglobin: 14.6 g/dL (ref 12.0–15.0)
MCH: 32.4 pg (ref 26.0–34.0)
MCHC: 35.7 g/dL (ref 30.0–36.0)
MCV: 90.7 fL (ref 80.0–100.0)
Platelets: 376 10*3/uL (ref 150–400)
RBC: 4.51 MIL/uL (ref 3.87–5.11)
RDW: 11.4 % — ABNORMAL LOW (ref 11.5–15.5)
WBC: 13.6 10*3/uL — ABNORMAL HIGH (ref 4.0–10.5)
nRBC: 0 % (ref 0.0–0.2)

## 2020-02-21 LAB — COMPREHENSIVE METABOLIC PANEL
ALT: 18 U/L (ref 0–44)
AST: 23 U/L (ref 15–41)
Albumin: 4.7 g/dL (ref 3.5–5.0)
Alkaline Phosphatase: 78 U/L (ref 38–126)
Anion gap: 15 (ref 5–15)
BUN: 14 mg/dL (ref 8–23)
CO2: 24 mmol/L (ref 22–32)
Calcium: 9.3 mg/dL (ref 8.9–10.3)
Chloride: 88 mmol/L — ABNORMAL LOW (ref 98–111)
Creatinine, Ser: 0.9 mg/dL (ref 0.44–1.00)
GFR calc Af Amer: >60 mL/min (ref >60)
GFR calc non Af Amer: >60 mL/min (ref >60)
Glucose, Bld: 161 mg/dL — ABNORMAL HIGH (ref 70–99)
Potassium: 3.7 mmol/L (ref 3.5–5.1)
Sodium: 127 mmol/L — ABNORMAL LOW (ref 135–145)
Total Bilirubin: 1.1 mg/dL (ref 0.3–1.2)
Total Protein: 7.7 g/dL (ref 6.5–8.1)

## 2020-02-21 LAB — URINALYSIS, ROUTINE W REFLEX MICROSCOPIC
Bilirubin Urine: NEGATIVE
Glucose, UA: NEGATIVE mg/dL
Hgb urine dipstick: NEGATIVE
Ketones, ur: 5 mg/dL — AB
Leukocytes,Ua: NEGATIVE
Nitrite: NEGATIVE
Protein, ur: NEGATIVE mg/dL
Specific Gravity, Urine: 1.003 — ABNORMAL LOW (ref 1.005–1.030)
pH: 7 (ref 5.0–8.0)

## 2020-02-21 LAB — SODIUM, URINE, RANDOM: Sodium, Ur: 26 mmol/L

## 2020-02-21 LAB — GLUCOSE, CAPILLARY: Glucose-Capillary: 136 mg/dL — ABNORMAL HIGH (ref 70–99)

## 2020-02-21 LAB — SARS CORONAVIRUS 2 BY RT PCR (HOSPITAL ORDER, PERFORMED IN ~~LOC~~ HOSPITAL LAB): SARS Coronavirus 2: NEGATIVE

## 2020-02-21 LAB — LIPASE, BLOOD: Lipase: 32 U/L (ref 11–51)

## 2020-02-21 MED ORDER — ACETAMINOPHEN 650 MG RE SUPP: 650.0000 mg | Freq: Four times a day (QID) | RECTAL | Status: DC | PRN

## 2020-02-21 MED ORDER — SODIUM CHLORIDE 0.9 % IV SOLN: INTRAVENOUS | Status: AC

## 2020-02-21 MED ORDER — CLONAZEPAM 0.5 MG PO TABS
0.5000 mg | ORAL_TABLET | Freq: Every day | ORAL | Status: DC
  Administered 2020-02-21 – 2020-02-22 (×2): 0.5 mg via ORAL
  Filled 2020-02-21 (×2): qty 1

## 2020-02-21 MED ORDER — SODIUM CHLORIDE 0.9 % IV BOLUS
1000.0000 mL | Freq: Once | INTRAVENOUS | Status: AC
  Administered 2020-02-21: 1000 mL via INTRAVENOUS

## 2020-02-21 MED ORDER — ONDANSETRON HCL 4 MG PO TABS: 4.0000 mg | ORAL_TABLET | Freq: Four times a day (QID) | ORAL | Status: DC | PRN

## 2020-02-21 MED ORDER — GUAIFENESIN ER 600 MG PO TB12
1200.0000 mg | ORAL_TABLET | Freq: Two times a day (BID) | ORAL | Status: DC
  Administered 2020-02-21 – 2020-02-23 (×4): 1200 mg via ORAL
  Filled 2020-02-21 (×4): qty 2

## 2020-02-21 MED ORDER — LEVOTHYROXINE SODIUM 25 MCG PO TABS
125.0000 ug | ORAL_TABLET | Freq: Every day | ORAL | Status: DC
  Administered 2020-02-22 – 2020-02-23 (×2): 125 ug via ORAL
  Filled 2020-02-21 (×2): qty 1

## 2020-02-21 MED ORDER — METRONIDAZOLE IN NACL 5-0.79 MG/ML-% IV SOLN
500.0000 mg | Freq: Once | INTRAVENOUS | Status: AC
  Administered 2020-02-21: 500 mg via INTRAVENOUS
  Filled 2020-02-21: qty 100

## 2020-02-21 MED ORDER — SODIUM CHLORIDE 0.9% FLUSH: 3.0000 mL | Freq: Once | INTRAVENOUS | Status: DC

## 2020-02-21 MED ORDER — NITROFURANTOIN MACROCRYSTAL 100 MG PO CAPS
100.0000 mg | ORAL_CAPSULE | Freq: Every day | ORAL | Status: DC
  Administered 2020-02-22 – 2020-02-23 (×2): 100 mg via ORAL
  Filled 2020-02-21 (×2): qty 1

## 2020-02-21 MED ORDER — ONDANSETRON HCL 4 MG/2ML IJ SOLN
4.0000 mg | Freq: Four times a day (QID) | INTRAMUSCULAR | Status: DC | PRN
  Administered 2020-02-21 – 2020-02-23 (×3): 4 mg via INTRAVENOUS
  Filled 2020-02-21 (×3): qty 2

## 2020-02-21 MED ORDER — SODIUM CHLORIDE 0.9 % IV SOLN
2.0000 g | INTRAVENOUS | Status: DC
  Filled 2020-02-21: qty 20

## 2020-02-21 MED ORDER — MOMETASONE FURO-FORMOTEROL FUM 200-5 MCG/ACT IN AERO
2.0000 | INHALATION_SPRAY | Freq: Two times a day (BID) | RESPIRATORY_TRACT | Status: DC
  Filled 2020-02-21: qty 8.8

## 2020-02-21 MED ORDER — SODIUM CHLORIDE 0.9 % IV SOLN
2.0000 g | Freq: Once | INTRAVENOUS | Status: AC
  Administered 2020-02-21: 2 g via INTRAVENOUS
  Filled 2020-02-21: qty 20

## 2020-02-21 MED ORDER — FENTANYL CITRATE (PF) 100 MCG/2ML IJ SOLN
25.0000 ug | Freq: Once | INTRAMUSCULAR | Status: AC
  Administered 2020-02-21: 25 ug via INTRAVENOUS
  Filled 2020-02-21: qty 2

## 2020-02-21 MED ORDER — OXYCODONE HCL 5 MG PO TABS
5.0000 mg | ORAL_TABLET | ORAL | Status: DC | PRN
  Administered 2020-02-21 – 2020-02-22 (×4): 5 mg via ORAL
  Filled 2020-02-21 (×5): qty 1

## 2020-02-21 MED ORDER — METOPROLOL TARTRATE 5 MG/5ML IV SOLN
2.5000 mg | Freq: Once | INTRAVENOUS | Status: DC
  Filled 2020-02-21: qty 5

## 2020-02-21 MED ORDER — ENOXAPARIN SODIUM 40 MG/0.4ML ~~LOC~~ SOLN
40.0000 mg | Freq: Every day | SUBCUTANEOUS | Status: DC
  Administered 2020-02-21 – 2020-02-22 (×2): 40 mg via SUBCUTANEOUS
  Filled 2020-02-21 (×2): qty 0.4

## 2020-02-21 MED ORDER — METOPROLOL TARTRATE 5 MG/5ML IV SOLN: 5.0000 mg | Freq: Once | INTRAVENOUS | Status: DC

## 2020-02-21 MED ORDER — METRONIDAZOLE IN NACL 5-0.79 MG/ML-% IV SOLN
500.0000 mg | Freq: Three times a day (TID) | INTRAVENOUS | Status: DC
  Administered 2020-02-22 (×2): 500 mg via INTRAVENOUS
  Filled 2020-02-21 (×3): qty 100

## 2020-02-21 MED ORDER — MORPHINE SULFATE (PF) 2 MG/ML IV SOLN
1.0000 mg | INTRAVENOUS | Status: DC | PRN
  Administered 2020-02-22: 1 mg via INTRAVENOUS
  Filled 2020-02-21: qty 1

## 2020-02-21 MED ORDER — METOPROLOL TARTRATE 25 MG PO TABS
37.5000 mg | ORAL_TABLET | Freq: Two times a day (BID) | ORAL | Status: DC
  Administered 2020-02-21 – 2020-02-23 (×3): 37.5 mg via ORAL
  Filled 2020-02-21: qty 1
  Filled 2020-02-21: qty 2
  Filled 2020-02-21: qty 1

## 2020-02-21 MED ORDER — LORAZEPAM 2 MG/ML IJ SOLN: 0.5000 mg | Freq: Once | INTRAMUSCULAR | Status: DC

## 2020-02-21 MED ORDER — LOSARTAN POTASSIUM 50 MG PO TABS
100.0000 mg | ORAL_TABLET | Freq: Every day | ORAL | Status: DC
  Administered 2020-02-22 – 2020-02-23 (×2): 100 mg via ORAL
  Filled 2020-02-21 (×2): qty 2

## 2020-02-21 MED ORDER — FENTANYL CITRATE (PF) 100 MCG/2ML IJ SOLN
50.0000 ug | Freq: Once | INTRAMUSCULAR | Status: AC
  Administered 2020-02-21: 50 ug via INTRAVENOUS
  Filled 2020-02-21: qty 2

## 2020-02-21 MED ORDER — SODIUM CHLORIDE 0.9% FLUSH
3.0000 mL | Freq: Two times a day (BID) | INTRAVENOUS | Status: DC
  Administered 2020-02-23: 3 mL via INTRAVENOUS

## 2020-02-21 MED ORDER — ACETAMINOPHEN 325 MG PO TABS
650.0000 mg | ORAL_TABLET | Freq: Four times a day (QID) | ORAL | Status: DC | PRN
  Administered 2020-02-22 – 2020-02-23 (×3): 650 mg via ORAL
  Filled 2020-02-21 (×3): qty 2

## 2020-02-21 MED ORDER — CARBOXYMETHYLCELLULOSE SODIUM 0.5 % OP SOLN: 1.0000 [drp] | Freq: Three times a day (TID) | OPHTHALMIC | Status: DC | PRN

## 2020-02-21 MED ORDER — INSULIN ASPART 100 UNIT/ML ~~LOC~~ SOLN: 0.0000 [IU] | Freq: Three times a day (TID) | SUBCUTANEOUS | Status: DC

## 2020-02-21 MED ORDER — ALBUTEROL SULFATE (2.5 MG/3ML) 0.083% IN NEBU: 3.0000 mL | INHALATION_SOLUTION | RESPIRATORY_TRACT | Status: DC | PRN

## 2020-02-21 MED ORDER — INSULIN GLARGINE 100 UNIT/ML ~~LOC~~ SOLN
8.0000 [IU] | Freq: Every day | SUBCUTANEOUS | Status: DC
  Administered 2020-02-22: 8 [IU] via SUBCUTANEOUS
  Filled 2020-02-21 (×2): qty 0.08

## 2020-02-21 MED ORDER — POLYVINYL ALCOHOL 1.4 % OP SOLN
1.0000 [drp] | Freq: Three times a day (TID) | OPHTHALMIC | Status: DC | PRN
  Filled 2020-02-21: qty 15

## 2020-02-21 MED ORDER — ASPIRIN EC 81 MG PO TBEC
81.0000 mg | DELAYED_RELEASE_TABLET | Freq: Every day | ORAL | Status: DC
  Administered 2020-02-22 – 2020-02-23 (×2): 81 mg via ORAL
  Filled 2020-02-21 (×2): qty 1

## 2020-02-21 NOTE — ED Triage Notes (Signed)
Reports constipation since last Friday.  C/o abdominal cramping intermittently.  C/O nausea intermittently  A/Ox4

## 2020-02-21 NOTE — ED Notes (Signed)
ED TO INPATIENT HANDOFF REPORT  Name/Age/Gender Jeanette Mcintosh 79 y.o. female  Code Status Code Status History    Date Active Date Inactive Code Status Order ID Comments User Context   11/10/2016 0229 11/11/2016 1522 DNR [id number]  Edwin Dada, MD Inpatient   Advance Care Planning Activity    Questions for Most Recent Historical Code Status (Order [id number])    Question Answer   In the event of cardiac or respiratory ARREST Do not call a "code blue"   In the event of cardiac or respiratory ARREST Do not perform Intubation, CPR, defibrillation or ACLS   In the event of cardiac or respiratory ARREST Use medication by any route, position, wound care, and other measures to relive pain and suffering. May use oxygen, suction and manual treatment of airway obstruction as needed for comfort.        Advance Directive Documentation     Most Recent Value  Type of Advance Directive Living will  Pre-existing out of facility DNR order (yellow form or pink MOST form) --  "MOST" Form in Place? --      Home/SNF/Other Home  Chief Complaint Diverticulitis of sigmoid colon [K57.32]  Level of Care/Admitting Diagnosis ED Disposition    ED Disposition Condition [place]: [place] [[id number]]  Level of Care: Telemetry [5]  Admit to tele based on following criteria: Complex arrhythmia (Bradycardia/Tachycardia)  Covid Evaluation: Asymptomatic Screening Protocol (No Symptoms)  Diagnosis: Diverticulitis of sigmoid colon [485462]  Admitting Physician: Lenore Cordia [[id number]]  Attending Physician: Lenore Cordia [[id number]]       Medical History Past Medical History:  Diagnosis Date  . Acute kidney injury superimposed on chronic kidney disease ([place]) 11/09/2016  . Arthritis   . Asthma   . Blood transfusion without reported diagnosis   . Cancer (HCC)    Cervical Cancer   . Cataract   . COPD (chronic obstructive pulmonary disease) ([place])    . Eczema   . GERD (gastroesophageal reflux disease)   . Hyperlipidemia   . Hypertension   . IDDM (insulin dependent diabetes mellitus)   . Irritable bowel syndrome   . Macular degeneration   . PAF (paroxysmal atrial fibrillation) ([place]) 2007  . Paroxysmal atrial fibrillation ([place]) 11/09/2016  . Paroxysmal atrial flutter ([place])   . Thyroid disease     Allergies Allergies  Allergen Reactions  . Morphine And Related Nausea Only  . Phenergan [Promethazine Hcl] Other (See Comments)    Restless leg  . Ambien [Zolpidem] Other (See Comments)    Unknown  . Dicyclomine Other (See Comments)    constipation Other reaction(s): GI Upset (intolerance) constipation  . Diphenhydramine Other (See Comments)    Restless leg Restless leg  . Erythromycin Nausea And Vomiting  . Ivp Dye [Iodinated Diagnostic Agents] Nausea And Vomiting  . Ondansetron Other (See Comments)    Unknown  . Propoxyphene Other (See Comments)    Unknown  . Shellfish Allergy Nausea And Vomiting  . Statins Other (See Comments)    Unknown  . Vitamin E Other (See Comments)    Hot flashes    IV Location/Drains/Wounds Patient Lines/Drains/Airways Status    Active Line/Drains/Airways    Name Placement date Placement time Site Days   Peripheral IV 02/21/20 Anterior;Right Forearm 02/21/20  1842  Forearm  less than 1          Labs/Imaging Results for orders placed or performed during the hospital encounter of 02/21/20 (from  the past 48 hour(s))  Lipase, blood     Status: None   Collection Time: 02/21/20  4:38 PM  Result Value Ref Range   Lipase 32 11 - 51 U/L    Comment: Performed at [facility], [place] [address] [place], [place] [address]  Comprehensive metabolic panel     Status: Abnormal   Collection Time: 02/21/20  4:38 PM  Result Value Ref Range   Sodium 127 (L) 135 - 145 mmol/L   Potassium 3.7 3.5 - 5.1 mmol/L   Chloride 88 (L) 98 - 111 mmol/L   CO2 24 22 - 32 mmol/L   Glucose, Bld 161  (H) 70 - 99 mg/dL    Comment: Glucose reference range applies only to samples taken after fasting for at least 8 hours.   BUN 14 8 - 23 mg/dL   Creatinine, Ser 0.90 0.44 - 1.00 mg/dL   Calcium 9.3 8.9 - 10.3 mg/dL   Total Protein 7.7 6.5 - 8.1 g/dL   Albumin 4.7 3.5 - 5.0 g/dL   AST 23 15 - 41 U/L   ALT 18 0 - 44 U/L   Alkaline Phosphatase 78 38 - 126 U/L   Total Bilirubin 1.1 0.3 - 1.2 mg/dL   GFR calc non Af Amer >60 >60 mL/min   GFR calc Af Amer >60 >60 mL/min   Anion gap 15 5 - 15    Comment: Performed at [facility], [place] [address], [place], [place] [address]  CBC     Status: Abnormal   Collection Time: 02/21/20  4:38 PM  Result Value Ref Range   WBC 13.6 (H) 4.0 - 10.5 K/uL   RBC 4.51 3.87 - 5.11 MIL/uL   Hemoglobin 14.6 12.0 - 15.0 g/dL   HCT 40.9 36 - 46 %   MCV 90.7 80.0 - 100.0 fL   MCH 32.4 26.0 - 34.0 pg   MCHC 35.7 30.0 - 36.0 g/dL   RDW 11.4 (L) 11.5 - 15.5 %   Platelets 376 150 - 400 K/uL   nRBC 0.0 0.0 - 0.2 %    Comment: Performed at [facility], [place] [address] [place], [place] [address]   CT ABDOMEN PELVIS WO CONTRAST  Result Date: 02/21/2020 CLINICAL DATA:  Constipation for 1 week, abdominal cramping, nausea EXAM: CT ABDOMEN AND PELVIS WITHOUT CONTRAST TECHNIQUE: Multidetector CT imaging of the abdomen and pelvis was performed following the standard protocol without IV contrast. COMPARISON:  11/09/2016 FINDINGS: Lower chest: No acute pleural or parenchymal lung disease. Hepatobiliary: No focal liver abnormality is seen. Status post cholecystectomy. No biliary dilatation. Pancreas: Unremarkable. No pancreatic ductal dilatation or surrounding inflammatory changes. Spleen: Normal in size without focal abnormality. Adrenals/Urinary Tract: No urinary tract calculi or obstructive uropathy. The adrenals are normal. Evaluation of bladder is limited due to streak artifact from bilateral hip arthroplasties. Stomach/Bowel: Prominent  diverticulosis of the sigmoid colon. Mild pericolonic fat stranding at the rectosigmoid junction may reflect acute uncomplicated diverticulitis. No bowel obstruction or ileus. Vascular/Lymphatic: Aortic atherosclerosis. No enlarged abdominal or pelvic lymph nodes. Reproductive: Status post hysterectomy. No adnexal masses. Other: No abdominal wall hernia or abnormality. No abdominopelvic ascites. Musculoskeletal: Bilateral hip arthroplasties are noted. No acute or destructive bony lesions. Reconstructed images demonstrate no additional findings. IMPRESSION: 1. Mild acute diverticulitis at the junction of the rectosigmoid colon. No perforation, fluid collection, or abscess. 2.  Aortic Atherosclerosis (ICD10-I70.0). Electronically Signed   By: Randa Ngo M.D.   On: 02/21/2020  18:20    Pending Labs Unresulted Labs (From admission, onward) Comment          Start     Ordered   02/21/20 2131  Osmolality, urine  Once,   STAT        02/21/20 2130   02/21/20 2131  Osmolality  Add-on,   AD        02/21/20 2130   02/21/20 2131  Sodium, urine, random  Once,   STAT        02/21/20 2130   02/21/20 2012  SARS Coronavirus 2 by RT PCR (hospital order, performed in [place] hospital lab) Nasopharyngeal Nasopharyngeal Swab  (Tier 2 (TAT 2 hrs))  Once,   STAT       Question Answer Comment  Is this test for diagnosis or screening Screening   Symptomatic for COVID-19 as defined by CDC No   Hospitalized for COVID-19 No   Admitted to ICU for COVID-19 No   Previously tested for COVID-19 No   Resident in a congregate (group) care setting No   Employed in healthcare setting No   Pregnant No   Has patient completed COVID vaccination(s) (2 doses of Pfizer/Moderna 1 dose of [employer]) Yes      02/21/20 2011   02/21/20 1632  Urinalysis, Routine w reflex microscopic  ONCE - STAT,   STAT        02/21/20 1632          Vitals/Pain Today's Vitals   02/21/20 2007 02/21/20 2043 02/21/20 2107 02/21/20  2108  BP:  135/73 131/75 131/75  Pulse:  80  79  Resp:  14  18  Temp:      TempSrc:      SpO2:  98%  98%  PainSc: 1        Isolation Precautions No active isolations  Medications Medications  metoprolol tartrate (LOPRESSOR) tablet 37.5 mg (37.5 mg Oral Given 02/21/20 2107)  sodium chloride 0.9 % bolus 1,000 mL (0 mLs Intravenous Stopped 02/21/20 1933)  fentaNYL (SUBLIMAZE) injection 25 mcg (25 mcg Intravenous Given 02/21/20 1918)  cefTRIAXone (ROCEPHIN) 2 g in sodium chloride 0.9 % 100 mL IVPB (0 g Intravenous Stopped 02/21/20 1920)    And  metroNIDAZOLE (FLAGYL) IVPB 500 mg (0 mg Intravenous Stopped 02/21/20 2007)  sodium chloride 0.9 % bolus 1,000 mL (1,000 mLs Intravenous New Bag/Given (Non-Interop) 02/21/20 2041)  fentaNYL (SUBLIMAZE) injection 50 mcg (50 mcg Intravenous Given 02/21/20 2042)    Mobility walks

## 2020-02-21 NOTE — H&P (Signed)
History and Physical    Jeanette Mcintosh IFO:[id number] DOB: 04/16/1941 DOA: 02/21/2020  PCP: Buzzy Han, MD  Patient coming from: Home  I have personally briefly reviewed patient's old medical records in [place]  Chief Complaint: Abdominal pain, nausea, vomiting  HPI: Jeanette Mcintosh is a 79 y.o. female with medical history significant for paroxysmal A. fib/flutter on aspirin alone, COPD, insulin-dependent type 2 diabetes, hypertension, hypothyroidism, and anxiety who presents to the ED for evaluation of abdominal pain, nausea, vomiting.  Patient states she first began to have abdominal pain 6 days ago, more severe in the left lower quadrant but occurring across her abdomen.  She was having constipation and was unable to pass any stool.  She has had decreased oral intake.  She began to take laxatives after discussing with her PCP 2 days ago.  Earlier today she began to have worsening abdominal pain with new nausea and emesis.  She denies any associated chest pain, dyspnea, subjective fevers, or dysuria.  She came to the ED for further evaluation and states she did finally begin to have bowel movements while here.  ED Course:  Initial vitals showed BP 126/78, pulse 94, RR 18, temp 98.2 Fahrenheit, SPO2 96% on room air.  Patient did go into A. fib with RVR, rate high 130s to low 140s before spontaneously converting back to normal sinus rhythm.  Labs are notable for sodium 127, potassium 3.7, chloride 88, bicarb 24, BUN 14, creatinine 0.9, serum glucose 161, LFTs within normal limits, lipase 32, WBC 13.6, hemoglobin 14.6, platelets 376,000.  SARS-CoV-2 PCR is obtained and pending.  CT abdomen/pelvis without contrast shows mild acute diverticulitis at the rectosigmoid colon junction without evidence of perforation, fluid collection, or abscess.  Patient was given 2 L normal saline, IV ceftriaxone and Flagyl, and the hospitalist service was consulted to admit for further  evaluation and management.  Review of Systems: All systems reviewed and are negative except as documented in history of present illness above.   Past Medical History:  Diagnosis Date  . Acute kidney injury superimposed on chronic kidney disease ([place]) 11/09/2016  . Arthritis   . Asthma   . Blood transfusion without reported diagnosis   . Cancer (HCC)    Cervical Cancer   . Cataract   . COPD (chronic obstructive pulmonary disease) ([place])   . Eczema   . GERD (gastroesophageal reflux disease)   . Hyperlipidemia   . Hypertension   . IDDM (insulin dependent diabetes mellitus)   . Irritable bowel syndrome   . Macular degeneration   . PAF (paroxysmal atrial fibrillation) ([place]) 2007  . Paroxysmal atrial fibrillation ([place]) 11/09/2016  . Paroxysmal atrial flutter ([place])   . Thyroid disease     Past Surgical History:  Procedure Laterality Date  . ABDOMINAL HYSTERECTOMY  1971   Partial  . APPENDECTOMY  1971  . CATARACT EXTRACTION  2002  . CHOLECYSTECTOMY, LAPAROSCOPIC  2011  . HEMORRHOID SURGERY  1990's   x2  . renal doppler  12/12/06  . TOTAL HIP ARTHROPLASTY  2012   Left  . TOTAL HIP ARTHROPLASTY  2002   Right  . TRIGGER FINGER RELEASE  1990's   Several surgeries.  . US ECHOCARDIOGRAPHY  01/20/12    Social History:  reports that she quit smoking about 6 years ago. Her smoking use included cigarettes. She started smoking about 69 years ago. She has a 6.25 pack-year smoking history. She has never used smokeless tobacco. She reports current alcohol use.  She reports that she does not use drugs.  Allergies  Allergen Reactions  . Morphine And Related Nausea Only  . Phenergan [Promethazine Hcl] Other (See Comments)    Restless leg  . Ambien [Zolpidem] Other (See Comments)    Unknown  . Dicyclomine Other (See Comments)    constipation Other reaction(s): GI Upset (intolerance) constipation  . Diphenhydramine Other (See Comments)    Restless leg Restless leg  . Erythromycin Nausea  And Vomiting  . Ivp Dye [Iodinated Diagnostic Agents] Nausea And Vomiting  . Ondansetron Other (See Comments)    Unknown  . Propoxyphene Other (See Comments)    Unknown  . Shellfish Allergy Nausea And Vomiting  . Statins Other (See Comments)    Unknown  . Vitamin E Other (See Comments)    Hot flashes    Family History  Problem Relation Age of Onset  . Basal cell carcinoma Father   . Dementia Father   . Parkinson's disease Father   . Cancer Other        All (5) had Hysterectomies and high dose Estrogen in 1950's were all diagnosed with Breast Cancer.  . Glaucoma Mother   . Allergic rhinitis Mother   . Parkinson's disease Paternal Grandmother   . Eczema Son      Prior to Admission medications   Medication Sig Start Date End Date Taking? Authorizing Provider  aspirin EC 81 MG tablet Take 81 mg by mouth daily.   Yes [provider]  b complex vitamins tablet Take 1 tablet by mouth daily.   Yes [provider]  carboxymethylcellulose (REFRESH PLUS) 0.5 % SOLN Place 1 drop into both eyes 3 (three) times daily as needed (dry eyes).   Yes [provider]  cetirizine (ZYRTEC) 10 MG tablet Take 10 mg by mouth daily.   Yes [provider]  Cholecalciferol (VITAMIN D PO) Take 4,000 Units by mouth daily.    Yes [provider]  clonazePAM (KLONOPIN) 1 MG tablet TAKE 1/2 TO 3/4 TABLET AT NIGHT BEFORE BEDTIME FOR ANXIETY AND SLEEP Patient taking differently: Take 0.5 mg by mouth at bedtime.  10/30/19  Yes Nafziger, Tommi Rumps, NP  estrogens, conjugated, (PREMARIN) 0.3 MG tablet Take 0.3 mg by mouth every Monday, Wednesday, and Friday. Take daily for 21 days then do not take for 7 days.   Yes [provider]  FOLIC ACID PO Take 347 mcg by mouth daily.    Yes [provider]  guaiFENesin (MUCINEX) 600 MG 12 hr tablet Take 1,200 mg by mouth 2 (two) times daily.   Yes [provider]  hydrochlorothiazide (HYDRODIURIL) 25 MG tablet  Take 25 mg by mouth daily.   Yes [provider]  latanoprost (XALATAN) 0.005 % ophthalmic solution Place 1 drop into both eyes at bedtime.  10/13/12  Yes [provider]  levothyroxine (SYNTHROID) 125 MCG tablet Take 1 tablet (125 mcg total) by mouth daily before breakfast. 05/01/19  Yes Nafziger, Tommi Rumps, NP  losartan (COZAAR) 100 MG tablet Take 1 tablet (100 mg total) by mouth daily. 05/01/19  Yes Nafziger, Tommi Rumps, NP  meloxicam (MOBIC) 7.5 MG tablet Take 1 tablet (7.5 mg total) by mouth daily. 05/01/19  Yes Nafziger, Tommi Rumps, NP  metoprolol tartrate (LOPRESSOR) 25 MG tablet TAKE 1 AND 1/2 TABLETS(37.5 MG) BY MOUTH TWICE DAILY Patient taking differently: Take 37.5 mg by mouth 2 (two) times daily.  12/10/19  Yes Adrian Prows, MD  nitrofurantoin (MACRODANTIN) 100 MG capsule Take 100 mg by mouth daily.  01/23/20  Yes [provider]  NON FORMULARY Take 11 g by mouth daily. [place]wellia   Yes [provider]  oxymetazoline (AFRIN) 0.05 % nasal spray Place 1 spray into both nostrils 2 (two) times daily as needed for congestion.   Yes [provider]  PREBIOTIC PRODUCT PO Take 1 tablet by mouth in the morning and at bedtime. 16 Strain    Yes [provider]  RUTIN PO Take 500 mg by mouth daily.    Yes [provider]  TURMERIC PO Take 1,500 mg by mouth in the morning and at bedtime. With Black Pepper and Boswellia   Yes [provider]  VITAMIN A PO Take 10,000 Units by mouth daily.    Yes [provider]  vitamin C (ASCORBIC ACID) 250 MG tablet Take 250 mg by mouth daily.   Yes [provider]  albuterol (PROAIR HFA) 108 (90 Base) MCG/ACT inhaler Inhale 2 puffs into the lungs every 4 (four) hours as needed for wheezing or shortness of breath. Patient not taking: Reported on 02/21/2020 03/18/16   Gean Quint, MD  albuterol (PROVENTIL) (2.5 MG/3ML) 0.083% nebulizer solution Take 2.5 mg by nebulization every 6 (six) hours as needed  for wheezing or shortness of breath. Patient not taking: Reported on 02/21/2020    [provider]  benzonatate (TESSALON) 100 MG capsule Take 100 mg by mouth as needed.  Patient not taking: Reported on 02/21/2020 07/21/15   [provider]  budesonide-formoterol (SYMBICORT) 160-4.5 MCG/ACT inhaler Inhale 2 puffs into the lungs 2 (two) times daily. Patient not taking: Reported on 02/21/2020 07/31/19   Dorothyann Peng, NP  glucose blood (ONETOUCH VERIO) test strip Use to test blood glucose three times daily. 05/08/19   Nafziger, Tommi Rumps, NP  hydrochlorothiazide (HYDRODIURIL) 12.5 MG tablet Take 1 tablet (12.5 mg total) by mouth daily. Patient not taking: Reported on 02/21/2020 06/15/19   Adrian Prows, MD  insulin glargine (LANTUS) 100 UNIT/ML injection USE 12-20 UNITS DAILY AS DIRECTED Patient taking differently: Inject 8-20 Units into the skin at bedtime. Takes only if blood sugar more than 150. 07/31/19   Nafziger, Tommi Rumps, NP  Insulin Syringe-Needle U-100 (BD INSULIN SYRINGE ULTRAFINE) 31G X 15/64" 0.3 ML MISC USE TO INJECT 12-20 UNITS OF INSULIN DAILY. 07/31/19   Dorothyann Peng, NP    Physical Exam: Vitals:   02/21/20 1900 02/21/20 1903 02/21/20 1930 02/21/20 2043  BP: (!) 139/93 (!) 139/93 136/83 135/73  Pulse:  80 (!) 138 80  Resp:   20 14  Temp:      TempSrc:      SpO2:  100% 100% 98%   Constitutional: Elderly woman resting supine in bed, NAD, calm, comfortable Eyes: PERRL, lids and conjunctivae normal ENMT: Mucous membranes are moist. Posterior pharynx clear of any exudate or lesions.poor dentition.  Neck: normal, supple, no masses. Respiratory: clear to auscultation bilaterally, no wheezing, no crackles. Normal respiratory effort. No accessory muscle use.  Cardiovascular: Regular rate and rhythm, no murmurs / rubs / gallops. No extremity edema. 2+ pedal pulses. Abdomen: no tenderness after receiving fentanyl, no masses palpated. No hepatosplenomegaly. Bowel sounds positive.    Musculoskeletal: no clubbing / cyanosis. No joint deformity upper and lower extremities. Good ROM, no contractures. Normal muscle tone.  Skin: no rashes, lesions, ulcers. No induration Neurologic: CN 2-12 grossly intact. Sensation intact,Strength 5/5 in all 4.  Psychiatric: Normal judgment and insight. Alert and oriented x 3. Normal mood.     Labs on Admission: I  have personally reviewed following labs and imaging studies  CBC: Recent Labs  Lab 02/21/20 1638  WBC 13.6*  HGB 14.6  HCT 40.9  MCV 90.7  PLT 500   Basic Metabolic Panel: Recent Labs  Lab 02/21/20 1638  NA 127*  K 3.7  CL 88*  CO2 24  GLUCOSE 161*  BUN 14  CREATININE 0.90  CALCIUM 9.3   GFR: CrCl cannot be calculated (Unknown ideal weight.). Liver Function Tests: Recent Labs  Lab 02/21/20 1638  AST 23  ALT 18  ALKPHOS 78  BILITOT 1.1  PROT 7.7  ALBUMIN 4.7   Recent Labs  Lab 02/21/20 1638  LIPASE 32   No results for input(s): AMMONIA in the last 168 hours. Coagulation Profile: No results for input(s): INR, PROTIME in the last 168 hours. Cardiac Enzymes: No results for input(s): CKTOTAL, CKMB, CKMBINDEX, TROPONINI in the last 168 hours. BNP (last 3 results) No results for input(s): PROBNP in the last 8760 hours. HbA1C: No results for input(s): HGBA1C in the last 72 hours. CBG: No results for input(s): GLUCAP in the last 168 hours. Lipid Profile: No results for input(s): CHOL, HDL, LDLCALC, TRIG, CHOLHDL, LDLDIRECT in the last 72 hours. Thyroid Function Tests: No results for input(s): TSH, T4TOTAL, FREET4, T3FREE, THYROIDAB in the last 72 hours. Anemia Panel: No results for input(s): VITAMINB12, FOLATE, FERRITIN, TIBC, IRON, RETICCTPCT in the last 72 hours. Urine analysis:    Component Value Date/Time   COLORURINE YELLOW 11/09/2016 2220   APPEARANCEUR CLOUDY (A) 11/09/2016 2220   LABSPEC 1.025 11/09/2016 2220   PHURINE 6.0 11/09/2016 2220   GLUCOSEU NEGATIVE 11/09/2016 2220   HGBUR  NEGATIVE 11/09/2016 2220   BILIRUBINUR n 03/08/2019 1241   KETONESUR NEGATIVE 11/09/2016 2220   PROTEINUR Negative 03/08/2019 1241   PROTEINUR NEGATIVE 11/09/2016 2220   UROBILINOGEN 0.2 03/08/2019 1241   NITRITE n 03/08/2019 1241   NITRITE NEGATIVE 11/09/2016 2220   LEUKOCYTESUR Negative 03/08/2019 1241    Radiological Exams on Admission: CT ABDOMEN PELVIS WO CONTRAST  Result Date: 02/21/2020 CLINICAL DATA:  Constipation for 1 week, abdominal cramping, nausea EXAM: CT ABDOMEN AND PELVIS WITHOUT CONTRAST TECHNIQUE: Multidetector CT imaging of the abdomen and pelvis was performed following the standard protocol without IV contrast. COMPARISON:  11/09/2016 FINDINGS: Lower chest: No acute pleural or parenchymal lung disease. Hepatobiliary: No focal liver abnormality is seen. Status post cholecystectomy. No biliary dilatation. Pancreas: Unremarkable. No pancreatic ductal dilatation or surrounding inflammatory changes. Spleen: Normal in size without focal abnormality. Adrenals/Urinary Tract: No urinary tract calculi or obstructive uropathy. The adrenals are normal. Evaluation of bladder is limited due to streak artifact from bilateral hip arthroplasties. Stomach/Bowel: Prominent diverticulosis of the sigmoid colon. Mild pericolonic fat stranding at the rectosigmoid junction may reflect acute uncomplicated diverticulitis. No bowel obstruction or ileus. Vascular/Lymphatic: Aortic atherosclerosis. No enlarged abdominal or pelvic lymph nodes. Reproductive: Status post hysterectomy. No adnexal masses. Other: No abdominal wall hernia or abnormality. No abdominopelvic ascites. Musculoskeletal: Bilateral hip arthroplasties are noted. No acute or destructive bony lesions. Reconstructed images demonstrate no additional findings. IMPRESSION: 1. Mild acute diverticulitis at the junction of the rectosigmoid colon. No perforation, fluid collection, or abscess. 2.  Aortic Atherosclerosis (ICD10-I70.0). Electronically  Signed   By: Randa Ngo M.D.   On: 02/21/2020 18:20    EKG: Independently reviewed.  Atrial fibrillation with RVR rate 134.  Prior EKG from 11/14/2019 showed normal sinus rhythm.  Assessment/Plan Principal Problem:   Diverticulitis of sigmoid colon Active Problems:   Type  2 diabetes mellitus with diabetic nephropathy, with long-term current use of insulin (HCC)   Paroxysmal atrial fibrillation (HCC)   Hyponatremia   Hypothyroidism   Hypertension associated with diabetes ([place])   COPD (chronic obstructive pulmonary disease) ([place])  Jeanette Mcintosh is a 79 y.o. female with medical history significant for paroxysmal A. fib/flutter on aspirin alone, COPD, insulin-dependent type 2 diabetes, hypertension, hypothyroidism, and anxiety who is admitted with sigmoid diverticulitis.  Sigmoid diverticulitis: Seen on CT imaging without evidence of perforation, fluid collection, or abscess. -Continue IV ceftriaxone and Flagyl -Continue IV fluid hydration overnight -Continue analgesics and antiemetics as needed  Paroxysmal A. Fib/flutter: CHA2DS2-VASc score 5.  Follows with cardiology, Dr. Einar Gip.  She is on aspirin alone per her preference according to prior cardiology notes.  Had transient A. fib with RVR in the ED before spontaneously converting to sinus rhythm. -Continue Lopressor -Continue aspirin 81 mg daily  Hyponatremia: In the setting of decreased oral intake however may be chronic as well. -Continue IV fluid hydration and repeat labs in a.m. -Hold home HCTZ  Insulin-dependent type 2 diabetes: Continue home Lantus at 8 units nightly, add sensitive SSI.  Hypertension: Currently stable.  Continue losartan and Lopressor.  COPD: Chronic and stable.  Continued Dulera and as needed albuterol.  Hypothyroidism: Continue Synthroid.  Anxiety: Continue home clonazepam.  Recurrent UTI: On daily prophylactic nitrofurantoin by her urologist per patient.  Continue nitrofurantoin.  DVT  prophylaxis: Lovenox Code Status: DNR, confirmed with patient Family Communication: Discussed with patient's acquaintance at bedside Disposition Plan: From home and likely discharge to home pending symptomatic improvement and ability to transition to oral antibiotics. Consults called: None Admission status:  Status is: Observation  The patient remains OBS appropriate and will d/c before 2 midnights.  Dispo: The patient is from: Home              Anticipated d/c is to: Home              Anticipated d/c date is: 1 day pending symptomatic improvement and ability to transition to oral antibiotics.              Patient currently is not medically stable to d/c.    Zada Finders MD [facility]  If 7PM-7AM, please contact night-coverage [email or web address]  02/21/2020, 8:53 PM

## 2020-02-21 NOTE — ED Provider Notes (Signed)
[facility] DEPT Provider Note   CSN: [id number] Arrival date & time: 02/21/20  1614    History Chief Complaint  Patient presents with  . Constipation  . Abdominal Pain    Jeanette Mcintosh is a 79 y.o. female with past medical history significant for diabetes, IBS, PAF disease, COPD who presents for evaluation abdominal pain.  Patient with generalized abdominal pain however worse to her lower abdomen x5 days.  States she had small bowel movement on Sunday which had mucus and light brown stool.  Patient states initially she was passing gas however she is not passing gas as much as she "normally is."  Has had persistent nausea.  Emesis just PTA.  Emesis NBNB.  She denies any melena or bright red blood per rectum.  She admits to difficulty with urination however states this is chronic in nature all due to cystocele.  Prior abdominal surgeries include cholecystectomy and hysterectomy.  No prior bowel obstructions.  Denies additional aggravating or relieving factors.  Was seen by PCP yesterday and told to start MiraLAX and milk of magnesia.  She has not started the MiraLAX.  She is followed by Dr. Epifania Gore with GI.  Has history of diverticulosis however is not had diverticulitis previously.  She denies additional aggravating relieving factors.  No fever, chills, chest pain, shortness of breath, rashes, lesions.  Denies additional aggravating or relieving factors.  History obtained from patient and past medical records.  No interpreter is used.  HPI     Past Medical History:  Diagnosis Date  . Acute kidney injury superimposed on chronic kidney disease ([place]) 11/09/2016  . Arthritis   . Asthma   . Blood transfusion without reported diagnosis   . Cancer (HCC)    Cervical Cancer   . Cataract   . COPD (chronic obstructive pulmonary disease) ([place])   . Eczema   . GERD (gastroesophageal reflux disease)   . Hyperlipidemia   . Hypertension   . IDDM (insulin  dependent diabetes mellitus)   . Irritable bowel syndrome   . Macular degeneration   . PAF (paroxysmal atrial fibrillation) ([place]) 2007  . Paroxysmal atrial fibrillation ([place]) 11/09/2016  . Paroxysmal atrial flutter ([place])   . Thyroid disease     Patient Active Problem List   Diagnosis Date Noted  . Diverticulitis of sigmoid colon 02/21/2020  . Hypothyroidism   . Paroxysmal atrial flutter ([place])   . Macular degeneration   . Hyperlipidemia   . Hypertension associated with diabetes ([place])   . COPD (chronic obstructive pulmonary disease) ([place])   . Nontraumatic subluxation of extensor tendon at MCP joint of hand, right 04/18/2018  . Sepsis ([place]) 11/09/2016  . Type 2 diabetes mellitus with diabetic nephropathy, with long-term current use of insulin ([place]) 11/09/2016  . Essential hypertension 11/09/2016  . Paroxysmal atrial fibrillation ([place]) 11/09/2016  . Hyponatremia 11/09/2016  . Elevated troponin 11/09/2016  . Influenza 11/09/2016  . Acute kidney injury superimposed on chronic kidney disease ([place]) 11/09/2016  . IRRITABLE BOWEL SYNDROME 06/17/2008  . CHOLELITHIASIS, HX OF 06/17/2008  . DIVERTICULOSIS-COLON 05/31/2008  . GERD 05/06/2008  . DIARRHEA 05/06/2008    Past Surgical History:  Procedure Laterality Date  . ABDOMINAL HYSTERECTOMY  1971   Partial  . APPENDECTOMY  1971  . CATARACT EXTRACTION  2002  . CHOLECYSTECTOMY, LAPAROSCOPIC  2011  . HEMORRHOID SURGERY  1990's   x2  . renal doppler  12/12/06  . TOTAL HIP ARTHROPLASTY  2012   Left  .  TOTAL HIP ARTHROPLASTY  2002   Right  . TRIGGER FINGER RELEASE  1990's   Several surgeries.  . US ECHOCARDIOGRAPHY  01/20/12     OB History   No obstetric history on file.     Family History  Problem Relation Age of Onset  . Basal cell carcinoma Father   . Dementia Father   . Parkinson's disease Father   . Cancer Other        All (5) had Hysterectomies and high dose Estrogen in 1950's were all diagnosed with Breast Cancer.  .  Glaucoma Mother   . Allergic rhinitis Mother   . Parkinson's disease Paternal Grandmother   . Eczema Son     Social History   Tobacco Use  . Smoking status: Former Smoker    Packs/day: 0.25    Years: 25.00    Pack years: 6.25    Types: Cigarettes    Start date: 09/14/1950    Quit date: 09/13/2013    Years since quitting: 6.4  . Smokeless tobacco: Never Used  . Tobacco comment: Pt has stopped and started smoking muliple times over the years.  Amount varied during the years.  Vaping Use  . Vaping Use: Never used  Substance Use Topics  . Alcohol use: Yes    Alcohol/week: 0.0 standard drinks    Comment: 1/Evening  . Drug use: No    Home Medications Prior to Admission medications   Medication Sig Start Date End Date Taking? Authorizing Provider  aspirin EC 81 MG tablet Take 81 mg by mouth daily.   Yes [provider]  b complex vitamins tablet Take 1 tablet by mouth daily.   Yes [provider]  carboxymethylcellulose (REFRESH PLUS) 0.5 % SOLN Place 1 drop into both eyes 3 (three) times daily as needed (dry eyes).   Yes [provider]  cetirizine (ZYRTEC) 10 MG tablet Take 10 mg by mouth daily.   Yes [provider]  Cholecalciferol (VITAMIN D PO) Take 4,000 Units by mouth daily.    Yes [provider]  clonazePAM (KLONOPIN) 1 MG tablet TAKE 1/2 TO 3/4 TABLET AT NIGHT BEFORE BEDTIME FOR ANXIETY AND SLEEP Patient taking differently: Take 0.5 mg by mouth at bedtime.  10/30/19  Yes Nafziger, Tommi Rumps, NP  estrogens, conjugated, (PREMARIN) 0.3 MG tablet Take 0.3 mg by mouth every Monday, Wednesday, and Friday. Take daily for 21 days then do not take for 7 days.   Yes [provider]  FOLIC ACID PO Take 973 mcg by mouth daily.    Yes [provider]  guaiFENesin (MUCINEX) 600 MG 12 hr tablet Take 1,200 mg by mouth 2 (two) times daily.   Yes [provider]  hydrochlorothiazide (HYDRODIURIL) 25 MG tablet Take 25 mg by mouth  daily.   Yes [provider]  latanoprost (XALATAN) 0.005 % ophthalmic solution Place 1 drop into both eyes at bedtime.  10/13/12  Yes [provider]  levothyroxine (SYNTHROID) 125 MCG tablet Take 1 tablet (125 mcg total) by mouth daily before breakfast. 05/01/19  Yes Nafziger, Tommi Rumps, NP  losartan (COZAAR) 100 MG tablet Take 1 tablet (100 mg total) by mouth daily. 05/01/19  Yes Nafziger, Tommi Rumps, NP  meloxicam (MOBIC) 7.5 MG tablet Take 1 tablet (7.5 mg total) by mouth daily. 05/01/19  Yes Nafziger, Tommi Rumps, NP  metoprolol tartrate (LOPRESSOR) 25 MG tablet TAKE 1 AND 1/2 TABLETS(37.5 MG) BY MOUTH TWICE DAILY Patient taking differently: Take 37.5 mg by mouth 2 (two) times daily.  12/10/19  Yes Adrian Prows, MD  nitrofurantoin (MACRODANTIN) 100 MG capsule Take 100 mg by mouth daily. 01/23/20  Yes [provider]  NON FORMULARY Take 11 g by mouth daily. [place]   Yes [provider]  oxymetazoline (AFRIN) 0.05 % nasal spray Place 1 spray into both nostrils 2 (two) times daily as needed for congestion.   Yes [provider]  PREBIOTIC PRODUCT PO Take 1 tablet by mouth in the morning and at bedtime. 16 Strain    Yes [provider]  RUTIN PO Take 500 mg by mouth daily.    Yes [provider]  TURMERIC PO Take 1,500 mg by mouth in the morning and at bedtime. With Black Pepper and Boswellia   Yes [provider]  VITAMIN A PO Take 10,000 Units by mouth daily.    Yes [provider]  vitamin C (ASCORBIC ACID) 250 MG tablet Take 250 mg by mouth daily.   Yes [provider]  albuterol (PROAIR HFA) 108 (90 Base) MCG/ACT inhaler Inhale 2 puffs into the lungs every 4 (four) hours as needed for wheezing or shortness of breath. Patient not taking: Reported on 02/21/2020 03/18/16   Gean Quint, MD  albuterol (PROVENTIL) (2.5 MG/3ML) 0.083% nebulizer solution Take 2.5 mg by nebulization every 6 (six) hours as needed for wheezing or  shortness of breath. Patient not taking: Reported on 02/21/2020    [provider]  benzonatate (TESSALON) 100 MG capsule Take 100 mg by mouth as needed.  Patient not taking: Reported on 02/21/2020 07/21/15   [provider]  budesonide-formoterol (SYMBICORT) 160-4.5 MCG/ACT inhaler Inhale 2 puffs into the lungs 2 (two) times daily. Patient not taking: Reported on 02/21/2020 07/31/19   Dorothyann Peng, NP  glucose blood (ONETOUCH VERIO) test strip Use to test blood glucose three times daily. 05/08/19   Nafziger, Tommi Rumps, NP  hydrochlorothiazide (HYDRODIURIL) 12.5 MG tablet Take 1 tablet (12.5 mg total) by mouth daily. Patient not taking: Reported on 02/21/2020 06/15/19   Adrian Prows, MD  insulin glargine (LANTUS) 100 UNIT/ML injection USE 12-20 UNITS DAILY AS DIRECTED Patient taking differently: Inject 8-20 Units into the skin at bedtime. Takes only if blood sugar more than 150. 07/31/19   Nafziger, Tommi Rumps, NP  Insulin Syringe-Needle U-100 (BD INSULIN SYRINGE ULTRAFINE) 31G X 15/64" 0.3 ML MISC USE TO INJECT 12-20 UNITS OF INSULIN DAILY. 07/31/19   Nafziger, Tommi Rumps, NP    Allergies    Morphine and related, Phenergan [promethazine hcl], Ambien [zolpidem], Dicyclomine, Diphenhydramine, Erythromycin, Ivp dye [iodinated diagnostic agents], Ondansetron, Propoxyphene, Shellfish allergy, Statins, and Vitamin e  Review of Systems   Review of Systems  Constitutional: Negative.   HENT: Negative.   Respiratory: Negative.   Cardiovascular: Negative.   Gastrointestinal: Positive for abdominal pain, constipation, diarrhea, nausea and vomiting. Negative for abdominal distention, anal bleeding, blood in stool and rectal pain.  Genitourinary: Negative.   Musculoskeletal: Negative.   Skin: Negative.   Neurological: Negative.   All other systems reviewed and are negative.   Physical Exam Updated Vital Signs BP 135/73   Pulse 80   Temp 98.2 F (36.8 C) (Oral)   Resp 14   SpO2 98%   Physical  Exam Vitals and nursing note reviewed.  Constitutional:      General: She is not in acute distress.    Appearance: She is well-developed. She is not ill-appearing, toxic-appearing or diaphoretic.  HENT:     Head: Normocephalic and atraumatic.     Mouth/Throat:  Mouth: Mucous membranes are moist.  Eyes:     Pupils: Pupils are equal, round, and reactive to light.  Cardiovascular:     Rate and Rhythm: Normal rate.     Heart sounds: Normal heart sounds.  Pulmonary:     Effort: Pulmonary effort is normal. No respiratory distress.     Breath sounds: Normal breath sounds.  Abdominal:     General: There is no distension.     Tenderness: There is abdominal tenderness in the periumbilical area, suprapubic area and left lower quadrant. There is no guarding or rebound. Negative signs include Murphy's sign and McBurney's sign.     Hernia: No hernia is present.  Genitourinary:    Comments: Declined rectal exam Musculoskeletal:        General: Normal range of motion.     Cervical back: Normal range of motion.     Comments: Moves all 4 extremities without difficulty.  No bony tenderness.  Compartments soft.  Tactile temperature to extremities.  Skin:    General: Skin is warm and dry.     Capillary Refill: Capillary refill takes less than 2 seconds.     Comments: Brisk cap refill. No edema, erythema, warmth  Neurological:     Mental Status: She is alert.     Cranial Nerves: Cranial nerves are intact.     Sensory: Sensation is intact.     Motor: Motor function is intact.     Coordination: Coordination is intact.     Gait: Gait is intact.     Comments: Ambulatory without difficulty    ED Results / Procedures / Treatments   Labs (all labs ordered are listed, but only abnormal results are displayed) Labs Reviewed  COMPREHENSIVE METABOLIC PANEL - Abnormal; Notable for the following components:      Result Value   Sodium 127 (*)    Chloride 88 (*)    Glucose, Bld 161 (*)    All other  components within normal limits  CBC - Abnormal; Notable for the following components:   WBC 13.6 (*)    RDW 11.4 (*)    All other components within normal limits  SARS CORONAVIRUS 2 BY RT PCR (HOSPITAL ORDER, [facility])  LIPASE, BLOOD  URINALYSIS, ROUTINE W REFLEX MICROSCOPIC    EKG None  Radiology CT ABDOMEN PELVIS WO CONTRAST  Result Date: 02/21/2020 CLINICAL DATA:  Constipation for 1 week, abdominal cramping, nausea EXAM: CT ABDOMEN AND PELVIS WITHOUT CONTRAST TECHNIQUE: Multidetector CT imaging of the abdomen and pelvis was performed following the standard protocol without IV contrast. COMPARISON:  11/09/2016 FINDINGS: Lower chest: No acute pleural or parenchymal lung disease. Hepatobiliary: No focal liver abnormality is seen. Status post cholecystectomy. No biliary dilatation. Pancreas: Unremarkable. No pancreatic ductal dilatation or surrounding inflammatory changes. Spleen: Normal in size without focal abnormality. Adrenals/Urinary Tract: No urinary tract calculi or obstructive uropathy. The adrenals are normal. Evaluation of bladder is limited due to streak artifact from bilateral hip arthroplasties. Stomach/Bowel: Prominent diverticulosis of the sigmoid colon. Mild pericolonic fat stranding at the rectosigmoid junction may reflect acute uncomplicated diverticulitis. No bowel obstruction or ileus. Vascular/Lymphatic: Aortic atherosclerosis. No enlarged abdominal or pelvic lymph nodes. Reproductive: Status post hysterectomy. No adnexal masses. Other: No abdominal wall hernia or abnormality. No abdominopelvic ascites. Musculoskeletal: Bilateral hip arthroplasties are noted. No acute or destructive bony lesions. Reconstructed images demonstrate no additional findings. IMPRESSION: 1. Mild acute diverticulitis at the junction of the rectosigmoid colon. No perforation, fluid collection, or  abscess. 2.  Aortic Atherosclerosis (ICD10-I70.0). Electronically Signed   By:  Randa Ngo M.D.   On: 02/21/2020 18:20    Procedures Procedures (including critical care time)  Medications Ordered in ED Medications  metoprolol tartrate (LOPRESSOR) tablet 37.5 mg (has no administration in time range)  sodium chloride 0.9 % bolus 1,000 mL (0 mLs Intravenous Stopped 02/21/20 1933)  fentaNYL (SUBLIMAZE) injection 25 mcg (25 mcg Intravenous Given 02/21/20 1918)  cefTRIAXone (ROCEPHIN) 2 g in sodium chloride 0.9 % 100 mL IVPB (0 g Intravenous Stopped 02/21/20 1920)    And  metroNIDAZOLE (FLAGYL) IVPB 500 mg (0 mg Intravenous Stopped 02/21/20 2007)  sodium chloride 0.9 % bolus 1,000 mL (1,000 mLs Intravenous New Bag/Given (Non-Interop) 02/21/20 2041)  fentaNYL (SUBLIMAZE) injection 50 mcg (50 mcg Intravenous Given 02/21/20 2042)   ED Course  I have reviewed the triage vital signs and the nursing notes.  Pertinent labs & imaging results that were available during my care of the patient were reviewed by me and considered in my medical decision making (see chart for details).  79 year old presents for evaluation of abdominal pain, emesis. She is afebrile, nonseptic, non-ill-appearing. Concern for bowel obstruction by PCP and sent here to the emergency department. Has had some constipation over the last 5 days. NBNB emesis just PTA. She appears overall well. She is some generalized tenderness to her lower abdomen. Heart and lungs clear. She has stable vital signs.  No chest pain, shortness of breath.  Patient declines GU exam to assess for impaction.  Plan on labs, imaging and reassess  Labs and imaging personally viewed and interpreted: CBC with leukocytosis at 22.0 Metabolic panel with mild hyponatremia to 127 however consistent with prior labs, getting IV fluids, hyperglycemia to 166 Lipase 32 CT abdomen pelvis without contrast 2/2 contrast allergy with mild acute diverticulitis.  1800: Patient reassessed. States she does not want any pain medication or antiemetics at this  time. States she did have a loose bowel movement in her briefs. Pending CT AP.  1900: Patient reassessed.  Requesting pain medication however requests a "low-dose."  She does not needed any antiemetics and does not want antiemetics at this time given she has had prior allergic reactions with these.  2045: Patient reassessed.  Requesting additional pain medicine.  She began into A. fib with RVR.  She has history of this.  She has not taken her home metoprolol.  I did put a small dose of IV metoprolol in however patient self converted prior to administration.  We will give her her home meds.  Will admit for pain control for diverticulitis as well as IV antibiotics.  Patient does not appear septic.  She has had no emesis here in the emergency department.  She has been having bowel movements.  Patient does not meet the SIRS or Sepsis criteria.  On repeat exam patient does not have a surgical abdomin and there are no peritoneal signs.  No indication of appendicitis, bowel obstruction, bowel perforation, cholecystitis.    Discussed with attending, Dr. Billy Fischer who agrees with the treatment, plan and disposition  The patient appears reasonably stabilized for admission considering the current resources, flow, and capabilities available in the ED at this time, and I doubt any other [facility] requiring further screening and/or treatment in the ED prior to admission.    MDM Rules/Calculators/A&P  Final Clinical Impression(s) / ED Diagnoses Final diagnoses:  Diverticulitis  Atrial fibrillation with RVR ([place])  Hyponatremia    Rx / DC Orders ED Discharge Orders    None       Shannie Kontos A, PA-C 02/21/20 2059    Gareth Morgan, MD 02/22/20 1016

## 2020-02-21 NOTE — ED Notes (Signed)
Pt transported to CT ?

## 2020-02-21 NOTE — ED Triage Notes (Signed)
Received call from [facility]. Pt was seen yesterday and complained of constipation, but denied any nausea or vomitting. Pt also stated she was passing gas. She was then instructed to use milk of magnesia and miralax. However, today the patient reports having emesis. Pt has not had a BM in 5 days. Pt complains of severe abdominal pain. PCP is concerned about a possible blockage.

## 2020-02-22 ENCOUNTER — Other Ambulatory Visit: Payer: Self-pay

## 2020-02-22 DIAGNOSIS — I4892 Unspecified atrial flutter: Secondary | ICD-10-CM | POA: Diagnosis present

## 2020-02-22 DIAGNOSIS — Z792 Long term (current) use of antibiotics: Secondary | ICD-10-CM | POA: Diagnosis not present

## 2020-02-22 DIAGNOSIS — K5792 Diverticulitis of intestine, part unspecified, without perforation or abscess without bleeding: Secondary | ICD-10-CM | POA: Diagnosis present

## 2020-02-22 DIAGNOSIS — J449 Chronic obstructive pulmonary disease, unspecified: Secondary | ICD-10-CM | POA: Diagnosis present

## 2020-02-22 DIAGNOSIS — Z8541 Personal history of malignant neoplasm of cervix uteri: Secondary | ICD-10-CM | POA: Diagnosis not present

## 2020-02-22 DIAGNOSIS — Z66 Do not resuscitate: Secondary | ICD-10-CM | POA: Diagnosis present

## 2020-02-22 DIAGNOSIS — Z87891 Personal history of nicotine dependence: Secondary | ICD-10-CM | POA: Diagnosis not present

## 2020-02-22 DIAGNOSIS — K5732 Diverticulitis of large intestine without perforation or abscess without bleeding: Secondary | ICD-10-CM | POA: Diagnosis present

## 2020-02-22 DIAGNOSIS — Z794 Long term (current) use of insulin: Secondary | ICD-10-CM | POA: Diagnosis not present

## 2020-02-22 DIAGNOSIS — F419 Anxiety disorder, unspecified: Secondary | ICD-10-CM | POA: Diagnosis present

## 2020-02-22 DIAGNOSIS — I480 Paroxysmal atrial fibrillation: Secondary | ICD-10-CM | POA: Diagnosis present

## 2020-02-22 DIAGNOSIS — I152 Hypertension secondary to endocrine disorders: Secondary | ICD-10-CM | POA: Diagnosis present

## 2020-02-22 DIAGNOSIS — E1169 Type 2 diabetes mellitus with other specified complication: Secondary | ICD-10-CM | POA: Diagnosis present

## 2020-02-22 DIAGNOSIS — K5900 Constipation, unspecified: Secondary | ICD-10-CM | POA: Diagnosis present

## 2020-02-22 DIAGNOSIS — H3530 Unspecified macular degeneration: Secondary | ICD-10-CM | POA: Diagnosis present

## 2020-02-22 DIAGNOSIS — Z885 Allergy status to narcotic agent status: Secondary | ICD-10-CM | POA: Diagnosis not present

## 2020-02-22 DIAGNOSIS — Z7982 Long term (current) use of aspirin: Secondary | ICD-10-CM | POA: Diagnosis not present

## 2020-02-22 DIAGNOSIS — Z96643 Presence of artificial hip joint, bilateral: Secondary | ICD-10-CM | POA: Diagnosis present

## 2020-02-22 DIAGNOSIS — Z87440 Personal history of urinary (tract) infections: Secondary | ICD-10-CM | POA: Diagnosis not present

## 2020-02-22 DIAGNOSIS — E785 Hyperlipidemia, unspecified: Secondary | ICD-10-CM | POA: Diagnosis present

## 2020-02-22 DIAGNOSIS — K219 Gastro-esophageal reflux disease without esophagitis: Secondary | ICD-10-CM | POA: Diagnosis present

## 2020-02-22 DIAGNOSIS — E871 Hypo-osmolality and hyponatremia: Secondary | ICD-10-CM | POA: Diagnosis present

## 2020-02-22 DIAGNOSIS — Z20822 Contact with and (suspected) exposure to covid-19: Secondary | ICD-10-CM | POA: Diagnosis present

## 2020-02-22 DIAGNOSIS — E039 Hypothyroidism, unspecified: Secondary | ICD-10-CM | POA: Diagnosis present

## 2020-02-22 DIAGNOSIS — Z888 Allergy status to other drugs, medicaments and biological substances status: Secondary | ICD-10-CM | POA: Diagnosis not present

## 2020-02-22 LAB — BASIC METABOLIC PANEL
Anion gap: 11 (ref 5–15)
BUN: 14 mg/dL (ref 8–23)
CO2: 24 mmol/L (ref 22–32)
Calcium: 8.1 mg/dL — ABNORMAL LOW (ref 8.9–10.3)
Chloride: 96 mmol/L — ABNORMAL LOW (ref 98–111)
Creatinine, Ser: 0.97 mg/dL (ref 0.44–1.00)
GFR calc Af Amer: >60 mL/min (ref >60)
GFR calc non Af Amer: 56 mL/min — ABNORMAL LOW (ref >60)
Glucose, Bld: 124 mg/dL — ABNORMAL HIGH (ref 70–99)
Potassium: 3.6 mmol/L (ref 3.5–5.1)
Sodium: 131 mmol/L — ABNORMAL LOW (ref 135–145)

## 2020-02-22 LAB — CBC
HCT: 33.1 % — ABNORMAL LOW (ref 36.0–46.0)
Hemoglobin: 11.5 g/dL — ABNORMAL LOW (ref 12.0–15.0)
MCH: 32.3 pg (ref 26.0–34.0)
MCHC: 34.7 g/dL (ref 30.0–36.0)
MCV: 93 fL (ref 80.0–100.0)
Platelets: 261 10*3/uL (ref 150–400)
RBC: 3.56 MIL/uL — ABNORMAL LOW (ref 3.87–5.11)
RDW: 11.6 % (ref 11.5–15.5)
WBC: 8.4 10*3/uL (ref 4.0–10.5)
nRBC: 0 % (ref 0.0–0.2)

## 2020-02-22 LAB — OSMOLALITY, URINE: Osmolality, Ur: 134 mOsm/kg — ABNORMAL LOW (ref 300–900)

## 2020-02-22 LAB — GLUCOSE, CAPILLARY
Glucose-Capillary: 120 mg/dL — ABNORMAL HIGH (ref 70–99)
Glucose-Capillary: 100 mg/dL — ABNORMAL HIGH (ref 70–99)
Glucose-Capillary: 113 mg/dL — ABNORMAL HIGH (ref 70–99)
Glucose-Capillary: 132 mg/dL — ABNORMAL HIGH (ref 70–99)

## 2020-02-22 LAB — OSMOLALITY: Osmolality: 279 mOsm/kg (ref 275–295)

## 2020-02-22 LAB — HEMOGLOBIN A1C
Hgb A1c MFr Bld: 6.6 % — ABNORMAL HIGH (ref 4.8–5.6)
Mean Plasma Glucose: 142.72 mg/dL

## 2020-02-22 MED ORDER — SACCHAROMYCES BOULARDII 250 MG PO CAPS
250.0000 mg | ORAL_CAPSULE | Freq: Two times a day (BID) | ORAL | Status: DC
  Administered 2020-02-22 – 2020-02-23 (×2): 250 mg via ORAL
  Filled 2020-02-22 (×2): qty 1

## 2020-02-22 MED ORDER — AMOXICILLIN-POT CLAVULANATE 875-125 MG PO TABS
1.0000 | ORAL_TABLET | Freq: Two times a day (BID) | ORAL | Status: DC
  Administered 2020-02-22: 1 via ORAL
  Filled 2020-02-22 (×2): qty 1

## 2020-02-22 NOTE — Progress Notes (Signed)
PROGRESS NOTE    Jeanette Mcintosh    Code Status: DNR  TWS:[id number] DOB: 21-Mar-1941 DOA: 02/21/2020 LOS: 0 days  PCP: Buzzy Han, MD CC:  Chief Complaint  Patient presents with  . Constipation  . Abdominal Pain       Hospital Summary   This is a 79 year old female with history of paroxysmal atrial fibrillation/flutter on aspirin alone per patient preference, COPD, insulin-dependent type 2 diabetes, hypertension, hypothyroidism, anxiety, diverticulosis who presented with abdominal pain with left lower quadrant pain and constipation over the past 6 days.  Began taking laxatives after discussion with PCP 2 days prior to admission due to constipation however had worsening abdominal pain with nausea and emesis following. ED Course:  Initial vitals showed BP 126/78, pulse 94, RR 18, temp 98.2 Fahrenheit, SPO2 96% on room air.  Patient did go into A. fib with RVR, rate high 130s to low 140s before spontaneously converting back to normal sinus rhythm. Labs are notable for sodium 127, potassium 3.7, chloride 88, bicarb 24, BUN 14, creatinine 0.9, serum glucose 161, LFTs within normal limits, lipase 32, WBC 13.6, hemoglobin 14.6, platelets 376,000.CT abdomen/pelvis without contrast shows mild acute diverticulitis at the rectosigmoid colon junction without evidence of perforation, fluid collection, or abscess. Patient was given 2 L normal saline, IV ceftriaxone and Flagyl, and admitted for diverticulitis.   A & P   Principal Problem:   Diverticulitis of sigmoid colon Active Problems:   Type 2 diabetes mellitus with diabetic nephropathy, with long-term current use of insulin (HCC)   Paroxysmal atrial fibrillation (HCC)   Hyponatremia   Hypothyroidism   Hypertension associated with diabetes (HCC)   COPD (chronic obstructive pulmonary disease) (HCC)   Diverticulitis   1. Sigmoid diverticulitis a. No evidence of perforation, fluid collection or abscess b. Continue ceftriaxone  and Flagyl for now c. Advance to clear liquid diet d. Continue analgesics and antiemetics as needed  2. Paroxysmal atrial fibrillation/flutter a. Episode of RVR in the ED which spontaneously converted to sinus rhythm b. CHA2DS2-VASc: 5.  Follows with cardiology and is on aspirin monotherapy per patient preference c. Continue Lopressor d. Continue aspirin  3. Hyponatremia, likely acute on chronic a. Probably from decreased p.o. intake b. Improved with IV fluids and holding HCTZ c. Continue IV fluids and advance diet as tolerated  4. Insulin-dependent type 2 diabetes a. Continue home Lantus 8 units nightly and sliding scale  5. Hypertension, stable a. Continue losartan and Lopressor  6. COPD, stable a. Continue Dulera and as needed albuterol  7. Hypothyroidism on Synthroid  8. Anxiety on clonazepam  9. 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          Pressure injury documentation    None  Consultants  None  Procedures  None  Antibiotics   Anti-infectives (From admission, onward)   Start     Dose/Rate Route Frequency Ordered Stop   02/22/20 1800  cefTRIAXone (ROCEPHIN) 2 g in sodium chloride 0.9 % 100 mL IVPB     Discontinue    "And" Linked Group Details   2 g 200 mL/hr over 30 Minutes Intravenous Every 24 hours 02/21/20 2204     02/22/20 1000  nitrofurantoin (MACRODANTIN) capsule 100 mg     Discontinue     100 mg Oral Daily 02/21/20 2202     02/22/20 0200  metroNIDAZOLE (FLAGYL) IVPB 500 mg     Discontinue    "And" Linked Group  Details   500 mg 100 mL/hr over 60 Minutes Intravenous Every 8 hours 02/21/20 2204     02/21/20 1830  cefTRIAXone (ROCEPHIN) 2 g in sodium chloride 0.9 % 100 mL IVPB       "And" Linked Group Details   2 g 200 mL/hr over 30 Minutes Intravenous  Once 02/21/20 1825 02/21/20 1920   02/21/20 1830  metroNIDAZOLE (FLAGYL) IVPB 500 mg       "And" Linked Group Details   500 mg 100 mL/hr over 60 Minutes Intravenous  Once 02/21/20 1825 02/21/20 2007        Subjective   Patient seen and examined at bedside in no acute distress and resting comfortably. No acute events overnight. Denies any acute complaints at this time.  States improved symptoms and is requesting to advance her diet to clear liquids.  No other issues.  Objective   Vitals:   02/22/20 0623 02/22/20 1045 02/22/20 1127 02/22/20 1404  BP: (!) 93/39 136/62 (!) 108/58 (!) 107/55  Pulse: 69 64 (!) 59 65  Resp: 16 16  16   Temp: 98.5 F (36.9 C) 98.1 F (36.7 C)  98.2 F (36.8 C)  TempSrc:  Oral  Oral  SpO2: 96% 96%  97%    Intake/Output Summary (Last 24 hours) at 02/22/2020 1558 Last data filed at 02/22/2020 1100 Gross per 24 hour  Intake 1260 ml  Output 500 ml  Net 760 ml   There were no vitals filed for this visit.  Examination:  Physical Exam Vitals and nursing note reviewed.  Constitutional:      Appearance: Normal appearance.  HENT:     Head: Normocephalic and atraumatic.  Eyes:     Conjunctiva/sclera: Conjunctivae normal.  Cardiovascular:     Rate and Rhythm: Normal rate and regular rhythm.  Pulmonary:     Effort: Pulmonary effort is normal.     Breath sounds: Normal breath sounds.  Abdominal:     General: Abdomen is flat.     Palpations: Abdomen is soft.     Tenderness: There is abdominal tenderness in the left lower quadrant.  Musculoskeletal:        General: No swelling or tenderness.  Skin:    Coloration: Skin is not jaundiced or pale.  Neurological:     Mental Status: She is alert. Mental status  is at baseline.  Psychiatric:        Mood and Affect: Mood normal.        Behavior: Behavior normal.     Data Reviewed: I have personally reviewed following labs and imaging studies  CBC: Recent Labs  Lab 02/21/20 1638 02/22/20 0342  WBC 13.6* 8.4  HGB 14.6 11.5*  HCT 40.9 33.1*  MCV 90.7 93.0  PLT 376 448   Basic Metabolic Panel: Recent Labs  Lab 02/21/20 1638 02/22/20 0342  NA 127* 131*  K 3.7 3.6  CL 88* 96*  CO2 24 24  GLUCOSE 161* 124*  BUN 14 14  CREATININE 0.90 0.97  CALCIUM 9.3 8.1*   GFR: CrCl cannot be calculated (Unknown ideal weight.). Liver Function Tests: Recent Labs  Lab 02/21/20 1638  AST 23  ALT 18  ALKPHOS 78  BILITOT 1.1  PROT 7.7  ALBUMIN 4.7   Recent Labs  Lab 02/21/20 1638  LIPASE 32   No results for input(s): AMMONIA in the last 168 hours. Coagulation Profile: No results for input(s): INR, PROTIME in the last 168 hours. Cardiac Enzymes: No results for input(s): CKTOTAL, CKMB, CKMBINDEX, TROPONINI in the last 168 hours. BNP (last 3 results) No results for input(s): PROBNP in the last 8760 hours. HbA1C: Recent Labs    02/21/20 1638  HGBA1C 6.6*   CBG: Recent Labs  Lab 02/21/20 2239 02/22/20 0743 02/22/20 1147  GLUCAP 136* 120* 100*   Lipid Profile: No results for input(s): CHOL, HDL, LDLCALC, TRIG, CHOLHDL, LDLDIRECT in the last 72 hours. Thyroid Function Tests: No results for input(s): TSH, T4TOTAL, FREET4, T3FREE, THYROIDAB in the last 72 hours. Anemia Panel: No results for input(s): VITAMINB12, FOLATE, FERRITIN, TIBC, IRON, RETICCTPCT in the last 72 hours. Sepsis Labs: No results for input(s): PROCALCITON, LATICACIDVEN in the last 168 hours.  Recent Results (from the past 240 hour(s))  SARS Coronavirus 2 by RT PCR (hospital order, performed in [facility] hospital lab) Nasopharyngeal Nasopharyngeal Swab     Status: None   Collection Time: 02/21/20  8:30 PM   Specimen: Nasopharyngeal Swab  Result Value Ref  Range Status   SARS Coronavirus 2 NEGATIVE NEGATIVE Final    Comment: (NOTE) SARS-CoV-2 target nucleic acids are NOT DETECTED.  The SARS-CoV-2 RNA is generally detectable in upper and lower respiratory specimens during the acute phase of infection. The lowest concentration of SARS-CoV-2 viral copies this assay can detect is 250 copies / mL. A negative result does not preclude SARS-CoV-2 infection and should not be used as the sole basis for treatment or other patient management decisions.  A negative result may occur with improper specimen collection / handling, submission of specimen other than nasopharyngeal swab, presence of viral mutation(s) within the areas targeted by this assay, and inadequate number of viral copies (<250 copies / mL). A negative result must be combined with clinical observations, patient history, and epidemiological information.  Fact Sheet for Patients:   StrictlyIdeas.no  Fact Sheet for Healthcare Providers: BankingDealers.co.za  This test is not yet approved or  cleared by the [place] FDA and has been authorized for detection and/or diagnosis of SARS-CoV-2 by FDA under an Emergency Use Authorization (EUA).  This EUA will remain in effect (meaning this test can be used) for the duration of the COVID-19 declaration under Section 564(b)(1) of the Act, 21 U.S.C. section 360bbb-3(b)(1), unless the authorization is terminated or revoked sooner.  Performed at [facility], [place] [address], [place], [place] [address]          Radiology Studies: CT ABDOMEN PELVIS WO CONTRAST  Result Date: 02/21/2020 CLINICAL DATA:  Constipation for 1 week, abdominal cramping, nausea EXAM: CT ABDOMEN AND PELVIS WITHOUT CONTRAST TECHNIQUE: Multidetector CT imaging of the abdomen and pelvis was performed following the standard protocol without IV contrast. COMPARISON:  11/09/2016 FINDINGS: Lower chest: No  acute pleural or parenchymal lung disease. Hepatobiliary: No focal liver abnormality is seen. Status post cholecystectomy. No biliary dilatation. Pancreas: Unremarkable. No pancreatic ductal dilatation or surrounding inflammatory changes. Spleen: Normal in size without focal abnormality. Adrenals/Urinary Tract: No urinary tract calculi or obstructive uropathy. The adrenals are  normal. Evaluation of bladder is limited due to streak artifact from bilateral hip arthroplasties. Stomach/Bowel: Prominent diverticulosis of the sigmoid colon. Mild pericolonic fat stranding at the rectosigmoid junction may reflect acute uncomplicated diverticulitis. No bowel obstruction or ileus. Vascular/Lymphatic: Aortic atherosclerosis. No enlarged abdominal or pelvic lymph nodes. Reproductive: Status post hysterectomy. No adnexal masses. Other: No abdominal wall hernia or abnormality. No abdominopelvic ascites. Musculoskeletal: Bilateral hip arthroplasties are noted. No acute or destructive bony lesions. Reconstructed images demonstrate no additional findings. IMPRESSION: 1. Mild acute diverticulitis at the junction of the rectosigmoid colon. No perforation, fluid collection, or abscess. 2.  Aortic Atherosclerosis (ICD10-I70.0). Electronically Signed   By: Randa Ngo M.D.   On: 02/21/2020 18:20        Scheduled Meds: . aspirin EC  81 mg Oral Daily  . clonazePAM  0.5 mg Oral QHS  . enoxaparin (LOVENOX) injection  40 mg Subcutaneous QHS  . guaiFENesin  1,200 mg Oral BID  . insulin aspart  0-9 Units Subcutaneous TID WC  . insulin glargine  8 Units Subcutaneous QHS  . levothyroxine  125 mcg Oral QAC breakfast  . losartan  100 mg Oral Daily  . metoprolol tartrate  37.5 mg Oral BID  . mometasone-formoterol  2 puff Inhalation BID  . nitrofurantoin  100 mg Oral Daily  . sodium chloride flush  3 mL Intravenous Q12H   Continuous Infusions: . cefTRIAXone (ROCEPHIN)  IV     And  . metronidazole 500 mg (02/22/20 1134)      Time spent: 29  minutes with over 50% of the time coordinating the patient's care    Harold Hedge, DO [facility] Pager [phone]  Call night coverage person covering after 7pm

## 2020-02-22 NOTE — Plan of Care (Signed)
Pt will be monitored daily for progression of care & maintanance of symptoms

## 2020-02-22 NOTE — Progress Notes (Signed)
Dulera inhaler offered to patient who refuses stating that she does not need the inhaler and does not want it.  She states that she does not take an inhaler at home, only for rescue.

## 2020-02-23 LAB — CBC
HCT: 33.7 % — ABNORMAL LOW (ref 36.0–46.0)
Hemoglobin: 11.4 g/dL — ABNORMAL LOW (ref 12.0–15.0)
MCH: 32.1 pg (ref 26.0–34.0)
MCHC: 33.8 g/dL (ref 30.0–36.0)
MCV: 94.9 fL (ref 80.0–100.0)
Platelets: 281 10*3/uL (ref 150–400)
RBC: 3.55 MIL/uL — ABNORMAL LOW (ref 3.87–5.11)
RDW: 11.7 % (ref 11.5–15.5)
WBC: 7.9 10*3/uL (ref 4.0–10.5)
nRBC: 0 % (ref 0.0–0.2)

## 2020-02-23 LAB — BASIC METABOLIC PANEL
Anion gap: 8 (ref 5–15)
BUN: 9 mg/dL (ref 8–23)
CO2: 26 mmol/L (ref 22–32)
Calcium: 8.5 mg/dL — ABNORMAL LOW (ref 8.9–10.3)
Chloride: 99 mmol/L (ref 98–111)
Creatinine, Ser: 0.95 mg/dL (ref 0.44–1.00)
GFR calc Af Amer: >60 mL/min (ref >60)
GFR calc non Af Amer: 57 mL/min — ABNORMAL LOW (ref >60)
Glucose, Bld: 96 mg/dL (ref 70–99)
Potassium: 3.6 mmol/L (ref 3.5–5.1)
Sodium: 133 mmol/L — ABNORMAL LOW (ref 135–145)

## 2020-02-23 LAB — GLUCOSE, CAPILLARY
Glucose-Capillary: 83 mg/dL (ref 70–99)
Glucose-Capillary: 119 mg/dL — ABNORMAL HIGH (ref 70–99)

## 2020-02-23 MED ORDER — ONDANSETRON HCL 4 MG PO TABS: 4.0000 mg | ORAL_TABLET | Freq: Three times a day (TID) | ORAL | 0 refills | Status: DC | PRN

## 2020-02-23 MED ORDER — METRONIDAZOLE 500 MG PO TABS
500.0000 mg | ORAL_TABLET | Freq: Three times a day (TID) | ORAL | 0 refills | Status: AC
Start: 2020-02-23 — End: 2020-02-28

## 2020-02-23 MED ORDER — CIPROFLOXACIN HCL 500 MG PO TABS
500.0000 mg | ORAL_TABLET | Freq: Two times a day (BID) | ORAL | 0 refills | Status: AC
Start: 2020-02-23 — End: 2020-02-28

## 2020-02-23 MED ORDER — FLUCONAZOLE 150 MG PO TABS
ORAL_TABLET | ORAL | 0 refills | Status: DC
Start: 2020-02-23 — End: 2020-06-30

## 2020-02-23 NOTE — Discharge Summary (Signed)
Physician Discharge Summary  Jeanette Mcintosh TKZ:[id number] DOB: Sep 19, 1940   PCP: Buzzy Han, MD  Admit date: 02/21/2020 Discharge date: 02/23/2020 Length of Stay: 1 days   Code Status: DNR  Admitted From:  Home Discharged to:   [place]:  None  Equipment/Devices:  None Discharge Condition:  Stable  Recommendations for Outpatient Follow-up   1. Follow up with PCP and GI regarding diverticulitis 2. HCTZ held at discharge due to hyponatremia which improved while holding this during hospitalization  Hospital Summary   This is a 79 year old female with history of paroxysmal atrial fibrillation/flutter on aspirin alone per patient preference, COPD, insulin-dependent type 2 diabetes, hypertension, hypothyroidism, anxiety, diverticulosis who presented with abdominal pain with left lower quadrant pain and constipation over the past 6 days.  Began taking laxatives after discussion with PCP 2 days prior to admission due to constipation however had worsening abdominal pain with nausea and emesis following. ED Course: Initial vitals showed BP 126/78, pulse 94, RR 18, temp 98.2 Fahrenheit, SPO2 96% on room air. Patient did go into A. fib with RVR, rate high 130s to low 140s before spontaneously converting back to normal sinus rhythm. Labs are notable for sodium 127, potassium 3.7, chloride 88, bicarb 24, BUN 14, creatinine 0.9, serum glucose 161, LFTs within normal limits, lipase 32, WBC 13.6, hemoglobin 14.6, platelets 376,000.CT abdomen/pelvis without contrast shows mild acute diverticulitis at the rectosigmoid colon junction without evidence of perforation, fluid collection, or abscess. Patient was given 2 L normal saline, IV ceftriaxone and Flagyl, and admitted for diverticulitis.  6/12: Patient tolerated diet advancement to soft. Patient reported history of nausea with broad-spectrum antibiotics and refused to take p.o. Augmentin at discharge. When asked, she states that  she tolerated ciprofloxacin and Flagyl in the past. She was discharged with 5 additional days antibiotics with ciprofloxacin and Flagyl. Advised to hold her nitrofurantoin on these antibiotics. Additionally, she states that she has a history of vulvovaginitis when taking antibiotics and requested Diflucan. She was given Diflucan prophylaxis.  A & P   Principal Problem:   Diverticulitis of sigmoid colon Active Problems:   Type 2 diabetes mellitus with diabetic nephropathy, with long-term current use of insulin (HCC)   Paroxysmal atrial fibrillation (HCC)   Hyponatremia   Hypothyroidism   Hypertension associated with diabetes (HCC)   COPD (chronic obstructive pulmonary disease) (HCC)   Diverticulitis    1. Sigmoid diverticulitis a. No evidence of perforation, fluid collection or abscess b. Treated with ceftriaxone and Flagyl x2 days c. Tolerated soft diet d. Continue analgesics and antiemetics as needed e. reported history of "nausea with broad-spectrum antibiotics" and refused to take p.o. Augmentin at discharge. When asked, she states that she tolerated ciprofloxacin and Flagyl in the past. She was discharged with 5 additional days antibiotics with ciprofloxacin and Flagyl to complete 7 days antibiotics. Advised to hold her nitrofurantoin on these antibiotics. Additionally, she states that she has a history of vulvovaginitis when taking antibiotics and requested Diflucan. She was given Diflucan prophylaxis.  2. Paroxysmal atrial fibrillation/flutter a. Episode of RVR in the ED which spontaneously converted to sinus rhythm b. CHA2DS2-VASc: 5.  Follows with cardiology and is on aspirin monotherapy per patient preference c. Continue Lopressor d. Continue aspirin  3. Hyponatremia, likely acute on chronic a. Probably from decreased p.o. intake b. Improved with IV fluids and holding HCTZ c. Continue holding HCTZ at discharge  4. Insulin-dependent type 2 diabetes a. Continue home  regimen  5. Hypertension, stable a. Continue  losartan and Lopressor b. HCTZ held  6. COPD, stable a. Continue home meds  7. Hypothyroidism on Synthroid  8. Anxiety on clonazepam  9. 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 Hold nitrofurantoin while on diverticulitis treatment     Consultants  . None  Procedures  . None  Antibiotics   Anti-infectives (From admission, onward)   Start     Dose/Rate Route Frequency Ordered Stop   02/23/20 0000  ciprofloxacin (CIPRO) 500 MG tablet     Discontinue     500 mg Oral 2 times daily 02/23/20 1332 02/28/20 2359   02/23/20 0000  metroNIDAZOLE (FLAGYL) 500 MG tablet     Discontinue     500 mg Oral 3 times daily 02/23/20 1332 02/28/20 2359   02/23/20 0000  fluconazole (DIFLUCAN) 150 MG tablet     Discontinue        02/23/20 1332     02/22/20 2200  amoxicillin-clavulanate (AUGMENTIN) 875-125 MG per tablet 1 tablet     Discontinue     1 tablet Oral Every 12 hours 02/22/20 1718     02/22/20 1800  cefTRIAXone (ROCEPHIN) 2 g in sodium chloride 0.9 % 100 mL IVPB  Status:  Discontinued       "And" Linked Group Details   2 g 200 mL/hr over 30 Minutes Intravenous Every 24 hours 02/21/20 2204 02/22/20 1717   02/22/20 1000  nitrofurantoin (MACRODANTIN) capsule 100 mg     Discontinue     100 mg Oral Daily 02/21/20 2202     02/22/20 0200  metroNIDAZOLE (FLAGYL) IVPB 500 mg  Status:  Discontinued       "And" Linked Group Details   500 mg 100 mL/hr over 60 Minutes Intravenous Every 8 hours 02/21/20 2204 02/22/20 1717   02/21/20 1830  cefTRIAXone (ROCEPHIN) 2 g in sodium chloride 0.9 % 100 mL IVPB       "And" Linked Group Details   2 g 200 mL/hr over 30 Minutes Intravenous  Once 02/21/20 1825 02/21/20 1920   02/21/20 1830  metroNIDAZOLE (FLAGYL) IVPB 500 mg       "And" Linked Group Details   500 mg 100 mL/hr over 60 Minutes Intravenous  Once 02/21/20 1825 02/21/20 2007       Subjective  Patient seen and examined at  bedside no acute distress and resting comfortably.  No events overnight.  Tolerating soft diet. In good spirits and anticipating discharge.  Had bowel movement. Denies any chest pain, shortness of breath, fever, vomiting, urinary or bowel complaints. Otherwise ROS negative    Objective   Discharge Exam: Vitals:   02/22/20 2211 02/23/20 0534  BP:  (!) 143/71  Pulse: 68 64  Resp:  16  Temp:  98.2 F (36.8 C)  SpO2:  97%   Vitals:   02/22/20 1404 02/22/20 2124 02/22/20 2211 02/23/20 0534  BP: (!) 107/55 128/62  (!) 143/71  Pulse: 65 66 68 64  Resp: 16 16  16   Temp: 98.2 F (36.8 C) 97.9 F (36.6 C)  98.2 F (36.8 C)  TempSrc: Oral     SpO2: 97% 98%  97%    Physical Exam Vitals and nursing note reviewed.  Constitutional:      Appearance: Normal appearance.  HENT:     Head: Normocephalic and atraumatic.  Eyes:     Conjunctiva/sclera: Conjunctivae normal.  Cardiovascular:     Rate and Rhythm: Normal rate and regular rhythm.  Pulmonary:     Effort: Pulmonary effort is  normal.     Breath sounds: Normal breath sounds.  Abdominal:     General: Abdomen is flat.     Palpations: Abdomen is soft.     Tenderness: There is no abdominal tenderness.  Musculoskeletal:        General: No swelling or tenderness.  Skin:    Coloration: Skin is not jaundiced or pale.  Neurological:     Mental Status: She is alert. Mental status is at baseline.  Psychiatric:        Mood and Affect: Mood normal.        Behavior: Behavior normal.       The results of significant diagnostics from this hospitalization (including imaging, microbiology, ancillary and laboratory) are listed below for reference.     Microbiology: Recent Results (from the past 240 hour(s))  SARS Coronavirus 2 by RT PCR (hospital order, performed in [facility] hospital lab) Nasopharyngeal Nasopharyngeal Swab     Status: None   Collection Time: 02/21/20  8:30 PM   Specimen: Nasopharyngeal Swab  Result Value Ref  Range Status   SARS Coronavirus 2 NEGATIVE NEGATIVE Final    Comment: (NOTE) SARS-CoV-2 target nucleic acids are NOT DETECTED.  The SARS-CoV-2 RNA is generally detectable in upper and lower respiratory specimens during the acute phase of infection. The lowest concentration of SARS-CoV-2 viral copies this assay can detect is 250 copies / mL. A negative result does not preclude SARS-CoV-2 infection and should not be used as the sole basis for treatment or other patient management decisions.  A negative result may occur with improper specimen collection / handling, submission of specimen other than nasopharyngeal swab, presence of viral mutation(s) within the areas targeted by this assay, and inadequate number of viral copies (<250 copies / mL). A negative result must be combined with clinical observations, patient history, and epidemiological information.  Fact Sheet for Patients:   StrictlyIdeas.no  Fact Sheet for Healthcare Providers: BankingDealers.co.za  This test is not yet approved or  cleared by the [place] FDA and has been authorized for detection and/or diagnosis of SARS-CoV-2 by FDA under an Emergency Use Authorization (EUA).  This EUA will remain in effect (meaning this test can be used) for the duration of the COVID-19 declaration under Section 564(b)(1) of the Act, 21 U.S.C. section 360bbb-3(b)(1), unless the authorization is terminated or revoked sooner.  Performed at [facility], [place] [address], [place], [place] [address]      Labs: BNP (last 3 results) No results for input(s): BNP in the last 8760 hours. Basic Metabolic Panel: Recent Labs  Lab 02/21/20 1638 02/22/20 0342 02/23/20 0520  NA 127* 131* 133*  K 3.7 3.6 3.6  CL 88* 96* 99  CO2 24 24 26   GLUCOSE 161* 124* 96  BUN 14 14 9   CREATININE 0.90 0.97 0.95  CALCIUM 9.3 8.1* 8.5*   Liver Function Tests: Recent Labs  Lab  02/21/20 1638  AST 23  ALT 18  ALKPHOS 78  BILITOT 1.1  PROT 7.7  ALBUMIN 4.7   Recent Labs  Lab 02/21/20 1638  LIPASE 32   No results for input(s): AMMONIA in the last 168 hours. CBC: Recent Labs  Lab 02/21/20 1638 02/22/20 0342 02/23/20 0520  WBC 13.6* 8.4 7.9  HGB 14.6 11.5* 11.4*  HCT 40.9 33.1* 33.7*  MCV 90.7 93.0 94.9  PLT 376 261 281   Cardiac Enzymes: No results for input(s): CKTOTAL, CKMB, CKMBINDEX, TROPONINI in the last 168 hours. BNP: Invalid input(s):  POCBNP CBG: Recent Labs  Lab 02/22/20 1147 02/22/20 1631 02/22/20 2126 02/23/20 0743 02/23/20 1149  GLUCAP 100* 113* 132* 83 119*   D-Dimer No results for input(s): DDIMER in the last 72 hours. Hgb A1c Recent Labs    02/21/20 1638  HGBA1C 6.6*   Lipid Profile No results for input(s): CHOL, HDL, LDLCALC, TRIG, CHOLHDL, LDLDIRECT in the last 72 hours. Thyroid function studies No results for input(s): TSH, T4TOTAL, T3FREE, THYROIDAB in the last 72 hours.  Invalid input(s): FREET3 Anemia work up No results for input(s): VITAMINB12, FOLATE, FERRITIN, TIBC, IRON, RETICCTPCT in the last 72 hours. Urinalysis    Component Value Date/Time   COLORURINE YELLOW 02/21/2020 2232   APPEARANCEUR CLEAR 02/21/2020 2232   LABSPEC 1.003 (L) 02/21/2020 2232   PHURINE 7.0 02/21/2020 2232   GLUCOSEU NEGATIVE 02/21/2020 2232   HGBUR NEGATIVE 02/21/2020 2232   BILIRUBINUR NEGATIVE 02/21/2020 2232   BILIRUBINUR n 03/08/2019 1241   KETONESUR 5 (A) 02/21/2020 2232   PROTEINUR NEGATIVE 02/21/2020 2232   UROBILINOGEN 0.2 03/08/2019 1241   NITRITE NEGATIVE 02/21/2020 2232   LEUKOCYTESUR NEGATIVE 02/21/2020 2232   Sepsis Labs Invalid input(s): PROCALCITONIN,  WBC,  LACTICIDVEN Microbiology Recent Results (from the past 240 hour(s))  SARS Coronavirus 2 by RT PCR (hospital order, performed in [place] hospital lab) Nasopharyngeal Nasopharyngeal Swab     Status: None   Collection Time: 02/21/20  8:30 PM    Specimen: Nasopharyngeal Swab  Result Value Ref Range Status   SARS Coronavirus 2 NEGATIVE NEGATIVE Final    Comment: (NOTE) SARS-CoV-2 target nucleic acids are NOT DETECTED.  The SARS-CoV-2 RNA is generally detectable in upper and lower respiratory specimens during the acute phase of infection. The lowest concentration of SARS-CoV-2 viral copies this assay can detect is 250 copies / mL. A negative result does not preclude SARS-CoV-2 infection and should not be used as the sole basis for treatment or other patient management decisions.  A negative result may occur with improper specimen collection / handling, submission of specimen other than nasopharyngeal swab, presence of viral mutation(s) within the areas targeted by this assay, and inadequate number of viral copies (<250 copies / mL). A negative result must be combined with clinical observations, patient history, and epidemiological information.  Fact Sheet for Patients:   StrictlyIdeas.no  Fact Sheet for Healthcare Providers: BankingDealers.co.za  This test is not yet approved or  cleared by the [place] FDA and has been authorized for detection and/or diagnosis of SARS-CoV-2 by FDA under an Emergency Use Authorization (EUA).  This EUA will remain in effect (meaning this test can be used) for the duration of the COVID-19 declaration under Section 564(b)(1) of the Act, 21 U.S.C. section 360bbb-3(b)(1), unless the authorization is terminated or revoked sooner.  Performed at [facility], [place] [address] [place], [place] [address]     Discharge Instructions     Discharge Instructions    Diet - low sodium heart healthy   Complete by: As directed    Discharge instructions   Complete by: As directed    You were hospitalized for diverticulitis -Take ciprofloxacin 500 mg twice daily for the next 5 days - you can hold your Nitrofurantoin dose while  taking ciprofloxacin -Take metronidazole 500 mg 3 times daily for the next 5 days -You can take Diflucan 150 mg tablet at the beginning of this antibiotic therapy and on the last day of antibiotic therapy to prevent candidal vulvovaginitis -You can take Zofran every 8  hours as needed for nausea or vomiting -While on these antibiotics you should monitor for persistent diarrhea which could be a sign of intestinal infection and you should notify your primary care physician immediately -Stop taking hydrochlorothiazide for now as this seems to have been making your sodium level low.  Readdress this medication with your PCP   Increase activity slowly   Complete by: As directed      Allergies as of 02/23/2020      Reactions   Morphine And Related Nausea Only   Phenergan [promethazine Hcl] Other (See Comments)   Restless leg   Ambien [zolpidem] Other (See Comments)   Unknown   Dicyclomine Other (See Comments)   constipation Other reaction(s): GI Upset (intolerance) constipation   Diphenhydramine Other (See Comments)   Restless leg Restless leg   Erythromycin Nausea And Vomiting   Ivp Dye [iodinated Diagnostic Agents] Nausea And Vomiting   Ondansetron Other (See Comments)   Unknown   Propoxyphene Other (See Comments)   Unknown   Shellfish Allergy Nausea And Vomiting   Statins Other (See Comments)   Unknown   Vitamin E Other (See Comments)   Hot flashes      Medication List    STOP taking these medications   benzonatate 100 MG capsule Commonly known as: TESSALON   hydrochlorothiazide 12.5 MG tablet Commonly known as: HYDRODIURIL   hydrochlorothiazide 25 MG tablet Commonly known as: HYDRODIURIL     TAKE these medications   albuterol 108 (90 Base) MCG/ACT inhaler Commonly known as: ProAir HFA Inhale 2 puffs into the lungs every 4 (four) hours as needed for wheezing or shortness of breath. What changed: Another medication with the same name was removed. Continue taking this  medication, and follow the directions you see here.   aspirin EC 81 MG tablet Take 81 mg by mouth daily.   b complex vitamins tablet Take 1 tablet by mouth daily.   BD Insulin Syringe Ultrafine 31G X 15/64" 0.3 ML Misc Generic drug: Insulin Syringe-Needle U-100 USE TO INJECT 12-20 UNITS OF INSULIN DAILY.   budesonide-formoterol 160-4.5 MCG/ACT inhaler Commonly known as: SYMBICORT Inhale 2 puffs into the lungs 2 (two) times daily.   carboxymethylcellulose 0.5 % Soln Commonly known as: REFRESH PLUS Place 1 drop into both eyes 3 (three) times daily as needed (dry eyes).   cetirizine 10 MG tablet Commonly known as: ZYRTEC Take 10 mg by mouth daily.   ciprofloxacin 500 MG tablet Commonly known as: Cipro Take 1 tablet (500 mg total) by mouth 2 (two) times daily for 5 days.   clonazePAM 1 MG tablet Commonly known as: KLONOPIN TAKE 1/2 TO 3/4 TABLET AT NIGHT BEFORE BEDTIME FOR ANXIETY AND SLEEP What changed:   how much to take  how to take this  when to take this  additional instructions   estrogens (conjugated) 0.3 MG tablet Commonly known as: PREMARIN Take 0.3 mg by mouth every Monday, Wednesday, and Friday. Take daily for 21 days then do not take for 7 days.   fluconazole 150 MG tablet Commonly known as: Diflucan Take 1 tablet (150 mg) at the start of antibiotic therapy and 1 tablet (150 mg) at the end of antibiotic treatment   FOLIC ACID PO Take 433 mcg by mouth daily.   guaiFENesin 600 MG 12 hr tablet Commonly known as: MUCINEX Take 1,200 mg by mouth 2 (two) times daily.   insulin glargine 100 UNIT/ML injection Commonly known as: Lantus USE 12-20 UNITS DAILY AS DIRECTED What changed:  how much to take  how to take this  when to take this  additional instructions   latanoprost 0.005 % ophthalmic solution Commonly known as: XALATAN Place 1 drop into both eyes at bedtime.   levothyroxine 125 MCG tablet Commonly known as: Synthroid Take 1 tablet  (125 mcg total) by mouth daily before breakfast.   losartan 100 MG tablet Commonly known as: COZAAR Take 1 tablet (100 mg total) by mouth daily.   meloxicam 7.5 MG tablet Commonly known as: MOBIC Take 1 tablet (7.5 mg total) by mouth daily.   metoprolol tartrate 25 MG tablet Commonly known as: LOPRESSOR TAKE 1 AND 1/2 TABLETS(37.5 MG) BY MOUTH TWICE DAILY What changed: See the new instructions.   metroNIDAZOLE 500 MG tablet Commonly known as: Flagyl Take 1 tablet (500 mg total) by mouth 3 (three) times daily for 5 days.   nitrofurantoin 100 MG capsule Commonly known as: MACRODANTIN Take 100 mg by mouth daily.   NON FORMULARY Take 11 g by mouth daily. Boswellia   ondansetron 4 MG tablet Commonly known as: Zofran Take 1 tablet (4 mg total) by mouth every 8 (eight) hours as needed for nausea or vomiting.   OneTouch Verio test strip Generic drug: glucose blood Use to test blood glucose three times daily.   oxymetazoline 0.05 % nasal spray Commonly known as: AFRIN Place 1 spray into both nostrils 2 (two) times daily as needed for congestion.   PREBIOTIC PRODUCT PO Take 1 tablet by mouth in the morning and at bedtime. 16 Strain   RUTIN PO Take 500 mg by mouth daily.   TURMERIC PO Take 1,500 mg by mouth in the morning and at bedtime. With Black Pepper and [place]wellia   VITAMIN A PO Take 10,000 Units by mouth daily.   vitamin C 250 MG tablet Commonly known as: ASCORBIC ACID Take 250 mg by mouth daily.   VITAMIN D PO Take 4,000 Units by mouth daily.       Allergies  Allergen Reactions  . Morphine And Related Nausea Only  . Phenergan [Promethazine Hcl] Other (See Comments)    Restless leg  . Ambien [Zolpidem] Other (See Comments)    Unknown  . Dicyclomine Other (See Comments)    constipation Other reaction(s): GI Upset (intolerance) constipation  . Diphenhydramine Other (See Comments)    Restless leg Restless leg  . Erythromycin Nausea And Vomiting  .  Ivp Dye [Iodinated Diagnostic Agents] Nausea And Vomiting  . Ondansetron Other (See Comments)    Unknown  . Propoxyphene Other (See Comments)    Unknown  . Shellfish Allergy Nausea And Vomiting  . Statins Other (See Comments)    Unknown  . Vitamin E Other (See Comments)    Hot flashes     Dispo: The patient is from: Home              Anticipated d/c is to: Home              Anticipated d/c date is: today               Patient currently is medically stable to d/c.       Time coordinating discharge: Over 30 minutes   SIGNED:   Harold Hedge, D.O. [facility] Pager: [phone]  02/23/2020, 1:38 PM

## 2020-02-23 NOTE — Progress Notes (Signed)
Pt leaving this afternoon with her "friend". Alert, oriented, and without c/o. Discharge instructions given/explained with pt verbalizing understanding.  Pt aware of new medications and to followup with PCP.

## 2020-02-29 ENCOUNTER — Ambulatory Visit
Admission: RE | Admit: 2020-02-29 | Discharge: 2020-02-29 | Disposition: A | Discharge status: Home / Self Care | Payer: Medicare HMO | Source: Ambulatory Visit | Attending: Family Medicine | Admitting: Family Medicine

## 2020-02-29 DIAGNOSIS — I739 Peripheral vascular disease, unspecified: Secondary | ICD-10-CM

## 2020-04-07 ENCOUNTER — Other Ambulatory Visit: Payer: Self-pay | Admitting: Adult Health

## 2020-04-08 NOTE — Telephone Encounter (Signed)
DENIED.  PT NEEDS A FOLLOW UP APPOINTMENT FOR FURTHER REFILLS.

## 2020-04-26 ENCOUNTER — Other Ambulatory Visit: Payer: Self-pay | Admitting: Adult Health

## 2020-04-26 DIAGNOSIS — I10 Essential (primary) hypertension: Secondary | ICD-10-CM

## 2020-04-29 NOTE — Telephone Encounter (Signed)
30 DAY SUPPLY SENT TO THE PHARMACY.  PT IS DUE FOR CPX. 

## 2020-05-16 ENCOUNTER — Telehealth: Payer: Self-pay

## 2020-05-16 NOTE — Telephone Encounter (Signed)
Patient called to inform you of some symptoms she was having yesterday. She was having high BP 127/114 and irregular HR at 136. The patient also said she took an extra metoprolol and a few extra aspirin and this seemed to help.

## 2020-05-31 ENCOUNTER — Other Ambulatory Visit: Payer: Self-pay | Admitting: Cardiology

## 2020-05-31 DIAGNOSIS — I10 Essential (primary) hypertension: Secondary | ICD-10-CM

## 2020-05-31 DIAGNOSIS — I480 Paroxysmal atrial fibrillation: Secondary | ICD-10-CM

## 2020-06-25 ENCOUNTER — Emergency Department (HOSPITAL_COMMUNITY): Payer: Medicare HMO

## 2020-06-25 ENCOUNTER — Encounter (HOSPITAL_COMMUNITY): Payer: Self-pay

## 2020-06-25 ENCOUNTER — Inpatient Hospital Stay (HOSPITAL_COMMUNITY)
Admission: EM | Admit: 2020-06-25 | Discharge: 2020-07-03 | DRG: 392 | Disposition: A | Discharge status: Hospice | Payer: Medicare HMO | Source: Ambulatory Visit | Attending: Internal Medicine | Admitting: Internal Medicine

## 2020-06-25 ENCOUNTER — Other Ambulatory Visit: Payer: Self-pay

## 2020-06-25 DIAGNOSIS — E785 Hyperlipidemia, unspecified: Secondary | ICD-10-CM | POA: Diagnosis present

## 2020-06-25 DIAGNOSIS — I480 Paroxysmal atrial fibrillation: Secondary | ICD-10-CM | POA: Diagnosis present

## 2020-06-25 DIAGNOSIS — K5792 Diverticulitis of intestine, part unspecified, without perforation or abscess without bleeding: Secondary | ICD-10-CM

## 2020-06-25 DIAGNOSIS — Z5320 Procedure and treatment not carried out because of patient's decision for unspecified reasons: Secondary | ICD-10-CM | POA: Diagnosis not present

## 2020-06-25 DIAGNOSIS — E1121 Type 2 diabetes mellitus with diabetic nephropathy: Secondary | ICD-10-CM | POA: Diagnosis present

## 2020-06-25 DIAGNOSIS — I4891 Unspecified atrial fibrillation: Secondary | ICD-10-CM

## 2020-06-25 DIAGNOSIS — E861 Hypovolemia: Secondary | ICD-10-CM | POA: Diagnosis present

## 2020-06-25 DIAGNOSIS — K56609 Unspecified intestinal obstruction, unspecified as to partial versus complete obstruction: Secondary | ICD-10-CM | POA: Diagnosis not present

## 2020-06-25 DIAGNOSIS — Z87891 Personal history of nicotine dependence: Secondary | ICD-10-CM

## 2020-06-25 DIAGNOSIS — Z66 Do not resuscitate: Secondary | ICD-10-CM | POA: Diagnosis not present

## 2020-06-25 DIAGNOSIS — N182 Chronic kidney disease, stage 2 (mild): Secondary | ICD-10-CM | POA: Diagnosis present

## 2020-06-25 DIAGNOSIS — K589 Irritable bowel syndrome without diarrhea: Secondary | ICD-10-CM | POA: Diagnosis present

## 2020-06-25 DIAGNOSIS — N179 Acute kidney failure, unspecified: Secondary | ICD-10-CM | POA: Diagnosis present

## 2020-06-25 DIAGNOSIS — Z79890 Hormone replacement therapy (postmenopausal): Secondary | ICD-10-CM

## 2020-06-25 DIAGNOSIS — I129 Hypertensive chronic kidney disease with stage 1 through stage 4 chronic kidney disease, or unspecified chronic kidney disease: Secondary | ICD-10-CM | POA: Diagnosis not present

## 2020-06-25 DIAGNOSIS — Z808 Family history of malignant neoplasm of other organs or systems: Secondary | ICD-10-CM

## 2020-06-25 DIAGNOSIS — Z90711 Acquired absence of uterus with remaining cervical stump: Secondary | ICD-10-CM

## 2020-06-25 DIAGNOSIS — R10814 Left lower quadrant abdominal tenderness: Secondary | ICD-10-CM | POA: Diagnosis not present

## 2020-06-25 DIAGNOSIS — Z7982 Long term (current) use of aspirin: Secondary | ICD-10-CM

## 2020-06-25 DIAGNOSIS — E039 Hypothyroidism, unspecified: Secondary | ICD-10-CM | POA: Diagnosis present

## 2020-06-25 DIAGNOSIS — Z515 Encounter for palliative care: Secondary | ICD-10-CM | POA: Diagnosis not present

## 2020-06-25 DIAGNOSIS — I959 Hypotension, unspecified: Secondary | ICD-10-CM | POA: Diagnosis present

## 2020-06-25 DIAGNOSIS — Z9849 Cataract extraction status, unspecified eye: Secondary | ICD-10-CM

## 2020-06-25 DIAGNOSIS — Z9049 Acquired absence of other specified parts of digestive tract: Secondary | ICD-10-CM

## 2020-06-25 DIAGNOSIS — Z20822 Contact with and (suspected) exposure to covid-19: Secondary | ICD-10-CM | POA: Diagnosis present

## 2020-06-25 DIAGNOSIS — Z91013 Allergy to seafood: Secondary | ICD-10-CM

## 2020-06-25 DIAGNOSIS — Z885 Allergy status to narcotic agent status: Secondary | ICD-10-CM

## 2020-06-25 DIAGNOSIS — Z794 Long term (current) use of insulin: Secondary | ICD-10-CM | POA: Diagnosis not present

## 2020-06-25 DIAGNOSIS — E871 Hypo-osmolality and hyponatremia: Secondary | ICD-10-CM | POA: Diagnosis present

## 2020-06-25 DIAGNOSIS — K219 Gastro-esophageal reflux disease without esophagitis: Secondary | ICD-10-CM | POA: Diagnosis present

## 2020-06-25 DIAGNOSIS — Z96643 Presence of artificial hip joint, bilateral: Secondary | ICD-10-CM | POA: Diagnosis present

## 2020-06-25 DIAGNOSIS — R339 Retention of urine, unspecified: Secondary | ICD-10-CM | POA: Diagnosis not present

## 2020-06-25 DIAGNOSIS — N816 Rectocele: Secondary | ICD-10-CM | POA: Diagnosis present

## 2020-06-25 DIAGNOSIS — K567 Ileus, unspecified: Secondary | ICD-10-CM | POA: Diagnosis present

## 2020-06-25 DIAGNOSIS — F419 Anxiety disorder, unspecified: Secondary | ICD-10-CM | POA: Diagnosis present

## 2020-06-25 DIAGNOSIS — D72829 Elevated white blood cell count, unspecified: Secondary | ICD-10-CM | POA: Diagnosis not present

## 2020-06-25 DIAGNOSIS — R1110 Vomiting, unspecified: Secondary | ICD-10-CM

## 2020-06-25 DIAGNOSIS — R10813 Right lower quadrant abdominal tenderness: Secondary | ICD-10-CM | POA: Diagnosis not present

## 2020-06-25 DIAGNOSIS — Z6826 Body mass index (BMI) 26.0-26.9, adult: Secondary | ICD-10-CM

## 2020-06-25 DIAGNOSIS — Z888 Allergy status to other drugs, medicaments and biological substances status: Secondary | ICD-10-CM

## 2020-06-25 DIAGNOSIS — E1122 Type 2 diabetes mellitus with diabetic chronic kidney disease: Secondary | ICD-10-CM | POA: Diagnosis present

## 2020-06-25 DIAGNOSIS — K651 Peritoneal abscess: Secondary | ICD-10-CM | POA: Diagnosis present

## 2020-06-25 DIAGNOSIS — Z83511 Family history of glaucoma: Secondary | ICD-10-CM

## 2020-06-25 DIAGNOSIS — J449 Chronic obstructive pulmonary disease, unspecified: Secondary | ICD-10-CM | POA: Diagnosis present

## 2020-06-25 DIAGNOSIS — K5720 Diverticulitis of large intestine with perforation and abscess without bleeding: Secondary | ICD-10-CM | POA: Diagnosis present

## 2020-06-25 DIAGNOSIS — H3530 Unspecified macular degeneration: Secondary | ICD-10-CM | POA: Diagnosis present

## 2020-06-25 DIAGNOSIS — E669 Obesity, unspecified: Secondary | ICD-10-CM | POA: Diagnosis present

## 2020-06-25 DIAGNOSIS — Z79899 Other long term (current) drug therapy: Secondary | ICD-10-CM

## 2020-06-25 DIAGNOSIS — Z7189 Other specified counseling: Secondary | ICD-10-CM | POA: Diagnosis not present

## 2020-06-25 DIAGNOSIS — I071 Rheumatic tricuspid insufficiency: Secondary | ICD-10-CM | POA: Diagnosis present

## 2020-06-25 DIAGNOSIS — R000 Tachycardia, unspecified: Secondary | ICD-10-CM | POA: Diagnosis not present

## 2020-06-25 DIAGNOSIS — Z91041 Radiographic dye allergy status: Secondary | ICD-10-CM

## 2020-06-25 DIAGNOSIS — Z803 Family history of malignant neoplasm of breast: Secondary | ICD-10-CM

## 2020-06-25 DIAGNOSIS — K631 Perforation of intestine (nontraumatic): Secondary | ICD-10-CM | POA: Diagnosis present

## 2020-06-25 DIAGNOSIS — Z8541 Personal history of malignant neoplasm of cervix uteri: Secondary | ICD-10-CM

## 2020-06-25 DIAGNOSIS — I10 Essential (primary) hypertension: Secondary | ICD-10-CM | POA: Diagnosis present

## 2020-06-25 DIAGNOSIS — Z820 Family history of epilepsy and other diseases of the nervous system: Secondary | ICD-10-CM

## 2020-06-25 DIAGNOSIS — R110 Nausea: Secondary | ICD-10-CM | POA: Diagnosis not present

## 2020-06-25 DIAGNOSIS — Z0189 Encounter for other specified special examinations: Secondary | ICD-10-CM

## 2020-06-25 LAB — CBC WITH DIFFERENTIAL/PLATELET
Abs Immature Granulocytes: 0.24 10*3/uL — ABNORMAL HIGH (ref 0.00–0.07)
Basophils Absolute: 0.1 10*3/uL (ref 0.0–0.1)
Basophils Relative: 1 %
Eosinophils Absolute: 0.2 10*3/uL (ref 0.0–0.5)
Eosinophils Relative: 1 %
HCT: 38.9 % (ref 36.0–46.0)
Hemoglobin: 13.4 g/dL (ref 12.0–15.0)
Immature Granulocytes: 1 %
Lymphocytes Relative: 6 %
Lymphs Abs: 1.1 10*3/uL (ref 0.7–4.0)
MCH: 32 pg (ref 26.0–34.0)
MCHC: 34.4 g/dL (ref 30.0–36.0)
MCV: 92.8 fL (ref 80.0–100.0)
Monocytes Absolute: 1.7 10*3/uL — ABNORMAL HIGH (ref 0.1–1.0)
Monocytes Relative: 8 %
Neutro Abs: 16.5 10*3/uL — ABNORMAL HIGH (ref 1.7–7.7)
Neutrophils Relative %: 83 %
Platelets: 444 10*3/uL — ABNORMAL HIGH (ref 150–400)
RBC: 4.19 MIL/uL (ref 3.87–5.11)
RDW: 12 % (ref 11.5–15.5)
WBC: 19.8 10*3/uL — ABNORMAL HIGH (ref 4.0–10.5)
nRBC: 0 % (ref 0.0–0.2)

## 2020-06-25 LAB — RESPIRATORY PANEL BY RT PCR (FLU A&B, COVID)
Influenza A by PCR: NEGATIVE
Influenza B by PCR: NEGATIVE
SARS Coronavirus 2 by RT PCR: NEGATIVE

## 2020-06-25 LAB — TROPONIN I (HIGH SENSITIVITY)
Troponin I (High Sensitivity): 20 ng/L — ABNORMAL HIGH (ref <18)
Troponin I (High Sensitivity): 33 ng/L — ABNORMAL HIGH (ref <18)

## 2020-06-25 LAB — COMPREHENSIVE METABOLIC PANEL
ALT: 16 U/L (ref 0–44)
AST: 19 U/L (ref 15–41)
Albumin: 2.8 g/dL — ABNORMAL LOW (ref 3.5–5.0)
Alkaline Phosphatase: 80 U/L (ref 38–126)
Anion gap: 16 — ABNORMAL HIGH (ref 5–15)
BUN: 25 mg/dL — ABNORMAL HIGH (ref 8–23)
CO2: 21 mmol/L — ABNORMAL LOW (ref 22–32)
Calcium: 8.7 mg/dL — ABNORMAL LOW (ref 8.9–10.3)
Chloride: 88 mmol/L — ABNORMAL LOW (ref 98–111)
Creatinine, Ser: 1.74 mg/dL — ABNORMAL HIGH (ref 0.44–1.00)
GFR, Estimated: 27 mL/min — ABNORMAL LOW (ref >60)
Glucose, Bld: 155 mg/dL — ABNORMAL HIGH (ref 70–99)
Potassium: 4.1 mmol/L (ref 3.5–5.1)
Sodium: 125 mmol/L — ABNORMAL LOW (ref 135–145)
Total Bilirubin: 0.9 mg/dL (ref 0.3–1.2)
Total Protein: 6 g/dL — ABNORMAL LOW (ref 6.5–8.1)

## 2020-06-25 LAB — URINALYSIS, ROUTINE W REFLEX MICROSCOPIC
Bilirubin Urine: NEGATIVE
Glucose, UA: NEGATIVE mg/dL
Hgb urine dipstick: NEGATIVE
Ketones, ur: 20 mg/dL — AB
Nitrite: NEGATIVE
Protein, ur: NEGATIVE mg/dL
Specific Gravity, Urine: 1.011 (ref 1.005–1.030)
pH: 6 (ref 5.0–8.0)

## 2020-06-25 LAB — TSH: TSH: 0.532 u[IU]/mL (ref 0.350–4.500)

## 2020-06-25 LAB — OSMOLALITY, URINE: Osmolality, Ur: 303 mOsm/kg (ref 300–900)

## 2020-06-25 LAB — LACTIC ACID, PLASMA
Lactic Acid, Venous: 1.7 mmol/L (ref 0.5–1.9)
Lactic Acid, Venous: 1.5 mmol/L (ref 0.5–1.9)

## 2020-06-25 LAB — CBG MONITORING, ED
Glucose-Capillary: 137 mg/dL — ABNORMAL HIGH (ref 70–99)
Glucose-Capillary: 99 mg/dL (ref 70–99)

## 2020-06-25 LAB — SODIUM, URINE, RANDOM: Sodium, Ur: 22 mmol/L

## 2020-06-25 LAB — LIPASE, BLOOD: Lipase: 42 U/L (ref 11–51)

## 2020-06-25 LAB — PREALBUMIN: Prealbumin: 11.8 mg/dL — ABNORMAL LOW (ref 18–38)

## 2020-06-25 LAB — OSMOLALITY: Osmolality: 271 mOsm/kg — ABNORMAL LOW (ref 275–295)

## 2020-06-25 MED ORDER — PIPERACILLIN-TAZOBACTAM 3.375 G IVPB 30 MIN: 3.3750 g | Freq: Three times a day (TID) | INTRAVENOUS | Status: DC

## 2020-06-25 MED ORDER — INSULIN ASPART 100 UNIT/ML ~~LOC~~ SOLN: 0.0000 [IU] | Freq: Every day | SUBCUTANEOUS | Status: DC

## 2020-06-25 MED ORDER — SODIUM CHLORIDE 0.9 % IV BOLUS
500.0000 mL | Freq: Once | INTRAVENOUS | Status: AC
  Administered 2020-06-25: 500 mL via INTRAVENOUS

## 2020-06-25 MED ORDER — ONDANSETRON HCL 4 MG/2ML IJ SOLN
4.0000 mg | Freq: Once | INTRAMUSCULAR | Status: AC
  Administered 2020-06-25: 4 mg via INTRAVENOUS
  Filled 2020-06-25: qty 2

## 2020-06-25 MED ORDER — PIPERACILLIN-TAZOBACTAM 3.375 G IVPB
3.3750 g | Freq: Three times a day (TID) | INTRAVENOUS | Status: DC
  Administered 2020-06-25 – 2020-07-03 (×23): 3.375 g via INTRAVENOUS
  Filled 2020-06-25 (×25): qty 50

## 2020-06-25 MED ORDER — HEPARIN (PORCINE) 25000 UT/250ML-% IV SOLN
750.0000 [IU]/h | INTRAVENOUS | Status: DC
  Filled 2020-06-25: qty 250

## 2020-06-25 MED ORDER — DILTIAZEM HCL-DEXTROSE 125-5 MG/125ML-% IV SOLN (PREMIX)
5.0000 mg/h | INTRAVENOUS | Status: DC
  Filled 2020-06-25: qty 125

## 2020-06-25 MED ORDER — INSULIN ASPART 100 UNIT/ML ~~LOC~~ SOLN: 0.0000 [IU] | Freq: Three times a day (TID) | SUBCUTANEOUS | Status: DC

## 2020-06-25 MED ORDER — FENTANYL CITRATE (PF) 100 MCG/2ML IJ SOLN
25.0000 ug | Freq: Once | INTRAMUSCULAR | Status: AC
  Administered 2020-06-25: 25 ug via INTRAVENOUS
  Filled 2020-06-25: qty 2

## 2020-06-25 MED ORDER — LACTATED RINGERS IV SOLN: INTRAVENOUS | Status: AC

## 2020-06-25 MED ORDER — HYDROMORPHONE HCL 1 MG/ML IJ SOLN
0.5000 mg | Freq: Once | INTRAMUSCULAR | Status: AC | PRN
  Administered 2020-06-25: 0.5 mg via INTRAVENOUS
  Filled 2020-06-25: qty 1

## 2020-06-25 MED ORDER — MOMETASONE FURO-FORMOTEROL FUM 200-5 MCG/ACT IN AERO: 2.0000 | INHALATION_SPRAY | Freq: Two times a day (BID) | RESPIRATORY_TRACT | Status: DC

## 2020-06-25 MED ORDER — AMIODARONE HCL IN DEXTROSE 360-4.14 MG/200ML-% IV SOLN: 30.0000 mg/h | INTRAVENOUS | Status: DC

## 2020-06-25 MED ORDER — PIPERACILLIN-TAZOBACTAM 3.375 G IVPB 30 MIN
3.3750 g | Freq: Once | INTRAVENOUS | Status: AC
  Administered 2020-06-25: 3.375 g via INTRAVENOUS
  Filled 2020-06-25: qty 50

## 2020-06-25 MED ORDER — SODIUM CHLORIDE 0.9 % IV BOLUS
1000.0000 mL | Freq: Once | INTRAVENOUS | Status: AC
  Administered 2020-06-25: 1000 mL via INTRAVENOUS

## 2020-06-25 MED ORDER — AMIODARONE HCL IN DEXTROSE 360-4.14 MG/200ML-% IV SOLN
60.0000 mg/h | INTRAVENOUS | Status: DC
  Filled 2020-06-25: qty 200

## 2020-06-25 MED ORDER — HEPARIN BOLUS VIA INFUSION
2750.0000 [IU] | Freq: Once | INTRAVENOUS | Status: DC
  Filled 2020-06-25: qty 2750

## 2020-06-25 NOTE — Consult Note (Addendum)
Chief Complaint: Patient was seen in consultation today for CT guided drainage of diverticular abscess  Chief Complaint  Patient presents with  . Hypotension  . Atrial Fibrillation    Referring Physician(s): [place]  Supervising Physician: Arne [place]  Patient Status: [facility] - ED  History of Present Illness: Jeanette Mcintosh is a 79 y.o. female with PMH including CKD, asthma, cervical cancer, COPD, GERD, HLD,HTN, IDDM, IBS, diverticulosis, PAF,and macular degeneration. Also with prior cholecystectomy, partial hysterectomy and appendectomy. She presented to [facility] ED today with persistent abd pain, hypotension and afib as well as intermittent temp elevations, nausea, loose stools. Subsequent imaging revealed: 1. Thickened appearance of the distal sigmoid colon with surrounding inflammatory changes and an adjacent fluid and air containing collection measuring approximately 6.0 x 5.2 x 4.5 cm. There is also small volume pneumoperitoneum. Findings compatible with perforated acute sigmoid diverticulitis with adjacent diverticular abscess. Surgical consultation is recommended. 2. Multiple mildly dilated and fluid-filled loops of small bowel throughout the abdomen. Findings favored to represent reactive ileus. 3. There is a loop of small bowel within the left hemiabdomen which demonstrates prominent short segment luminal narrowing suggesting a site of focal stricture. Underlying mass not excluded. Evaluation is limited in the absence of intravenous and oral contrast.  She is currently afebrile, WBC 19.8, hgb nl; creat 1.74, trop 20, LA nl; COVID- 19 neg; PT/INR pending. Request now received from [facility] for image guided drainage of diverticular abscess.  Aortic Atherosclerosis  Past Medical History:  Diagnosis Date  . Acute kidney injury superimposed on chronic kidney disease ([place]) 11/09/2016  . Arthritis   . Asthma   . Blood transfusion without reported diagnosis   . Cancer  (HCC)    Cervical Cancer   . Cataract   . COPD (chronic obstructive pulmonary disease) ([place])   . Eczema   . GERD (gastroesophageal reflux disease)   . Hyperlipidemia   . Hypertension   . IDDM (insulin dependent diabetes mellitus)   . Irritable bowel syndrome   . Macular degeneration   . PAF (paroxysmal atrial fibrillation) ([place]) 2007  . Paroxysmal atrial fibrillation ([place]) 11/09/2016  . Paroxysmal atrial flutter ([place])   . Thyroid disease     Past Surgical History:  Procedure Laterality Date  . ABDOMINAL HYSTERECTOMY  1971   Partial  . APPENDECTOMY  1971  . CATARACT EXTRACTION  2002  . CHOLECYSTECTOMY, LAPAROSCOPIC  2011  . HEMORRHOID SURGERY  1990's   x2  . renal doppler  12/12/06  . TOTAL HIP ARTHROPLASTY  2012   Left  . TOTAL HIP ARTHROPLASTY  2002   Right  . TRIGGER FINGER RELEASE  1990's   Several surgeries.  . US ECHOCARDIOGRAPHY  01/20/12    Allergies: Morphine and related, Phenergan [promethazine hcl], Ambien [zolpidem], Dicyclomine, Diphenhydramine, Erythromycin, Ivp dye [iodinated diagnostic agents], Ondansetron, Propoxyphene, Shellfish allergy, Statins, and Vitamin e  Medications: Prior to Admission medications   Medication Sig Start Date End Date Taking? Authorizing Provider  aspirin EC 81 MG tablet Take 81 mg by mouth daily.   Yes [provider]  b complex vitamins tablet Take 1 tablet by mouth daily.   Yes [provider]  carboxymethylcellulose (REFRESH PLUS) 0.5 % SOLN Place 1 drop into both eyes 3 (three) times daily as needed (dry eyes).   Yes [provider]  cetirizine (ZYRTEC) 10 MG tablet Take 10 mg by mouth daily.   Yes [provider]  Cholecalciferol (VITAMIN D PO) Take 4,000 Units by mouth daily.  Yes [provider]  clonazePAM (KLONOPIN) 1 MG tablet TAKE 1/2 TO 3/4 TABLET AT NIGHT BEFORE BEDTIME FOR ANXIETY AND SLEEP Patient taking differently: Take 0.5 mg by mouth at bedtime.  10/30/19  Yes  Nafziger, Tommi Rumps, NP  FOLIC ACID PO Take 786 mcg by mouth daily.    Yes [provider]  guaiFENesin (MUCINEX) 600 MG 12 hr tablet Take 1,200 mg by mouth 2 (two) times daily.   Yes [provider]  hydrochlorothiazide (HYDRODIURIL) 12.5 MG tablet Take 12.5 mg by mouth daily. 03/05/20  Yes [provider]  insulin glargine (LANTUS) 100 UNIT/ML injection USE 12-20 UNITS DAILY AS DIRECTED Patient taking differently: Inject 8-20 Units into the skin at bedtime. Takes only if blood sugar more than 150. 07/31/19  Yes Nafziger, Tommi Rumps, NP  latanoprost (XALATAN) 0.005 % ophthalmic solution Place 1 drop into both eyes at bedtime.  10/13/12  Yes [provider]  levothyroxine (SYNTHROID) 112 MCG tablet Take 112 mcg by mouth daily. 06/20/20  Yes [provider]  losartan (COZAAR) 100 MG tablet Take 1 tablet by mouth daily.  **DUE FOR YEARLY PHYSICAL** 04/29/20  Yes Nafziger, Tommi Rumps, NP  metoprolol tartrate (LOPRESSOR) 25 MG tablet TAKE 1 AND 1/2 TABLETS(37.5 MG) BY MOUTH TWICE DAILY Patient taking differently: Take 37.5 mg by mouth 2 (two) times daily. TAKE 1 AND 1/2 TABLETS(37.5 MG) BY MOUTH TWICE DAILY 06/02/20  Yes Adrian Prows, MD  nitrofurantoin (MACRODANTIN) 100 MG capsule Take 100 mg by mouth daily. 01/23/20  Yes [provider]  NON FORMULARY Take 11 g by mouth daily. [place]   Yes [provider]  ondansetron (ZOFRAN) 4 MG tablet Take 1 tablet (4 mg total) by mouth every 8 (eight) hours as needed for nausea or vomiting. 02/23/20  Yes Harold Hedge, MD  oxymetazoline (AFRIN) 0.05 % nasal spray Place 1 spray into both nostrils 2 (two) times daily as needed for congestion.   Yes [provider]  RUTIN PO Take 500 mg by mouth daily.    Yes [provider]  TURMERIC PO Take 1,500 mg by mouth in the morning and at bedtime. With Black Pepper and [place]   Yes [provider]  VITAMIN A PO Take 10,000 Units by mouth daily.    Yes  [provider]  vitamin C (ASCORBIC ACID) 250 MG tablet Take 250 mg by mouth daily.   Yes [provider]  albuterol (PROAIR HFA) 108 (90 Base) MCG/ACT inhaler Inhale 2 puffs into the lungs every 4 (four) hours as needed for wheezing or shortness of breath. Patient not taking: Reported on 02/21/2020 03/18/16   Gean Quint, MD  budesonide-formoterol [facility]) 160-4.5 MCG/ACT inhaler Inhale 2 puffs into the lungs 2 (two) times daily. Patient not taking: Reported on 02/21/2020 07/31/19   Dorothyann Peng, NP  estrogens, conjugated, (PREMARIN) 0.3 MG tablet Take 0.3 mg by mouth every Monday, Wednesday, and Friday. Take daily for 21 days then do not take for 7 days.    [provider]  fluconazole (DIFLUCAN) 150 MG tablet Take 1 tablet (150 mg) at the start of antibiotic therapy and 1 tablet (150 mg) at the end of antibiotic treatment Patient not taking: Reported on 06/25/2020 02/23/20   Harold Hedge, MD  glucose blood (ONETOUCH VERIO) test strip Use to test blood glucose three times daily. 05/08/19   Nafziger, Tommi Rumps, NP  Insulin Syringe-Needle U-100 (BD INSULIN SYRINGE ULTRAFINE) 31G X 15/64" 0.3 ML MISC USE TO INJECT 12-20 UNITS  OF INSULIN DAILY. 07/31/19   Nafziger, Tommi Rumps, NP  meloxicam (MOBIC) 7.5 MG tablet Take 1 tablet (7.5 mg total) by mouth daily. Patient not taking: Reported on 06/25/2020 05/01/19   Dorothyann Peng, NP     Family History  Problem Relation Age of Onset  . Basal cell carcinoma Father   . Dementia Father   . Parkinson's disease Father   . Diverticulitis Father   . Cancer Other        All (5) had Hysterectomies and high dose Estrogen in 1950's were all diagnosed with Breast Cancer.  . Glaucoma Mother   . Allergic rhinitis Mother   . Parkinson's disease Paternal Grandmother   . Eczema Son     Social History   Socioeconomic History  . Marital status: Divorced    Spouse name: Not on file  . Number of children: 6  . Years of education: Not on  file  . Highest education level: Not on file  Occupational History  . Occupation: retired    Comment: retired Engineer, water  Tobacco Use  . Smoking status: Former Smoker    Packs/day: 0.25    Years: 25.00    Pack years: 6.25    Types: Cigarettes    Start date: 09/14/1950    Quit date: 09/13/2013    Years since quitting: 6.7  . Smokeless tobacco: Never Used  . Tobacco comment: Pt has stopped and started smoking muliple times over the years.  Amount varied during the years.  Vaping Use  . Vaping Use: Never used  Substance and Sexual Activity  . Alcohol use: Yes    Alcohol/week: 0.0 standard drinks    Comment: 1/Evening  . Drug use: No  . Sexual activity: Not on file  Other Topics Concern  . Not on file  Social History Narrative   Lives alone.   Originally from [place].   Retired Engineer, water.   Scientist, physiological Strain: [place]   . Difficulty of Paying Living Expenses: Not hard at all  Food Insecurity: No Food Insecurity  . Worried About Charity fundraiser in the Last Year: Never true  . Ran Out of Food in the Last Year: Never true  Transportation Needs: No Transportation Needs  . Lack of Transportation (Medical): No  . Lack of Transportation (Non-Medical): No  Physical Activity:   . Days of Exercise per Week: Not on file  . Minutes of Exercise per Session: Not on file  Stress: No Stress Concern Present  . Feeling of Stress : Only a little  Social Connections: Unknown  . Frequency of Communication with Friends and Family: More than three times a week  . Frequency of Social Gatherings with Friends and Family: Not on file  . Attends Religious Services: Not on file  . Active Member of Clubs or Organizations: Not on file  . Attends Archivist Meetings: Not on file  . Marital Status: Divorced      Review of Systems see above; denies HA,CP, dyspnea, cough, back pain, vomiting or bleeding  Vital Signs: BP 101/85   Pulse (!) 145    Temp 98 F (36.7 C) (Oral)   Resp 17   Ht 5\' 2"  (1.575 m)   Wt 123 lb (55.8 kg)   SpO2 97%   BMI 22.50 kg/m   Physical Exam awake/alert; chest- CTA bilat; heart - currently RRR;  abd- soft,+BS, mod tender lower abd regions to palpation, no LE edema  Imaging: CT ABDOMEN  PELVIS WO CONTRAST  Result Date: 06/25/2020 CLINICAL DATA:  Clinical concern for bowel obstruction EXAM: CT ABDOMEN AND PELVIS WITHOUT CONTRAST TECHNIQUE: Multidetector CT imaging of the abdomen and pelvis was performed following the standard protocol without IV contrast. COMPARISON:  02/21/2020 FINDINGS: Lower chest: Lung bases are clear.  Small hiatal hernia. Hepatobiliary: The liver has an unremarkable unenhanced appearance. No focal liver lesion is seen. Prior cholecystectomy. Pancreas: Unremarkable. Spleen: Unremarkable. Adrenals/Urinary Tract: Unremarkable adrenal glands. Bilateral kidneys appear within normal limits. No renal stone or hydronephrosis. Urinary bladder is largely obscured by extensive metallic streak artifact within the pelvis from adjacent hip arthroplasties. Stomach/Bowel: Extensive colonic diverticulosis. Thickened appearance of the distal sigmoid colon with surrounding inflammatory changes and an adjacent slightly ill-defined fluid and air containing collection measuring approximately 6.0 x 5.2 x 4.5 cm (series 3, image 57; series 6, image 74). There are multiple mildly dilated and fluid-filled loops of small bowel throughout the abdomen. There is a loop of small bowel within the left hemiabdomen which demonstrates prominent luminal narrowing suggesting a site of focal stricture (series 3, images 39-41). Underlying mass not excluded. Vascular/Lymphatic: Aortic atherosclerosis. No enlarged abdominal or pelvic lymph nodes. Reproductive: Status post hysterectomy. No adnexal masses are evident. Other: Small amount of free fluid within the pelvis. Small volume pneumoperitoneum within the non dependent portions of  the upper abdomen (for example, series 3, images 11 and 20). Musculoskeletal: Bilateral total hip arthroplasties without apparent complication. Advanced degenerative spondylosis of the visualized thoracolumbar spine. IMPRESSION: 1. Thickened appearance of the distal sigmoid colon with surrounding inflammatory changes and an adjacent fluid and air containing collection measuring approximately 6.0 x 5.2 x 4.5 cm. There is also small volume pneumoperitoneum. Findings compatible with perforated acute sigmoid diverticulitis with adjacent diverticular abscess. Surgical consultation is recommended. 2. Multiple mildly dilated and fluid-filled loops of small bowel throughout the abdomen. Findings favored to represent reactive ileus. 3. There is a loop of small bowel within the left hemiabdomen which demonstrates prominent short segment luminal narrowing suggesting a site of focal stricture. Underlying mass not excluded. Evaluation is limited in the absence of intravenous and oral contrast. Aortic Atherosclerosis (ICD10-I70.0). Critical Value/emergent results were called by telephone at the time of interpretation on 06/25/2020 at 2:12 pm to provider JOSHUA LONG , who verbally acknowledged these results. Electronically Signed   By: Davina Poke D.O.   On: 06/25/2020 14:13   DG Chest Portable 1 View  Result Date: 06/25/2020 CLINICAL DATA:  Chest pain EXAM: PORTABLE CHEST 1 VIEW COMPARISON:  2018 FINDINGS: The heart size and mediastinal contours are within normal limits. Hyperinflation. No new consolidation or edema. No pleural effusion or pneumothorax. The visualized skeletal structures are unremarkable. IMPRESSION: No acute process in the chest. Electronically Signed   By: Macy Mis M.D.   On: 06/25/2020 12:49    Labs:  CBC: Recent Labs    02/21/20 1638 02/22/20 0342 02/23/20 0520 06/25/20 1145  WBC 13.6* 8.4 7.9 19.8*  HGB 14.6 11.5* 11.4* 13.4  HCT 40.9 33.1* 33.7* 38.9  PLT 376 261 281 444*     COAGS: No results for input(s): INR, APTT in the last 8760 hours.  BMP: Recent Labs    02/21/20 1638 02/22/20 0342 02/23/20 0520 06/25/20 1145  NA 127* 131* 133* 125*  K 3.7 3.6 3.6 4.1  CL 88* 96* 99 88*  CO2 24 24 26  21*  GLUCOSE 161* 124* 96 155*  BUN 14 14 9  25*  CALCIUM 9.3 8.1* 8.5* 8.7*  CREATININE 0.90 0.97 0.95 1.74*  GFRNONAA >60 56* 57* 27*  GFRAA >60 >60 >60  --     LIVER FUNCTION TESTS: Recent Labs    02/21/20 1638 06/25/20 1145  BILITOT 1.1 0.9  AST 23 19  ALT 18 16  ALKPHOS 78 80  PROT 7.7 6.0*  ALBUMIN 4.7 2.8*    TUMOR MARKERS: No results for input(s): AFPTM, CEA, CA199, CHROMGRNA in the last 8760 hours.  Assessment and Plan: 79 y.o. female with PMH including CKD, asthma, cervical cancer, COPD, GERD, HLD,HTN, IDDM, IBS, diverticulosis, PAF,and macular degeneration. Also with prior cholecystectomy, partial hysterectomy and appendectomy. She presented to [facility] ED today with persistent abd pain, hypotension and afib as well as intermittent temp elevations, nausea, loose stools. Subsequent imaging revealed: 1. Thickened appearance of the distal sigmoid colon with surrounding inflammatory changes and an adjacent fluid and air containing collection measuring approximately 6.0 x 5.2 x 4.5 cm. There is also small volume pneumoperitoneum. Findings compatible with perforated acute sigmoid diverticulitis with adjacent diverticular abscess. Surgical consultation is recommended. 2. Multiple mildly dilated and fluid-filled loops of small bowel throughout the abdomen. Findings favored to represent reactive ileus. 3. There is a loop of small bowel within the left hemiabdomen which demonstrates prominent short segment luminal narrowing suggesting a site of focal stricture. Underlying mass not excluded. Evaluation is limited in the absence of intravenous and oral contrast.  She is currently afebrile, WBC 19.8, hgb nl; creat 1.74, trop 20, LA nl; COVID- 19 neg;  PT/INR pending. Request now received from [facility] for image guided drainage of diverticular abscess.Imaging studies were reviewed by Dr. Kathlene Cote.Risks and benefits discussed with the patient/daughter including bleeding, infection, damage to adjacent structures, bowel perforation/fistula connection, and sepsis.  All of the patient's questions were answered, patient is agreeable to proceed. Consent signed and in chart.  Procedure scheduled for 10/14; pt on IV heparin which will reportedly be stopped at midnight tonight    Thank you for this interesting consult.  I greatly enjoyed meeting Clarisse Rodriges and look forward to participating in their care.  A copy of this report was sent to the requesting provider on this date.  Electronically Signed: D. Rowe Robert, PA-C 06/25/2020, 4:09 PM   I spent a total of  30 minutes   in face to face in clinical consultation, greater than 50% of which was counseling/coordinating care for image guided diverticular abscess drainage

## 2020-06-25 NOTE — ED Provider Notes (Signed)
Emergency Department Provider Note   I have reviewed the triage vital signs and the nursing notes.   HISTORY  Chief Complaint Hypotension and Atrial Fibrillation   HPI Jeanette Mcintosh is a 79 y.o. female with PMH reviewed below including recurrent diverticulitis and a-fib on ASA followed by Dr. Einar Gip presents to the emergency department for evaluation of hypotension. She notes feeling like her diverticulitis has returned and this AM felt her A-fib start. She recorded pulses in the 130s and took a double dose of her Metoprolol. She went to her GI office this AM and was found to be hypotensive with a-fib and transported by EMS to the ED. Patient denies any CP or SOB symptoms. Her abdomen is diffusely sore. She denies fever or chills. No radiation of symptoms or modifying factors.   Past Medical History:  Diagnosis Date  . Acute kidney injury superimposed on chronic kidney disease ([place]) 11/09/2016  . Arthritis   . Asthma   . Blood transfusion without reported diagnosis   . Cancer (HCC)    Cervical Cancer   . Cataract   . COPD (chronic obstructive pulmonary disease) ([place])   . Eczema   . GERD (gastroesophageal reflux disease)   . Hyperlipidemia   . Hypertension   . IDDM (insulin dependent diabetes mellitus)   . Irritable bowel syndrome   . Macular degeneration   . PAF (paroxysmal atrial fibrillation) ([place]) 2007  . Paroxysmal atrial fibrillation ([place]) 11/09/2016  . Paroxysmal atrial flutter ([place])   . Thyroid disease     Patient Active Problem List   Diagnosis Date Noted  . Bowel perforation ([place]) 06/25/2020  . Diverticulitis 02/22/2020  . Diverticulitis of sigmoid colon 02/21/2020  . Hypothyroidism   . Paroxysmal atrial flutter ([place])   . Macular degeneration   . Hyperlipidemia   . Hypertension associated with diabetes ([place])   . COPD (chronic obstructive pulmonary disease) ([place])   . Atrial fibrillation with RVR ([place])   . Nontraumatic subluxation of extensor tendon at  MCP joint of hand, right 04/18/2018  . Sepsis ([place]) 11/09/2016  . Type 2 diabetes mellitus with diabetic nephropathy, with [place]man [place]-term current use of insulin ([place]) 11/09/2016  . Essential hypertension 11/09/2016  . Paroxysmal atrial fibrillation ([place]) 11/09/2016  . Hyponatremia 11/09/2016  . Elevated troponin 11/09/2016  . Influenza 11/09/2016  . Acute kidney injury superimposed on chronic kidney disease ([employer]) 11/09/2016  . IRRITABLE BOWEL SYNDROME 06/17/2008  . CHOLELITHIASIS, HX OF 06/17/2008  . DIVERTICULOSIS-COLON 05/31/2008  . GERD 05/06/2008  . DIARRHEA 05/06/2008    Past Surgical History:  Procedure Laterality Date  . ABDOMINAL HYSTERECTOMY  1971   Partial  . APPENDECTOMY  1971  . CATARACT EXTRACTION  2002  . CHOLECYSTECTOMY, LAPAROSCOPIC  2011  . HEMORRHOID SURGERY  1990's   x2  . renal doppler  12/12/06  . TOTAL HIP ARTHROPLASTY  2012   Left  . TOTAL HIP ARTHROPLASTY  2002   Right  . TRIGGER FINGER RELEASE  1990's   Several surgeries.  . US ECHOCARDIOGRAPHY  01/20/12    Allergies Morphine and related, Phenergan [promethazine hcl], Ambien [zolpidem], Dicyclomine, Diphenhydramine, Erythromycin, Ivp dye [iodinated diagnostic agents], Ondansetron, Propoxyphene, Shellfish allergy, Statins, and Vitamin e  Family History  Problem Relation Age of Onset  . Basal cell carcinoma Father   . Dementia Father   . Parkinson's disease Father   . Diverticulitis Father   . Cancer Other        All (5) had Hysterectomies and high  dose Estrogen in 1950's were all diagnosed with Breast Cancer.  . Glaucoma Mother   . Allergic rhinitis Mother   . Parkinson's disease Paternal Grandmother   . Eczema Son     Social History Social History   Tobacco Use  . Smoking status: Former Smoker    Packs/day: 0.25    Years: 25.00    Pack years: 6.25    Types: Cigarettes    Start date: 09/14/1950    Quit date: 09/13/2013    Years since quitting: 6.7  . Smokeless tobacco: Never Used  . Tobacco  comment: Pt has stopped and started smoking muliple times over the years.  Amount varied during the years.  Vaping Use  . Vaping Use: Never used  Substance Use Topics  . Alcohol use: Yes    Alcohol/week: 0.0 standard drinks    Comment: 1/Evening  . Drug use: No    Review of Systems  Constitutional: No fever/chills Eyes: No visual changes. ENT: No sore throat. Cardiovascular: Denies chest pain. Positive palpitations.  Respiratory: Denies shortness of breath. Gastrointestinal: Positive abdominal pain.  No nausea, no vomiting.  Positive diarrhea.  No constipation. Genitourinary: Negative for dysuria. Musculoskeletal: Negative for back pain. Skin: Negative for rash. Neurological: Negative for headaches, focal weakness or numbness.  10-point ROS otherwise negative.  ____________________________________________   PHYSICAL EXAM:  VITAL SIGNS: Vitals:   06/25/20 2300 06/26/20 0440  BP: (!) 118/55 (!) 107/52  Pulse: 72 77  Resp: 17 16  Temp: 98.6 F (37 C) 98.6 F (37 C)  SpO2: 99% 95%    Constitutional: Alert and oriented. Well appearing and in no acute distress. Eyes: Conjunctivae are normal.  Head: Atraumatic. Nose: No congestion/rhinnorhea. Mouth/Throat: Mucous membranes are moist.  Neck: No stridor.   Cardiovascular: A-fib with RVR. Good peripheral circulation. Grossly normal heart sounds.   Respiratory: Normal respiratory effort.  No retractions. Lungs CTAB. Gastrointestinal: Soft with non-focal lower abdominal tenderness. No rebound or guarding. No distention.  Musculoskeletal: No gross deformities of extremities. Neurologic:  Normal speech and language. No gross focal neurologic deficits are appreciated.  Skin:  Skin is warm, dry and intact. No rash noted.  ____________________________________________   LABS (all labs ordered are listed, but only abnormal results are displayed)  Labs Reviewed  COMPREHENSIVE METABOLIC PANEL - Abnormal; Notable for the  following components:      Result Value   Sodium 125 (*)    Chloride 88 (*)    CO2 21 (*)    Glucose, Bld 155 (*)    BUN 25 (*)    Creatinine, Ser 1.74 (*)    Calcium 8.7 (*)    Total Protein 6.0 (*)    Albumin 2.8 (*)    GFR, Estimated 27 (*)    Anion gap 16 (*)    All other components within normal limits  CBC WITH DIFFERENTIAL/PLATELET - Abnormal; Notable for the following components:   WBC 19.8 (*)    Platelets 444 (*)    Neutro Abs 16.5 (*)    Monocytes Absolute 1.7 (*)    Abs Immature Granulocytes 0.24 (*)    All other components within normal limits  URINALYSIS, ROUTINE W REFLEX MICROSCOPIC - Abnormal; Notable for the following components:   APPearance HAZY (*)    Ketones, ur 20 (*)    Leukocytes,Ua LARGE (*)    Bacteria, UA RARE (*)    All other components within normal limits  PREALBUMIN - Abnormal; Notable for the following components:   Prealbumin 11.8 (*)  All other components within normal limits  HEMOGLOBIN A1C - Abnormal; Notable for the following components:   Hgb A1c MFr Bld 6.5 (*)    All other components within normal limits  OSMOLALITY - Abnormal; Notable for the following components:   Osmolality 271 (*)    All other components within normal limits  COMPREHENSIVE METABOLIC PANEL - Abnormal; Notable for the following components:   Sodium 130 (*)    Chloride 97 (*)    CO2 21 (*)    Glucose, Bld 106 (*)    BUN 24 (*)    Creatinine, Ser 1.61 (*)    Calcium 8.3 (*)    Total Protein 5.3 (*)    Albumin 2.4 (*)    GFR, Estimated 30 (*)    All other components within normal limits  GLUCOSE, CAPILLARY - Abnormal; Notable for the following components:   Glucose-Capillary 112 (*)    All other components within normal limits  CBG MONITORING, ED - Abnormal; Notable for the following components:   Glucose-Capillary 137 (*)    All other components within normal limits  TROPONIN I (HIGH SENSITIVITY) - Abnormal; Notable for the following components:    Troponin I (High Sensitivity) 20 (*)    All other components within normal limits  TROPONIN I (HIGH SENSITIVITY) - Abnormal; Notable for the following components:   Troponin I (High Sensitivity) 33 (*)    All other components within normal limits  RESPIRATORY PANEL BY RT PCR (FLU A&B, COVID)  URINE CULTURE  LIPASE, BLOOD  LACTIC ACID, PLASMA  LACTIC ACID, PLASMA  TSH  SODIUM, URINE, RANDOM  OSMOLALITY, URINE  BASIC METABOLIC PANEL  CBC  CBG MONITORING, ED   ____________________________________________  EKG   EKG Interpretation  Date/Time:  Wednesday June 25 2020 11:55:06 EDT Ventricular Rate:  141 PR Interval:    QRS Duration: 86 QT Interval:  295 QTC Calculation: 452 R Axis:   76 Text Interpretation: Atrial fibrillation ST depression, probably rate related No STEMI Confirmed by Nanda Quinton [phone]) on 06/25/2020 12:07:16 PM       ____________________________________________  RADIOLOGY  CT ABDOMEN PELVIS WO CONTRAST  Result Date: 06/25/2020 CLINICAL DATA:  Clinical concern for bowel obstruction EXAM: CT ABDOMEN AND PELVIS WITHOUT CONTRAST TECHNIQUE: Multidetector CT imaging of the abdomen and pelvis was performed following the standard protocol without IV contrast. COMPARISON:  02/21/2020 FINDINGS: Lower chest: Lung bases are clear.  Small hiatal hernia. Hepatobiliary: The liver has an unremarkable unenhanced appearance. No focal liver lesion is seen. Prior cholecystectomy. Pancreas: Unremarkable. Spleen: Unremarkable. Adrenals/Urinary Tract: Unremarkable adrenal glands. Bilateral kidneys appear within normal limits. No renal stone or hydronephrosis. Urinary bladder is largely obscured by extensive metallic streak artifact within the pelvis from adjacent hip arthroplasties. Stomach/Bowel: Extensive colonic diverticulosis. Thickened appearance of the distal sigmoid colon with surrounding inflammatory changes and an adjacent slightly ill-defined fluid and air containing  collection measuring approximately 6.0 x 5.2 x 4.5 cm (series 3, image 57; series 6, image 74). There are multiple mildly dilated and fluid-filled loops of small bowel throughout the abdomen. There is a loop of small bowel within the left hemiabdomen which demonstrates prominent luminal narrowing suggesting a site of focal stricture (series 3, images 39-41). Underlying mass not excluded. Vascular/Lymphatic: Aortic atherosclerosis. No enlarged abdominal or pelvic lymph nodes. Reproductive: Status post hysterectomy. No adnexal masses are evident. Other: Small amount of free fluid within the pelvis. Small volume pneumoperitoneum within the non dependent portions of the upper abdomen (for example, series 3,  images 11 and 20). Musculoskeletal: Bilateral total hip arthroplasties without apparent complication. Advanced degenerative spondylosis of the visualized thoracolumbar spine. IMPRESSION: 1. Thickened appearance of the distal sigmoid colon with surrounding inflammatory changes and an adjacent fluid and air containing collection measuring approximately 6.0 x 5.2 x 4.5 cm. There is also small volume pneumoperitoneum. Findings compatible with perforated acute sigmoid diverticulitis with adjacent diverticular abscess. Surgical consultation is recommended. 2. Multiple mildly dilated and fluid-filled loops of small bowel throughout the abdomen. Findings favored to represent reactive ileus. 3. There is a loop of small bowel within the left hemiabdomen which demonstrates prominent short segment luminal narrowing suggesting a site of focal stricture. Underlying mass not excluded. Evaluation is limited in the absence of intravenous and oral contrast. Aortic Atherosclerosis (ICD10-I70.0). Critical Value/emergent results were called by telephone at the time of interpretation on 06/25/2020 at 2:12 pm to provider Robertson Colclough , who verbally acknowledged these results. Electronically Signed   By: Davina Poke D.O.   On:  06/25/2020 14:13   DG Chest Portable 1 View  Result Date: 06/25/2020 CLINICAL DATA:  Chest pain EXAM: PORTABLE CHEST 1 VIEW COMPARISON:  2018 FINDINGS: The heart size and mediastinal contours are within normal limits. Hyperinflation. No new consolidation or edema. No pleural effusion or pneumothorax. The visualized skeletal structures are unremarkable. IMPRESSION: No acute process in the chest. Electronically Signed   By: Macy Mis M.D.   On: 06/25/2020 12:49    ____________________________________________   PROCEDURES  Procedure(s) performed:   .Critical Care Performed by: Margette Fast, MD Authorized by: Margette Fast, MD   Critical care provider statement:    Critical care time (minutes):  45   Critical care time was exclusive of:  Separately billable procedures and treating other patients and teaching time   Critical care was necessary to treat or prevent imminent or life-threatening deterioration of the following conditions:  Circulatory failure and shock   Critical care was time spent personally by me on the following activities:  Discussions with consultants, evaluation of patient's response to treatment, examination of patient, ordering and performing treatments and interventions, ordering and review of laboratory studies, ordering and review of radiographic studies, pulse oximetry, re-evaluation of patient's condition, obtaining history from patient or surrogate, review of old charts, blood draw for specimens and development of treatment plan with patient or surrogate   I assumed direction of critical care for this patient from another provider in my specialty: no       ____________________________________________   INITIAL IMPRESSION / ASSESSMENT AND PLAN / ED COURSE  Pertinent labs & imaging results that were available during my care of the patient were reviewed by me and considered in my medical decision making (see chart for details).   Patient presents to the  emergency department atrial fibrillation with RVR and hypotension.  And transitioning the patient over to our monitoring her blood pressures are in the low 614E to 315Q systolic.  Her heart rates in the 130s to 140s with A. Fib/RVR.  She has diffuse abdominal tenderness with more focal discomfort in the lower abdomen.  Plan for CT abdomen pelvis to rule out complicated diverticulitis.  Will give diltiazem infusion.  Patient takes aspirin but is not anticoagulated.   02:20 PM  Spoke with Radiology regarding the CT findings. Starting Zosyn with perforated diverticulitis with free air. Last PO was last night. Will continue IVF. Patient is tolerating the A fib well and this is likely compensatory. Will not aggressively rate control  at this time. Surgery team paged. Patient and family updated.   02:35 PM  Spoke with General Surgery. They will consult. Doubt immediate surgery but will need to see for better assessment. See note to follow. Will Discuss with her Cardiology team and medicine team for admit.   02:53 PM  Spoke with Cardiology, Dr. Virgina Jock with [facility]. They will consult. Agrees with no cardioversion. Recommends heparin infusion and amiodarone infusion which were ordered.   Discussed patient's case with medicine to request admission. Patient and family (if present) updated with plan. Care transferred to medicine service.  I reviewed all nursing notes, vitals, pertinent old records, EKGs, labs, imaging (as available).  ____________________________________________  FINAL CLINICAL IMPRESSION(S) / ED DIAGNOSES  Final diagnoses:  Diverticulitis of large intestine with perforation and abscess without bleeding  Atrial fibrillation with RVR ([place])  Hyponatremia  AKI (acute kidney injury) ([place])     MEDICATIONS GIVEN DURING THIS VISIT:  Medications  lactated ringers infusion ( Intravenous Rate/Dose Change 06/26/20 0251)  piperacillin-tazobactam (ZOSYN) IVPB 3.375 g (3.375 g  Intravenous New Bag/Given 06/26/20 0504)  insulin aspart (novoLOG) injection 0-9 Units (0 Units Subcutaneous Not Given 06/26/20 0629)  insulin aspart (novoLOG) injection 0-5 Units (0 Units Subcutaneous Not Given 06/25/20 2115)  sodium chloride 0.9 % bolus 500 mL (0 mLs Intravenous Stopped 06/25/20 1306)  ondansetron (ZOFRAN) injection 4 mg (4 mg Intravenous Given 06/25/20 1306)  piperacillin-tazobactam (ZOSYN) IVPB 3.375 g ( Intravenous Stopped 06/25/20 1447)  fentaNYL (SUBLIMAZE) injection 25 mcg (25 mcg Intravenous Given 06/25/20 1641)  sodium chloride 0.9 % bolus 1,000 mL (0 mLs Intravenous Stopped 06/25/20 1840)  HYDROmorphone (DILAUDID) injection 0.5 mg (0.5 mg Intravenous Given 06/25/20 2149)  HYDROmorphone (DILAUDID) injection 0.5 mg (0.5 mg Intravenous Given 06/26/20 0710)    Note:  This document was prepared using Dragon voice recognition software and may include unintentional dictation errors.  Nanda Quinton, MD, [facility] Emergency Medicine    Modine Oppenheimer, [facility], MD 06/26/20 [phone]

## 2020-06-25 NOTE — ED Triage Notes (Signed)
Pt arrived to ED via [facility] from PCP. Pt reports feeling her heart racing this morning and took a double dose of her metoprolol and 405mg  ASA. Pt found to be hypotensive at PCP and EMS was called. Pt in A-fib RVR and hypotensive with SBP ranging from 78 -102. EMS gave 517mL NS.

## 2020-06-25 NOTE — ED Notes (Signed)
Per admitting hold amio and hep

## 2020-06-25 NOTE — Consult Note (Signed)
CARDIOLOGY CONSULT NOTE  Patient ID: Jeanette Mcintosh MRN: [id number] DOB/AGE: 20-Apr-1941 79 y.o.  Admit date: 06/25/2020 Referring Physician: [facility] Reason for Consultation:  Atrial fibrillation  HPI:   79 y.o. Caucasian female  with hypertension, hyperlipidemia, type 2 DM, paroxysmal atrial flutter, admitted with abdominal pain, found to have perforated sigmoid diverticulitis with abscess, questionable small bowel stricture. Cardiology consulted for management of Afib with RVR.   Patient was started on cardizem drip in the ED, following which her BP was low. Cardizem drip was thus stopped. I had initially recommended IV amiodarone and heparin. However on my arrival, patient was already in sinus rhythm. She denies any chest pain, shortness of breath.    Management plan from surgical standpoint ir conservative at this time, with possibility of elective surgery later.   Past Medical History:  Diagnosis Date  . Acute kidney injury superimposed on chronic kidney disease ([place]) 11/09/2016  . Arthritis   . Asthma   . Blood transfusion without reported diagnosis   . Cancer (HCC)    Cervical Cancer   . Cataract   . COPD (chronic obstructive pulmonary disease) ([place])   . Eczema   . GERD (gastroesophageal reflux disease)   . Hyperlipidemia   . Hypertension   . IDDM (insulin dependent diabetes mellitus)   . Irritable bowel syndrome   . Macular degeneration   . PAF (paroxysmal atrial fibrillation) ([place]) 2007  . Paroxysmal atrial fibrillation ([place]) 11/09/2016  . Paroxysmal atrial flutter ([place])   . Thyroid disease      Past Surgical History:  Procedure Laterality Date  . ABDOMINAL HYSTERECTOMY  1971   Partial  . APPENDECTOMY  1971  . CATARACT EXTRACTION  2002  . CHOLECYSTECTOMY, LAPAROSCOPIC  2011  . HEMORRHOID SURGERY  1990's   x2  . renal doppler  12/12/06  . TOTAL HIP ARTHROPLASTY  2012   Left  . TOTAL HIP ARTHROPLASTY  2002   Right  . TRIGGER FINGER RELEASE  1990's    Several surgeries.  . US ECHOCARDIOGRAPHY  01/20/12     Family History  Problem Relation Age of Onset  . Basal cell carcinoma Father   . Dementia Father   . Parkinson's disease Father   . Diverticulitis Father   . Cancer Other        All (5) had Hysterectomies and high dose Estrogen in 1950's were all diagnosed with Breast Cancer.  . Glaucoma Mother   . Allergic rhinitis Mother   . Parkinson's disease Paternal Grandmother   . Eczema Son      Social History: Social History   Socioeconomic History  . Marital status: Divorced    Spouse name: Not on file  . Number of children: 6  . Years of education: Not on file  . Highest education level: Not on file  Occupational History  . Occupation: retired    Comment: retired Engineer, water  Tobacco Use  . Smoking status: Former Smoker    Packs/day: 0.25    Years: 25.00    Pack years: 6.25    Types: Cigarettes    Start date: 09/14/1950    Quit date: 09/13/2013    Years since quitting: 6.7  . Smokeless tobacco: Never Used  . Tobacco comment: Pt has stopped and started smoking muliple times over the years.  Amount varied during the years.  Vaping Use  . Vaping Use: Never used  Substance and Sexual Activity  . Alcohol use: Yes    Alcohol/week: 0.0 standard drinks  Comment: 1/Evening  . Drug use: No  . Sexual activity: Not on file  Other Topics Concern  . Not on file  Social History Narrative   Lives alone.   Originally from [place].   Retired Engineer, water.   Scientist, physiological Strain: [place]   . Difficulty of Paying Living Expenses: Not hard at all  Food Insecurity: No Food Insecurity  . Worried About Charity fundraiser in the Last Year: Never true  . Ran Out of Food in the Last Year: Never true  Transportation Needs: No Transportation Needs  . Lack of Transportation (Medical): No  . Lack of Transportation (Non-Medical): No  Physical Activity:   . Days of Exercise per Week: Not on file  .  Minutes of Exercise per Session: Not on file  Stress: No Stress Concern Present  . Feeling of Stress : Only a little  Social Connections: Unknown  . Frequency of Communication with Friends and Family: More than three times a week  . Frequency of Social Gatherings with Friends and Family: Not on file  . Attends Religious Services: Not on file  . Active Member of Clubs or Organizations: Not on file  . Attends Archivist Meetings: Not on file  . Marital Status: Divorced  Human resources officer Violence:   . Fear of Current or Ex-Partner: Not on file  . Emotionally Abused: Not on file  . Physically Abused: Not on file  . Sexually Abused: Not on file     (Not in a hospital admission)   Review of Systems  Constitutional: Negative for decreased appetite, malaise/fatigue, weight gain and weight loss.  HENT: Negative for congestion.   Eyes: Negative for visual disturbance.  Cardiovascular: Negative for chest pain, dyspnea on exertion, leg swelling, palpitations and syncope.  Respiratory: Negative for cough.   Endocrine: Negative for cold intolerance.  Hematologic/Lymphatic: Does not bruise/bleed easily.  Skin: Negative for itching and rash.  Musculoskeletal: Negative for myalgias.  Gastrointestinal: Positive for abdominal pain. Negative for nausea and vomiting.  Genitourinary: Negative for dysuria.  Neurological: Negative for dizziness and weakness.  Psychiatric/Behavioral: The patient is not nervous/anxious.   All other systems reviewed and are negative.     Physical Exam: Physical Exam Vitals and nursing note reviewed.  Constitutional:      General: She is not in acute distress.    Appearance: She is well-developed.  HENT:     Head: Normocephalic and atraumatic.  Eyes:     Conjunctiva/sclera: Conjunctivae normal.     Pupils: Pupils are equal, round, and reactive to light.  Neck:     Vascular: No JVD.  Cardiovascular:     Rate and Rhythm: Normal rate and regular  rhythm.     Pulses: Normal pulses and intact distal pulses.     Heart sounds: No murmur heard.   Pulmonary:     Effort: Pulmonary effort is normal.     Breath sounds: Normal breath sounds. No wheezing or rales.  Abdominal:     General: Bowel sounds are normal.     Palpations: Abdomen is soft.     Tenderness: There is abdominal tenderness. There is no rebound.  Musculoskeletal:        General: No tenderness. Normal range of motion.     Left lower leg: No edema.  Lymphadenopathy:     Cervical: No cervical adenopathy.  Skin:    General: Skin is warm and dry.  Neurological:     Mental  Status: She is alert and oriented to person, place, and time.     Cranial Nerves: No cranial nerve deficit.      Labs:   Lab Results  Component Value Date   WBC 19.8 (H) 06/25/2020   HGB 13.4 06/25/2020   HCT 38.9 06/25/2020   MCV 92.8 06/25/2020   PLT 444 (H) 06/25/2020    Recent Labs  Lab 06/25/20 1145  NA 125*  K 4.1  CL 88*  CO2 21*  BUN 25*  CREATININE 1.74*  CALCIUM 8.7*  PROT 6.0*  BILITOT 0.9  ALKPHOS 80  ALT 16  AST 19  GLUCOSE 155*    Lipid Panel     Component Value Date/Time   CHOL 212 (H) 05/01/2019 1044   TRIG 120.0 05/01/2019 1044   HDL 54.80 05/01/2019 1044   CHOLHDL 4 05/01/2019 1044   VLDL 24.0 05/01/2019 1044   LDLCALC 134 (H) 05/01/2019 1044    BNP (last 3 results) No results for input(s): BNP in the last 8760 hours.  HEMOGLOBIN A1C Lab Results  Component Value Date   HGBA1C 6.6 (H) 02/21/2020   MPG 142.72 02/21/2020   TSH Recent Labs    06/25/20 1608  TSH 0.532      Radiology: CT ABDOMEN PELVIS WO CONTRAST  Result Date: 06/25/2020 CLINICAL DATA:  Clinical concern for bowel obstruction EXAM: CT ABDOMEN AND PELVIS WITHOUT CONTRAST TECHNIQUE: Multidetector CT imaging of the abdomen and pelvis was performed following the standard protocol without IV contrast. COMPARISON:  02/21/2020 FINDINGS: Lower chest: Lung bases are clear.  Small hiatal  hernia. Hepatobiliary: The liver has an unremarkable unenhanced appearance. No focal liver lesion is seen. Prior cholecystectomy. Pancreas: Unremarkable. Spleen: Unremarkable. Adrenals/Urinary Tract: Unremarkable adrenal glands. Bilateral kidneys appear within normal limits. No renal stone or hydronephrosis. Urinary bladder is largely obscured by extensive metallic streak artifact within the pelvis from adjacent hip arthroplasties. Stomach/Bowel: Extensive colonic diverticulosis. Thickened appearance of the distal sigmoid colon with surrounding inflammatory changes and an adjacent slightly ill-defined fluid and air containing collection measuring approximately 6.0 x 5.2 x 4.5 cm (series 3, image 57; series 6, image 74). There are multiple mildly dilated and fluid-filled loops of small bowel throughout the abdomen. There is a loop of small bowel within the left hemiabdomen which demonstrates prominent luminal narrowing suggesting a site of focal stricture (series 3, images 39-41). Underlying mass not excluded. Vascular/Lymphatic: Aortic atherosclerosis. No enlarged abdominal or pelvic lymph nodes. Reproductive: Status post hysterectomy. No adnexal masses are evident. Other: Small amount of free fluid within the pelvis. Small volume pneumoperitoneum within the non dependent portions of the upper abdomen (for example, series 3, images 11 and 20). Musculoskeletal: Bilateral total hip arthroplasties without apparent complication. Advanced degenerative spondylosis of the visualized thoracolumbar spine. IMPRESSION: 1. Thickened appearance of the distal sigmoid colon with surrounding inflammatory changes and an adjacent fluid and air containing collection measuring approximately 6.0 x 5.2 x 4.5 cm. There is also small volume pneumoperitoneum. Findings compatible with perforated acute sigmoid diverticulitis with adjacent diverticular abscess. Surgical consultation is recommended. 2. Multiple mildly dilated and fluid-filled  loops of small bowel throughout the abdomen. Findings favored to represent reactive ileus. 3. There is a loop of small bowel within the left hemiabdomen which demonstrates prominent short segment luminal narrowing suggesting a site of focal stricture. Underlying mass not excluded. Evaluation is limited in the absence of intravenous and oral contrast. Aortic Atherosclerosis (ICD10-I70.0). Critical Value/emergent results were called by telephone at the time of interpretation  on 06/25/2020 at 2:12 pm to provider JOSHUA LONG , who verbally acknowledged these results. Electronically Signed   By: Davina Poke D.O.   On: 06/25/2020 14:13   DG Chest Portable 1 View  Result Date: 06/25/2020 CLINICAL DATA:  Chest pain EXAM: PORTABLE CHEST 1 VIEW COMPARISON:  2018 FINDINGS: The heart size and mediastinal contours are within normal limits. Hyperinflation. No new consolidation or edema. No pleural effusion or pneumothorax. The visualized skeletal structures are unremarkable. IMPRESSION: No acute process in the chest. Electronically Signed   By: Macy Mis M.D.   On: 06/25/2020 12:49    Scheduled Meds: . insulin aspart  0-5 Units Subcutaneous QHS  . insulin aspart  0-9 Units Subcutaneous TID WC   Continuous Infusions: . lactated ringers 150 mL/hr at 06/25/20 1418  . piperacillin-tazobactam (ZOSYN)  IV     PRN Meds:.  CARDIAC STUDIES:  EKG 06/25/2020 16:19 Sinus rhythm Right atrial enlargmeent  EKG 06/25/2020 11:55 Afib w/RVR 141 bpm Nonspecific ST depression   Echocardiogram 2018:  - Left ventricle: The cavity size was normal. Wall thickness was  normal. Systolic function was normal. The estimated ejection  fraction was in the range of 60% to 65%. Wall motion was normal;  there were no regional wall motion abnormalities. Left  ventricular diastolic function parameters were normal.  - Left atrium: The atrium was normal in size.  - Right atrium: The atrium was normal in size.  -  Atrial septum: No defect or patent foramen ovale was identified.  - Tricuspid valve: There was mild regurgitation.  - Pulmonary arteries: PA peak pressure: 35 mm Hg (S).  - Inferior vena cava: The vessel was normal in size. The  respirophasic diameter changes were in the normal range (>= 50%),  consistent with normal central venous pressure.   Impressions:   - Compared to a prior echo in 09/2015, the LVEF is stable at 60-65%.  Surprisingly, diastolic function appears normal. Normal biatrial  size. The patient is in sinus rhythm.    Assessment & Recommendations:  79 y.o. Caucasian female  with hypertension, hyperlipidemia, type 2 DM, paroxysmal atrial flutter, admitted with abdominal pain, found to have perforated sigmoid diverticulitis with abscess, questionable small bowel stricture. Cardiology consulted for management of Afib with RVR.   Paroxysmal Afib w/RVR: Now converted to sinus rhythm. Likely triggered by acute medical issues.  Hold amiodarone. Ok to continue home medication amiodarone. CHA2DS2VASc score 4, annual stroke risk 4.8%. If okay with surgical team, recommend IV heparin. On discharge, consider eliquis 2.5 mg bid.  Recommend echcoardiogram    Nigel Mormon, MD Pager: [phone] Office: [phone]

## 2020-06-25 NOTE — Progress Notes (Signed)
  Amiodarone Drug - Drug Interaction Consult Note  Recommendations: d/c diltiazem drip and start amiodarone gtt load (due to increased risk of bradycardia and cardiac depression with the combination of these two drugs given together.)    Amiodarone is metabolized by the cytochrome P450 system and therefore has the potential to cause many drug interactions. Amiodarone has an average plasma half-life of 50 days (range 20 to 100 days).   There is potential for drug interactions to occur several weeks or months after stopping treatment and the onset of drug interactions may be slow after initiating amiodarone.   []  Statins: Increased risk of myopathy. Simvastatin- restrict dose to 20mg  daily. Other statins: counsel patients to report any muscle pain or weakness immediately.  []  Anticoagulants: Amiodarone can increase anticoagulant effect. Consider warfarin dose reduction. Patients should be monitored closely and the dose of anticoagulant altered accordingly, remembering that amiodarone levels take several weeks to stabilize.  []  Antiepileptics: Amiodarone can increase plasma concentration of phenytoin, the dose should be reduced. Note that small changes in phenytoin dose can result in large changes in levels. Monitor patient and counsel on signs of toxicity.  []  Beta blockers: increased risk of bradycardia, AV block and myocardial depression. Sotalol - avoid concomitant use.  [x]   Calcium channel blockers (diltiazem and verapamil): increased risk of bradycardia, AV block and myocardial depression.  []   Cyclosporine: Amiodarone increases levels of cyclosporine. Reduced dose of cyclosporine is recommended.  []  Digoxin dose should be halved when amiodarone is started.  []  Diuretics: increased risk of cardiotoxicity if hypokalemia occurs.  []  Oral hypoglycemic agents (glyburide, glipizide, glimepiride): increased risk of hypoglycemia. Patient's glucose levels should be monitored closely when  initiating amiodarone therapy.   []  Drugs that prolong the QT interval:  Torsades de pointes risk may be increased with concurrent use - avoid if possible.  Monitor QTc, also keep magnesium/potassium WNL if concurrent therapy can't be avoided. Marland Kitchen Antibiotics: e.g. fluoroquinolones, erythromycin. . Antiarrhythmics: e.g. quinidine, procainamide, disopyramide, sotalol. . Antipsychotics: e.g. phenothiazines, haloperidol.  . Lithium, tricyclic antidepressants, and methadone.   Thank You,   Carolin Guernsey   PGY1 Pharmacy Resident 06/25/2020 3:09 PM

## 2020-06-25 NOTE — Progress Notes (Signed)
ANTICOAGULATION CONSULT NOTE - Initial Consult  Pharmacy Consult for heparin gtt Indication: atrial fibrillation  Allergies  Allergen Reactions  . Morphine And Related Nausea Only  . Phenergan [Promethazine Hcl] Other (See Comments)    Restless leg  . Ambien [Zolpidem] Other (See Comments)    Unknown  . Dicyclomine Other (See Comments)    constipation Other reaction(s): GI Upset (intolerance) constipation  . Diphenhydramine Other (See Comments)    Restless leg Restless leg  . Erythromycin Nausea And Vomiting  . Ivp Dye [Iodinated Diagnostic Agents] Nausea And Vomiting  . Ondansetron Other (See Comments)    heartburn  . Propoxyphene Other (See Comments)    Unknown  . Shellfish Allergy Nausea And Vomiting  . Statins Other (See Comments)    Unknown  . Vitamin E Other (See Comments)    Hot flashes    Patient Measurements: Height: 5\' 2"  (157.5 cm) Weight: 55.8 kg (123 lb) IBW/kg (Calculated) : 50.1 Heparin Dosing Weight: 55.8kg  Vital Signs: Temp: 98 F (36.7 C) (10/13 1152) Temp Source: Oral (10/13 1152) BP: 101/85 (10/13 1415) Pulse Rate: 145 (10/13 1415)  Labs: Recent Labs    06/25/20 1145 06/25/20 1354  HGB 13.4  --   HCT 38.9  --   PLT 444*  --   CREATININE 1.74*  --   TROPONINIHS 20* 33*    Estimated Creatinine Clearance: 20.7 mL/min (A) (by C-G formula based on SCr of 1.74 mg/dL (H)).   Medical History: Past Medical History:  Diagnosis Date  . Acute kidney injury superimposed on chronic kidney disease ([place]) 11/09/2016  . Arthritis   . Asthma   . Blood transfusion without reported diagnosis   . Cancer (HCC)    Cervical Cancer   . Cataract   . COPD (chronic obstructive pulmonary disease) ([place])   . Eczema   . GERD (gastroesophageal reflux disease)   . Hyperlipidemia   . Hypertension   . IDDM (insulin dependent diabetes mellitus)   . Irritable bowel syndrome   . Macular degeneration   . PAF (paroxysmal atrial fibrillation) ([place]) 2007  .  Paroxysmal atrial fibrillation ([place]) 11/09/2016  . Paroxysmal atrial flutter ([place])   . Thyroid disease      Assessment: 79 year old female presenting with heart racing and palpitations with known history of afib (on ASA PTA). Pt was found to be in afib. Pharmacy has been consulted to dose heparin. No anticoagulants were reported on med history. Per chart review, patient did not want to be on anticoagulants for afib at recent doctor appts.    CBC WNL; Hgb 13.4; Plt 444.   Goal of Therapy:  Heparin level 0.3-0.7 units/ml Monitor platelets by anticoagulation protocol: Yes   Plan:  Heparin bolus 2750 units IV x1 followed by Heparin 750 units/hr 8h HL at 0000 on 10/14   Monitor s/sx bleeding, CBC, HL, and clinical progress    Carolin Guernsey  PGY1 pharmacy resident 06/25/2020,2:58 PM

## 2020-06-25 NOTE — ED Notes (Signed)
Bladder scan completed: volume 164mL, MD notified.

## 2020-06-25 NOTE — H&P (Addendum)
Date: 06/25/2020               Patient Name:  Jeanette Mcintosh MRN: [id number]  DOB: 06/17/1941 Age / Sex: 79 y.o., female   PCP: Buzzy Han, MD         Medical Service: Internal Medicine Teaching Service         Attending Physician: Dr. Aldine Contes, MD    First Contact: Dr. Candie Chroman Pager: [phone]  Second Contact: Dr. Court Joy Pager: [phone]       After Hours (After 5p/  First Contact Pager: [phone]  weekends / holidays): Second Contact Pager: [phone]   Chief Complaint: Abdominal pain  History of Present Illness: Jeanette Mcintosh is a 79 year old female living with macular degeneration, CKD stage II ,COPD not on inhalers, IDDM, HTN, HLD, diverticulitis and paraoxysmal atrial fibrillation/a flutter who presented for evaluation of abdominal pain.  She has a history of diverticulitis and reports her pain started last Thursday.  She was at her GI doctor, Dr. Almyra Free, this morning and was found to be in atrial fibrillation with hypotension.  She also so reports taking double her metoprolol dose this morning.   She reports being hospitalized this past year for diverticulitis.  She has had fever to 101 at home, chills, decreased appetite, and nausea.  Denies any hematochezia or hematemesis.   In the ED patient was sent for CT abdomen and found to have perforated diverticulitis with free air.  Surgery was consulted. Patient also in atrial fibrillation and Dr.Patwarhan with [facility] Cardiology consulted.    Meds:  Current Meds  Medication Sig  . aspirin EC 81 MG tablet Take 81 mg by mouth daily.  Marland Kitchen b complex vitamins tablet Take 1 tablet by mouth daily.  . carboxymethylcellulose (REFRESH PLUS) 0.5 % SOLN Place 1 drop into both eyes 3 (three) times daily as needed (dry eyes).  . cetirizine (ZYRTEC) 10 MG tablet Take 10 mg by mouth daily.  . Cholecalciferol (VITAMIN D PO) Take 4,000 Units by mouth daily.   . clonazePAM (KLONOPIN) 1 MG tablet TAKE 1/2 TO 3/4 TABLET AT  NIGHT BEFORE BEDTIME FOR ANXIETY AND SLEEP (Patient taking differently: Take 0.5 mg by mouth at bedtime. )  . FOLIC ACID PO Take 854 mcg by mouth daily.   Marland Kitchen guaiFENesin (MUCINEX) 600 MG 12 hr tablet Take 1,200 mg by mouth 2 (two) times daily.  . hydrochlorothiazide (HYDRODIURIL) 12.5 MG tablet Take 12.5 mg by mouth daily.  . insulin glargine (LANTUS) 100 UNIT/ML injection USE 12-20 UNITS DAILY AS DIRECTED (Patient taking differently: Inject 8-20 Units into the skin at bedtime. Takes only if blood sugar more than 150.)  . latanoprost (XALATAN) 0.005 % ophthalmic solution Place 1 drop into both eyes at bedtime.   Marland Kitchen levothyroxine (SYNTHROID) 112 MCG tablet Take 112 mcg by mouth daily.  Marland Kitchen losartan (COZAAR) 100 MG tablet Take 1 tablet by mouth daily.  **DUE FOR YEARLY PHYSICAL**  . metoprolol tartrate (LOPRESSOR) 25 MG tablet TAKE 1 AND 1/2 TABLETS(37.5 MG) BY MOUTH TWICE DAILY (Patient taking differently: Take 37.5 mg by mouth 2 (two) times daily. TAKE 1 AND 1/2 TABLETS(37.5 MG) BY MOUTH TWICE DAILY)  . nitrofurantoin (MACRODANTIN) 100 MG capsule Take 100 mg by mouth daily.  . NON FORMULARY Take 11 g by mouth daily. [place]  . ondansetron (ZOFRAN) 4 MG tablet Take 1 tablet (4 mg total) by mouth every 8 (eight) hours as needed for nausea or vomiting.  Marland Kitchen oxymetazoline (AFRIN) 0.05 %  nasal spray Place 1 spray into both nostrils 2 (two) times daily as needed for congestion.  Marland Kitchen RUTIN PO Take 500 mg by mouth daily.   . TURMERIC PO Take 1,500 mg by mouth in the morning and at bedtime. With Black Pepper and Boswellia  . VITAMIN A PO Take 10,000 Units by mouth daily.   . vitamin C (ASCORBIC ACID) 250 MG tablet Take 250 mg by mouth daily.  . [DISCONTINUED] levothyroxine (SYNTHROID) 125 MCG tablet Take 1 tablet (125 mcg total) by mouth daily before breakfast.     Allergies: Allergies as of 06/25/2020 - Review Complete 06/25/2020  Allergen Reaction Noted  . Morphine and related Nausea Only 10/18/2012  .  Phenergan [promethazine hcl] Other (See Comments) 10/18/2012  . Ambien [zolpidem] Other (See Comments) 07/29/2015  . Dicyclomine Other (See Comments) 07/29/2015  . Diphenhydramine Other (See Comments) 02/13/2016  . Erythromycin Nausea And Vomiting   . Ivp dye [iodinated diagnostic agents] Nausea And Vomiting 07/29/2015  . Ondansetron Other (See Comments) 07/29/2015  . Propoxyphene Other (See Comments) 07/29/2015  . Shellfish allergy Nausea And Vomiting 07/29/2015  . Statins Other (See Comments) 07/29/2015  . Vitamin e Other (See Comments) 07/29/2015   Past Medical History:  Diagnosis Date  . Acute kidney injury superimposed on chronic kidney disease ([place]) 11/09/2016  . Arthritis   . Asthma   . Blood transfusion without reported diagnosis   . Cancer (HCC)    Cervical Cancer   . Cataract   . COPD (chronic obstructive pulmonary disease) ([place])   . Eczema   . GERD (gastroesophageal reflux disease)   . Hyperlipidemia   . Hypertension   . IDDM (insulin dependent diabetes mellitus)   . Irritable bowel syndrome   . Macular degeneration   . PAF (paroxysmal atrial fibrillation) ([place]) 2007  . Paroxysmal atrial fibrillation ([place]) 11/09/2016  . Paroxysmal atrial flutter ([place])   . Thyroid disease     Family History:  Family History  Problem Relation Age of Onset  . Basal cell carcinoma Father   . Dementia Father   . Parkinson's disease Father   . Diverticulitis Father   . Cancer Other        All (5) had Hysterectomies and high dose Estrogen in 1950's were all diagnosed with Breast Cancer.  . Glaucoma Mother   . Allergic rhinitis Mother   . Parkinson's disease Paternal Grandmother   . Eczema Son      Social History:  Social History   Tobacco Use  . Smoking status: Former Smoker    Packs/day: 0.25    Years: 25.00    Pack years: 6.25    Types: Cigarettes    Start date: 09/14/1950    Quit date: 09/13/2013    Years since quitting: 6.7  . Smokeless tobacco: Never Used  . Tobacco  comment: Pt has stopped and started smoking muliple times over the years.  Amount varied during the years.  Vaping Use  . Vaping Use: Never used  Substance Use Topics  . Alcohol use: Yes    Alcohol/week: 0.0 standard drinks    Comment: 1/Evening  . Drug use: No     Review of Systems: A complete ROS was negative except as per HPI.   Physical Exam: Blood pressure 127/69, pulse 77, temperature 98 F (36.7 C), temperature source Oral, resp. rate 13, height 5\' 2"  (1.575 m), weight 55.8 kg, SpO2 98 %.  General: Elderly female lying flat in bed HE: Normocephalic, atraumatic , EOMI, Conjunctivae normal  ENT: No congestion, no rhinorrhea, no exudate or erythema  Cardiovascular: pulse irregular irregular with RVR  No murmurs, rubs, or gallops Pulmonary : Effort normal, breath sounds normal. No wheezes, rales, or rhonchi Abdominal: Soft,non distended,  hypoactive bowel sounds Patient had right lower quadrant tenderness, left lower quadrant rebound tenderness Musculoskeletal: no swelling , deformity, injury ,or tenderness in extremities, Skin: Warm, dry , no bruising, erythema, or rash Psychiatric/Behavioral:  normal mood, normal behavior    EKG: personally reviewed my interpretation is in atrial fibrillation  CXR: personally reviewed my interpretation is no focal consolidations , plural effusion, or skeletal abnormalities.   Assessment & Plan by Problem: Principal Problem:   Bowel perforation ([place]) Active Problems:   Essential hypertension   Paroxysmal atrial fibrillation (HCC)   Hyponatremia   Hypothyroidism   Acute kidney injury superimposed on chronic kidney disease ([place])  Jeanette Mcintosh is a 79 year old female living with macular degeneration, CKD stage II ,COPD not on inhalers, IDDM, HTN, HLD, diverticulitis and paraoxysmal atrial fibrillation/a flutter who presented for evaluation of abdominal pain and found to have a perforated diverticulitis on CT Abdomen.  #Acute sigmoid  diverticulitis with perforation and abscess CT shows sigmoid inflammation with small volume pneumoperitoneum and abscess measuring 6.5 x 4.5 cm with dilated small bowel loops.  General surgery consulted and recommended medical admission with resuscitation.  Plan for IR to take patient for drainage of abscess tomorrow morning.  IR directly consulted by general surgery. -Zosyn - NPO  #Paroxysmal atrial fibrillation Cardiology consulted.  In ED and recommended starting patient on amiodarone and heparin.  Patient converted before treatment was started and is hemodynamically stable with a normal heart rate.I spoke to [place].  We will hold off on starting amiodarone and he deferred that anticoagulation to general surgery in setting of planned procedures.  Per surgery notes patient will need to be off heparin at midnight for IR procedure, so we will not start heparin at this time.   #Hyponatremia Likely hypovolemic hyponatremia and setting of decreased oral intake.  Reviewing chart it appears patient has had some chronic hyponatremia.  On a hospitalization HCTZ was held and hyponatremia improved. -Urine Na , Urine Osm - Serum Osm -LR at 150 mL/h -Repeat BMP, then q6hrs  - Hold HCTZ  #AKI on CKD stage II Patient's baseline creatinine is 0.95.  Creatinine elevated 1.74 and patient hyponatremic.  Likely prerenal in setting of decreased intake. - LR at 150 mL/h -Trend renal function  #Hypothyroidism TSH within normal limits NPO  Dispo: Admit patient to Inpatient with expected length of stay greater than 2 midnights.  Signed: Madalyn Rob, MD 06/25/2020, 6:33 PM  [id number]:[id number] After 5pm on weekdays and 1pm on weekends: On Call pager: [phone]

## 2020-06-25 NOTE — Consult Note (Signed)
[facility] Surgery Consult Note  Jeanette Mcintosh February 12, 1941  [id number].    Requesting MD: Long  Chief Complaint/Reason for Consult: perforated diverticulitis  HPI:  Patient is a 79 year old female who presented to [facility] with abdominal pain since last Thursday. She reports generalized abdominal pain and some sharper pains in RLQ. She went to see her GI doctor (Dr. Benson Norway) this AM and was found to be hypotensive and in A fib and was transported here by EMS. She reports that she had a prior episode of diverticulitis back in June  and since then has had pain at least once per week. She did not take antibiotics on discharge from the hospital then because there was no perforation. She alternates between constipation and loose stools from taking milk of magnesia. Last colonoscopy was at another facility but she was told she had diverticulosis. She reports that she has had fever to 101 and chills at home. She has had nausea but no vomiting, decreased PO intake and diarrhea. She is very hopeful to avoid having surgery right now if able and really does not want a colostomy. She also would like to be made a DNR.   PMH otherwise significant for HTN, HLD, COPD, Macular degeneration , PAF - not on anticoagulation, CKD stage II and issues with anuria, Hx of cervical cancer s/p hysterectomy, IDDM, Hypothyroidism. Past abdominal surgery includes open appendectomy and hysterectomy and laparoscopic appendectomy. Patient has several medication intolerances and allergies listed in chart and these were reviewed. Patient lives at home with her grandson and her daughter lives nearby, she is normally independent.    ROS: Review of Systems  Constitutional: Positive for chills, fever and malaise/fatigue.  Respiratory: Negative for shortness of breath and wheezing.   Cardiovascular: Negative for chest pain and palpitations.  Gastrointestinal: Positive for abdominal pain, constipation, diarrhea, heartburn and nausea.  Negative for vomiting.  Genitourinary:       Chronic anuria   Psychiatric/Behavioral:       Confusion   All other systems reviewed and are negative.   Family History  Problem Relation Age of Onset  . Basal cell carcinoma Father   . Dementia Father   . Parkinson's disease Father   . Cancer Other        All (5) had Hysterectomies and high dose Estrogen in 1950's were all diagnosed with Breast Cancer.  . Glaucoma Mother   . Allergic rhinitis Mother   . Parkinson's disease Paternal Grandmother   . Eczema Son     Past Medical History:  Diagnosis Date  . Acute kidney injury superimposed on chronic kidney disease ([place]) 11/09/2016  . Arthritis   . Asthma   . Blood transfusion without reported diagnosis   . Cancer (HCC)    Cervical Cancer   . Cataract   . COPD (chronic obstructive pulmonary disease) ([place])   . Eczema   . GERD (gastroesophageal reflux disease)   . Hyperlipidemia   . Hypertension   . IDDM (insulin dependent diabetes mellitus)   . Irritable bowel syndrome   . Macular degeneration   . PAF (paroxysmal atrial fibrillation) ([place]) 2007  . Paroxysmal atrial fibrillation ([place]) 11/09/2016  . Paroxysmal atrial flutter ([place])   . Thyroid disease     Past Surgical History:  Procedure Laterality Date  . ABDOMINAL HYSTERECTOMY  1971   Partial  . APPENDECTOMY  1971  . CATARACT EXTRACTION  2002  . CHOLECYSTECTOMY, LAPAROSCOPIC  2011  . HEMORRHOID SURGERY  1990's   x2  .  renal doppler  12/12/06  . TOTAL HIP ARTHROPLASTY  2012   Left  . TOTAL HIP ARTHROPLASTY  2002   Right  . TRIGGER FINGER RELEASE  1990's   Several surgeries.  . US ECHOCARDIOGRAPHY  01/20/12    Social History:  reports that she quit smoking about 6 years ago. Her smoking use included cigarettes. She started smoking about 69 years ago. She has a 6.25 pack-year smoking history. She has never used smokeless tobacco. She reports current alcohol use. She reports that she does not use drugs.  Allergies:   Allergies  Allergen Reactions  . Morphine And Related Nausea Only  . Phenergan [Promethazine Hcl] Other (See Comments)    Restless leg  . Ambien [Zolpidem] Other (See Comments)    Unknown  . Dicyclomine Other (See Comments)    constipation Other reaction(s): GI Upset (intolerance) constipation  . Diphenhydramine Other (See Comments)    Restless leg Restless leg  . Erythromycin Nausea And Vomiting  . Ivp Dye [Iodinated Diagnostic Agents] Nausea And Vomiting  . Ondansetron Other (See Comments)    heartburn  . Propoxyphene Other (See Comments)    Unknown  . Shellfish Allergy Nausea And Vomiting  . Statins Other (See Comments)    Unknown  . Vitamin E Other (See Comments)    Hot flashes    (Not in a hospital admission)   Blood pressure 101/85, pulse (!) 145, temperature 98 F (36.7 C), temperature source Oral, resp. rate 17, height 5\' 2"  (1.575 m), weight 55.8 kg, SpO2 97 %. Physical Exam:  General: pleasant, WD, WN white female who is laying in bed in NAD HEENT:  Sclera are noninjected.  PERRL.  Ears and nose without any masses or lesions.  Mouth is pink and moist Heart: irregularly irregular rhythm in the 130s-140s. Normal s1,s2. Palpable radial and pedal pulses bilaterally Lungs: CTAB, no wheezes, rhonchi, or rales noted.  Respiratory effort nonlabored Abd: soft, ttp in lower abdomen without peritonitis, ND, BS hypoactive MS: all 4 extremities are symmetrical with no cyanosis, clubbing, or edema. Skin: warm and dry with no masses, lesions, or rashes Neuro: Cranial nerves 2-12 grossly intact, sensation grossly intact throughout Psych: A&Ox3 with an appropriate affect.   Results for orders placed or performed during the hospital encounter of 06/25/20 (from the past 48 hour(s))  Comprehensive metabolic panel     Status: Abnormal   Collection Time: 06/25/20 11:45 AM  Result Value Ref Range   Sodium 125 (L) 135 - 145 mmol/L   Potassium 4.1 3.5 - 5.1 mmol/L   Chloride 88 (L)  98 - 111 mmol/L   CO2 21 (L) 22 - 32 mmol/L   Glucose, Bld 155 (H) 70 - 99 mg/dL    Comment: Glucose reference range applies only to samples taken after fasting for at least 8 hours.   BUN 25 (H) 8 - 23 mg/dL   Creatinine, Ser 1.74 (H) 0.44 - 1.00 mg/dL   Calcium 8.7 (L) 8.9 - 10.3 mg/dL   Total Protein 6.0 (L) 6.5 - 8.1 g/dL   Albumin 2.8 (L) 3.5 - 5.0 g/dL   AST 19 15 - 41 U/L   ALT 16 0 - 44 U/L   Alkaline Phosphatase 80 38 - 126 U/L   Total Bilirubin 0.9 0.3 - 1.2 mg/dL   GFR, Estimated 27 (L) >60 mL/min   Anion gap 16 (H) 5 - 15    Comment: Performed at [facility] [address] [place], [place] [address]  Lipase, blood     Status: None   Collection Time: 06/25/20 11:45 AM  Result Value Ref Range   Lipase 42 11 - 51 U/L    Comment: Performed at [facility], [place] [address] [place], [place] [address]  Troponin I (High Sensitivity)     Status: Abnormal   Collection Time: 06/25/20 11:45 AM  Result Value Ref Range   Troponin I (High Sensitivity) 20 (H) <18 ng/L    Comment: (NOTE) Elevated high sensitivity troponin I (hsTnI) values and significant  changes across serial measurements may suggest ACS but many other  chronic and acute conditions are known to elevate hsTnI results.  Refer to the "Links" section for chest pain algorithms and additional  guidance. Performed at [facility], [place] [address], [place], [place] [address]   Lactic acid, plasma     Status: None   Collection Time: 06/25/20 11:45 AM  Result Value Ref Range   Lactic Acid, Venous 1.7 0.5 - 1.9 mmol/L    Comment: Performed at [facility] [address], [place], [place] [address]  CBC with Differential     Status: Abnormal   Collection Time: 06/25/20 11:45 AM  Result Value Ref Range   WBC 19.8 (H) 4.0 - 10.5 K/uL   RBC 4.19 3.87 - 5.11 MIL/uL   Hemoglobin 13.4 12.0 - 15.0 g/dL   HCT 38.9 36 - 46 %   MCV 92.8 80.0 - 100.0 fL   MCH 32.0 26.0 - 34.0 pg   MCHC 34.4 30.0 - 36.0  g/dL   RDW 12.0 11.5 - 15.5 %   Platelets 444 (H) 150 - 400 K/uL   nRBC 0.0 0.0 - 0.2 %   Neutrophils Relative % 83 %   Neutro Abs 16.5 (H) 1.7 - 7.7 K/uL   Lymphocytes Relative 6 %   Lymphs Abs 1.1 0.7 - 4.0 K/uL   Monocytes Relative 8 %   Monocytes Absolute 1.7 (H) 0.1 - 1.0 K/uL   Eosinophils Relative 1 %   Eosinophils Absolute 0.2 0.0 - 0.5 K/uL   Basophils Relative 1 %   Basophils Absolute 0.1 0.0 - 0.1 K/uL   Immature Granulocytes 1 %   Abs Immature Granulocytes 0.24 (H) 0.00 - 0.07 K/uL    Comment: Performed at [facility] [address] [place], [place] [address]  Respiratory Panel by RT PCR (Flu A&B, Covid) - Nasopharyngeal Swab     Status: None   Collection Time: 06/25/20 11:46 AM   Specimen: Nasopharyngeal Swab  Result Value Ref Range   SARS Coronavirus 2 by RT PCR NEGATIVE NEGATIVE    Comment: (NOTE) SARS-CoV-2 target nucleic acids are NOT DETECTED.  The SARS-CoV-2 RNA is generally detectable in upper respiratoy specimens during the acute phase of infection. The lowest concentration of SARS-CoV-2 viral copies this assay can detect is 131 copies/mL. A negative result does not preclude SARS-Cov-2 infection and should not be used as the sole basis for treatment or other patient management decisions. A negative result may occur with  improper specimen collection/handling, submission of specimen other than nasopharyngeal swab, presence of viral mutation(s) within the areas targeted by this assay, and inadequate number of viral copies (<131 copies/mL). A negative result must be combined with clinical observations, patient history, and epidemiological information. The expected result is Negative.  Fact Sheet for Patients:  PinkCheek.be  Fact Sheet for Healthcare Providers:  GravelBags.it  This test is no t yet approved or cleared  by the [place] and  has been authorized for detection and/or  diagnosis of SARS-CoV-2 by FDA under an Emergency Use Authorization (EUA). This EUA will remain  in effect (meaning this test can be used) for the duration of the COVID-19 declaration under Section 564(b)(1) of the Act, 21 U.S.C. section 360bbb-3(b)(1), unless the authorization is terminated or revoked sooner.     Influenza A by PCR NEGATIVE NEGATIVE   Influenza B by PCR NEGATIVE NEGATIVE    Comment: (NOTE) The Xpert Xpress SARS-CoV-2/FLU/RSV assay is intended as an aid in  the diagnosis of influenza from Nasopharyngeal swab specimens and  should not be used as a sole basis for treatment. Nasal washings and  aspirates are unacceptable for Xpert Xpress SARS-CoV-2/FLU/RSV  testing.  Fact Sheet for Patients: PinkCheek.be  Fact Sheet for Healthcare Providers: GravelBags.it  This test is not yet approved or cleared by the [place] FDA and  has been authorized for detection and/or diagnosis of SARS-CoV-2 by  FDA under an Emergency Use Authorization (EUA). This EUA will remain  in effect (meaning this test can be used) for the duration of the  Covid-19 declaration under Section 564(b)(1) of the Act, 21  U.S.C. section 360bbb-3(b)(1), unless the authorization is  terminated or revoked. Performed at [facility], [place] [address], [place], [place] [address]   CBG monitoring, ED     Status: Abnormal   Collection Time: 06/25/20 11:48 AM  Result Value Ref Range   Glucose-Capillary 137 (H) 70 - 99 mg/dL    Comment: Glucose reference range applies only to samples taken after fasting for at least 8 hours.  Lactic acid, plasma     Status: None   Collection Time: 06/25/20  1:54 PM  Result Value Ref Range   Lactic Acid, Venous 1.5 0.5 - 1.9 mmol/L    Comment: Performed at [facility] [address], [place], [place] [address]  Troponin I (High Sensitivity)     Status: Abnormal   Collection Time: 06/25/20  1:54 PM   Result Value Ref Range   Troponin I (High Sensitivity) 33 (H) <18 ng/L    Comment: (NOTE) Elevated high sensitivity troponin I (hsTnI) values and significant  changes across serial measurements may suggest ACS but many other  chronic and acute conditions are known to elevate hsTnI results.  Refer to the "Links" section for chest pain algorithms and additional  guidance. Performed at [facility], [place] [address], [place],  [address]    CT ABDOMEN PELVIS WO CONTRAST  Result Date: 06/25/2020 CLINICAL DATA:  Clinical concern for bowel obstruction EXAM: CT ABDOMEN AND PELVIS WITHOUT CONTRAST TECHNIQUE: Multidetector CT imaging of the abdomen and pelvis was performed following the standard protocol without IV contrast. COMPARISON:  02/21/2020 FINDINGS: Lower chest: Lung bases are clear.  Small hiatal hernia. Hepatobiliary: The liver has an unremarkable unenhanced appearance. No focal liver lesion is seen. Prior cholecystectomy. Pancreas: Unremarkable. Spleen: Unremarkable. Adrenals/Urinary Tract: Unremarkable adrenal glands. Bilateral kidneys appear within normal limits. No renal stone or hydronephrosis. Urinary bladder is largely obscured by extensive metallic streak artifact within the pelvis from adjacent hip arthroplasties. Stomach/Bowel: Extensive colonic diverticulosis. Thickened appearance of the distal sigmoid colon with surrounding inflammatory changes and an adjacent slightly ill-defined fluid and air containing collection measuring approximately 6.0 x 5.2 x 4.5 cm (series 3, image 57; series 6, image 74). There are multiple mildly dilated and fluid-filled loops of small bowel throughout the abdomen. There is a  loop of small bowel within the left hemiabdomen which demonstrates prominent luminal narrowing suggesting a site of focal stricture (series 3, images 39-41). Underlying mass not excluded. Vascular/Lymphatic: Aortic atherosclerosis. No enlarged abdominal or pelvic lymph nodes.  Reproductive: Status post hysterectomy. No adnexal masses are evident. Other: Small amount of free fluid within the pelvis. Small volume pneumoperitoneum within the non dependent portions of the upper abdomen (for example, series 3, images 11 and 20). Musculoskeletal: Bilateral total hip arthroplasties without apparent complication. Advanced degenerative spondylosis of the visualized thoracolumbar spine. IMPRESSION: 1. Thickened appearance of the distal sigmoid colon with surrounding inflammatory changes and an adjacent fluid and air containing collection measuring approximately 6.0 x 5.2 x 4.5 cm. There is also small volume pneumoperitoneum. Findings compatible with perforated acute sigmoid diverticulitis with adjacent diverticular abscess. Surgical consultation is recommended. 2. Multiple mildly dilated and fluid-filled loops of small bowel throughout the abdomen. Findings favored to represent reactive ileus. 3. There is a loop of small bowel within the left hemiabdomen which demonstrates prominent short segment luminal narrowing suggesting a site of focal stricture. Underlying mass not excluded. Evaluation is limited in the absence of intravenous and oral contrast. Aortic Atherosclerosis (ICD10-I70.0). Critical Value/emergent results were called by telephone at the time of interpretation on 06/25/2020 at 2:12 pm to provider JOSHUA LONG , who verbally acknowledged these results. Electronically Signed   By: Davina Poke D.O.   On: 06/25/2020 14:13   DG Chest Portable 1 View  Result Date: 06/25/2020 CLINICAL DATA:  Chest pain EXAM: PORTABLE CHEST 1 VIEW COMPARISON:  2018 FINDINGS: The heart size and mediastinal contours are within normal limits. Hyperinflation. No new consolidation or edema. No pleural effusion or pneumothorax. The visualized skeletal structures are unremarkable. IMPRESSION: No acute process in the chest. Electronically Signed   By: Macy Mis M.D.   On: 06/25/2020 12:49       Assessment/Plan HTN HLD COPD Macular degeneration  PAF - not on anticoagulation, currently in A Fib with RVR, likely secondary to dehydration 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  Hx of cervical cancer s/p hysterectomy IDDM Hypothyroidism  Acute sigmoid diverticulitis with perforation and abscess - CT shows sigmoid inflammation, small volume pneumoperitoneum and abscess measuring 6 x 5 x 4.5 cm, also dilated small bowel loops - WBC 19.8 and abdomen ttp but not peritonitic  - recommend medical admission and resuscitation  - I have discussed with IR for drainage of abscess - they will likely be able to do this tomorrow  - recommend bowel rest and IV abx - check prealbumin - may require TPN if not able to take PO soon  - hopefully patient will get better with conservative management, if she worsens or becomes unstable she may require surgical exploration which would likely result in colostomy  FEN: ice chips only, IVF VTE: started on heparin gtt - will need to be stopped at midnight tonight for IR procedure tomorrow  ID: [id number] 10/13>>  Norm Parcel, [facility] Surgery 06/25/2020, 3:19 PM Please see Amion for pager number during day hours 7:00am-4:30pm

## 2020-06-25 NOTE — ED Notes (Signed)
Paged admitting to notify of pt now NSR and to clarify if Hep gtt and amio gtt are still wanted.

## 2020-06-26 ENCOUNTER — Inpatient Hospital Stay (HOSPITAL_COMMUNITY): Payer: Medicare HMO

## 2020-06-26 DIAGNOSIS — D72829 Elevated white blood cell count, unspecified: Secondary | ICD-10-CM

## 2020-06-26 DIAGNOSIS — E039 Hypothyroidism, unspecified: Secondary | ICD-10-CM

## 2020-06-26 DIAGNOSIS — N179 Acute kidney failure, unspecified: Secondary | ICD-10-CM

## 2020-06-26 DIAGNOSIS — R339 Retention of urine, unspecified: Secondary | ICD-10-CM

## 2020-06-26 DIAGNOSIS — N182 Chronic kidney disease, stage 2 (mild): Secondary | ICD-10-CM

## 2020-06-26 DIAGNOSIS — I129 Hypertensive chronic kidney disease with stage 1 through stage 4 chronic kidney disease, or unspecified chronic kidney disease: Secondary | ICD-10-CM

## 2020-06-26 DIAGNOSIS — E871 Hypo-osmolality and hyponatremia: Secondary | ICD-10-CM

## 2020-06-26 DIAGNOSIS — I4891 Unspecified atrial fibrillation: Secondary | ICD-10-CM | POA: Diagnosis not present

## 2020-06-26 DIAGNOSIS — R10814 Left lower quadrant abdominal tenderness: Secondary | ICD-10-CM

## 2020-06-26 DIAGNOSIS — I480 Paroxysmal atrial fibrillation: Secondary | ICD-10-CM

## 2020-06-26 DIAGNOSIS — K5720 Diverticulitis of large intestine with perforation and abscess without bleeding: Principal | ICD-10-CM

## 2020-06-26 DIAGNOSIS — R10813 Right lower quadrant abdominal tenderness: Secondary | ICD-10-CM

## 2020-06-26 LAB — COMPREHENSIVE METABOLIC PANEL
ALT: 14 U/L (ref 0–44)
AST: 19 U/L (ref 15–41)
Albumin: 2.4 g/dL — ABNORMAL LOW (ref 3.5–5.0)
Alkaline Phosphatase: 73 U/L (ref 38–126)
Anion gap: 12 (ref 5–15)
BUN: 24 mg/dL — ABNORMAL HIGH (ref 8–23)
CO2: 21 mmol/L — ABNORMAL LOW (ref 22–32)
Calcium: 8.3 mg/dL — ABNORMAL LOW (ref 8.9–10.3)
Chloride: 97 mmol/L — ABNORMAL LOW (ref 98–111)
Creatinine, Ser: 1.61 mg/dL — ABNORMAL HIGH (ref 0.44–1.00)
GFR, Estimated: 30 mL/min — ABNORMAL LOW (ref >60)
Glucose, Bld: 106 mg/dL — ABNORMAL HIGH (ref 70–99)
Potassium: 3.8 mmol/L (ref 3.5–5.1)
Sodium: 130 mmol/L — ABNORMAL LOW (ref 135–145)
Total Bilirubin: 0.7 mg/dL (ref 0.3–1.2)
Total Protein: 5.3 g/dL — ABNORMAL LOW (ref 6.5–8.1)

## 2020-06-26 LAB — CBC
HCT: 32.6 % — ABNORMAL LOW (ref 36.0–46.0)
Hemoglobin: 11 g/dL — ABNORMAL LOW (ref 12.0–15.0)
MCH: 31 pg (ref 26.0–34.0)
MCHC: 33.7 g/dL (ref 30.0–36.0)
MCV: 91.8 fL (ref 80.0–100.0)
Platelets: 369 10*3/uL (ref 150–400)
RBC: 3.55 MIL/uL — ABNORMAL LOW (ref 3.87–5.11)
RDW: 12.1 % (ref 11.5–15.5)
WBC: 14.2 10*3/uL — ABNORMAL HIGH (ref 4.0–10.5)
nRBC: 0 % (ref 0.0–0.2)

## 2020-06-26 LAB — GLUCOSE, CAPILLARY
Glucose-Capillary: 112 mg/dL — ABNORMAL HIGH (ref 70–99)
Glucose-Capillary: 109 mg/dL — ABNORMAL HIGH (ref 70–99)
Glucose-Capillary: 114 mg/dL — ABNORMAL HIGH (ref 70–99)
Glucose-Capillary: 123 mg/dL — ABNORMAL HIGH (ref 70–99)

## 2020-06-26 LAB — BASIC METABOLIC PANEL
Anion gap: 14 (ref 5–15)
BUN: 21 mg/dL (ref 8–23)
CO2: 22 mmol/L (ref 22–32)
Calcium: 8.4 mg/dL — ABNORMAL LOW (ref 8.9–10.3)
Chloride: 95 mmol/L — ABNORMAL LOW (ref 98–111)
Creatinine, Ser: 1.49 mg/dL — ABNORMAL HIGH (ref 0.44–1.00)
GFR, Estimated: 33 mL/min — ABNORMAL LOW (ref >60)
Glucose, Bld: 97 mg/dL (ref 70–99)
Potassium: 3.3 mmol/L — ABNORMAL LOW (ref 3.5–5.1)
Sodium: 131 mmol/L — ABNORMAL LOW (ref 135–145)

## 2020-06-26 LAB — HEMOGLOBIN A1C
Hgb A1c MFr Bld: 6.5 % — ABNORMAL HIGH (ref 4.8–5.6)
Mean Plasma Glucose: 140 mg/dL

## 2020-06-26 LAB — ECHOCARDIOGRAM COMPLETE
Area-P 1/2: 3.37 cm2
Height: 62 in
S' Lateral: 2.12 cm
Weight: 2031.76 oz

## 2020-06-26 LAB — URINE CULTURE: Culture: <10000 — AB

## 2020-06-26 MED ORDER — HYDROMORPHONE HCL 1 MG/ML IJ SOLN
0.5000 mg | Freq: Once | INTRAMUSCULAR | Status: AC | PRN
  Administered 2020-06-26: 0.5 mg via INTRAVENOUS
  Filled 2020-06-26 (×2): qty 1

## 2020-06-26 MED ORDER — CARBOXYMETHYLCELLULOSE SODIUM 0.5 % OP SOLN: 1.0000 [drp] | Freq: Three times a day (TID) | OPHTHALMIC | Status: DC | PRN

## 2020-06-26 MED ORDER — CALCIUM CARBONATE ANTACID 500 MG PO CHEW
1.0000 | CHEWABLE_TABLET | Freq: Every day | ORAL | Status: DC
  Administered 2020-06-26 – 2020-06-27 (×2): 200 mg via ORAL
  Filled 2020-06-26 (×3): qty 1

## 2020-06-26 MED ORDER — MIDAZOLAM HCL 2 MG/2ML IJ SOLN
INTRAMUSCULAR | Status: AC
  Filled 2020-06-26: qty 2

## 2020-06-26 MED ORDER — LEVOTHYROXINE SODIUM 112 MCG PO TABS
112.0000 ug | ORAL_TABLET | Freq: Every day | ORAL | Status: DC
  Administered 2020-06-26 – 2020-06-29 (×4): 112 ug via ORAL
  Filled 2020-06-26 (×4): qty 1

## 2020-06-26 MED ORDER — POTASSIUM CHLORIDE 10 MEQ/100ML IV SOLN
10.0000 meq | INTRAVENOUS | Status: AC
  Administered 2020-06-26 (×5): 10 meq via INTRAVENOUS
  Filled 2020-06-26 (×5): qty 100

## 2020-06-26 MED ORDER — LIDOCAINE HCL 1 % IJ SOLN
INTRAMUSCULAR | Status: AC
  Filled 2020-06-26: qty 20

## 2020-06-26 MED ORDER — LATANOPROST 0.005 % OP SOLN
1.0000 [drp] | Freq: Every day | OPHTHALMIC | Status: DC
  Administered 2020-06-26 – 2020-07-02 (×7): 1 [drp] via OPHTHALMIC
  Filled 2020-06-26: qty 2.5

## 2020-06-26 MED ORDER — HYDROMORPHONE HCL 1 MG/ML IJ SOLN
0.5000 mg | INTRAMUSCULAR | Status: DC | PRN
  Administered 2020-06-26 – 2020-06-27 (×5): 0.5 mg via INTRAVENOUS
  Filled 2020-06-26 (×5): qty 1

## 2020-06-26 MED ORDER — FENTANYL CITRATE (PF) 100 MCG/2ML IJ SOLN
INTRAMUSCULAR | Status: AC
  Filled 2020-06-26: qty 2

## 2020-06-26 MED ORDER — POLYVINYL ALCOHOL 1.4 % OP SOLN
1.0000 [drp] | Freq: Three times a day (TID) | OPHTHALMIC | Status: DC | PRN
  Filled 2020-06-26: qty 15

## 2020-06-26 MED ORDER — METOPROLOL TARTRATE 25 MG PO TABS: 37.5000 mg | ORAL_TABLET | Freq: Two times a day (BID) | ORAL | Status: DC

## 2020-06-26 NOTE — Progress Notes (Signed)
Date: 06/26/2020  Patient name: Jeanette Mcintosh  Medical record number: [id number]  Date of birth: 1941/07/02   I have seen and evaluated Maebelle Munroe and discussed their care with the Residency Team.  In brief, patient is 79 year old female with a past medical history of macular degeneration, CKD stage II, COPD, insulin-dependent type 2 diabetes, hypertension, hyperlipidemia, diverticulitis and paroxysmal A. fib/a flutter who presented to the ED with abdominal pain x1 week.  Patient states that her last episode of diverticulitis was back in June and she has had pain intermittently since then.  Approximately 1 week ago patient noted recurrent abdominal pain that was constant.  Patient followed up with her gastroenterologist Dr. Almyra Free yesterday for this pain and was noted to be in A. fib and was hypotensive and was referred to the ED for further evaluation.  Patient also reports fevers up to 101 F at home associated with chills as well as decreased appetite and nausea.  No syncope, no focal weakness, no headache, no blurry vision, no vomiting, no diarrhea, no chest pain, no shortness of breath.  In the ED, patient was noted to be in A. fib with RVR but converted to normal sinus rhythm.  On CT abdomen/pelvis patient was noted to have small volume pneumoperitoneum as well as an abscess measuring 6.5 x 5.2 x 4.5 cm with likely reactive ileus.  Patient was taken today for possible drainage of abscess with IR but this is unable to be performed as there was no safe percutaneous window.  Patient currently complaining of persistent abdominal distention and pain.  She also wants to to start liquids or ice chips.  Patient also complains of difficulty urinating which has been a chronic issue with her for which she follows up with urology as an outpatient  PMHx, Fam Hx, and/or Soc Hx : As per resident admit note  Vitals:   06/26/20 1005 06/26/20 1111  BP: (!) 112/55 (!) 115/57  Pulse: 71 80  Resp: (!)  8   Temp:  98.3 F (36.8 C)  SpO2: 98% 99%   General: Awake, alert, oriented x3, mild distress secondary to pain CVS: Regular rate and rhythm, normal heart sounds Lungs: CTA bilaterally Abdomen: Soft, distended, mild to moderate right and left lower quadrant tenderness noted to palpation with rebound tenderness in the left lower quadrant, normoactive bowel sounds Extremities: No edema noted, nontender to palpation Psych: Normal mood and affect HEENT: Normocephalic and atraumatic Skin: Warm and dry  Assessment and Plan: I have seen and evaluated the patient as outlined above. I agree with the formulated Assessment and Plan as detailed in the residents' note, with the following changes:   1.  Acute sigmoid diverticulitis with perforation and abscess: -Patient presented to the ED with worsening abdominal pain over the last week and was found to have perforated sigmoid diverticulitis with associated abscess and likely reactive ileus.  Patient also had subjective fevers at home and was noted to have leukocytosis up to 19 on admission. -Leukocytosis has improved to 14 -We will continue with IV Zosyn for now -Patient was scheduled for drainage of abscess with IR today.  However, this cannot be performed as there was no safe percutaneous window -Surgery follow-up and recommendations appreciated.  Will allow sips of clear liquids -We will continue with pain control for now -We will continue to monitor blood work for now. -If patient does not improve she would likely require surgery for this.  She may also require TPN if she does  not recover quickly  2.  Paroxysmal A. Fib: -Patient presented to the ED with A. fib with RVR and associated hypotension. -Patient converted to normal sinus rhythm prior to starting amiodarone -We will hold off anticoagulation in case patient needs to go to surgery urgently. -Patient remains in normal sinus rhythm at this time.  We will continue to monitor her on  telemetry. -Cardiology follow-up and recommendations appreciated. -Patient will need Eliquis on discharge -2D echo results noted -patient with normal EF and normal valvular function  Aldine Contes, MD 10/14/20212:43 PM

## 2020-06-26 NOTE — Progress Notes (Signed)
Bladder scan shows 158 ml in bladder.

## 2020-06-26 NOTE — Progress Notes (Signed)
Echocardiogram 2D Echocardiogram has been performed.  Jeanette Mcintosh 06/26/2020, 11:28 AM

## 2020-06-26 NOTE — Hospital Course (Addendum)
Jeanette Mcintosh is a 79 year old female living with macular degeneration, CKD stage II, COPD not on inhalers, IDDM, HTN, HLD, diverticulitis and paraoxysmal atrial fibrillation/a flutter who presented for evaluation of abdominal pain and found to have a perforated diverticulitis on CT Abdomen.   #Acute sigmoid diverticulitis with perforation and abscess CT abdomen at admission showed pneumoperitoneum and abscess with dilated bowel loops. General surgery was consulted and recommended conservative treatment. Patient started on Zosyn and made NPO. IR was consulted to attempt to drain abscess, but were unsuccessful. TPN was started. Patient continued to decompensate. Repeat CT noted abscess increasing in size and small bowel obstruction. Surgery decided that operation was the only option left. Patient decided she did not want the surgery and felt that she would rather go home on hospice. Patient son who was also HCPOA arrived and noted that patient was in her right mind and at her baseline. Medical team also felt that patient had capacity to make this decision. Palliative care was consulted and patient decided that she wanted comfort care. TPN was discontinued. Patient was provided comfort care measures and discharged to hospice center.   Paroxysmal atrial fibrillation Patient arrived with A fib w/ RVR and was started on dilt gtt; however it was stopped due to hypotension. Patient returned to NSR. Home metoprolol was also being held for hypotension. On second event patient was given amiodarone and low dose of IV metoprolol, but remained tachycardic. Patient was started on heparin gtt; however patient requested to have no anticoagulation as she was okay with the increased risk of hemorraghic bleed and felt that the heparin caused her more discomfort.  Patient continued to have A fib w/ RVR, Cardiology was consulted. Per recc's weaned off of amiodarone and transititoned to dilt gtt. Patient remained on gtt until  discharge.   Hypovolemic hyponatremia Patient was made NPO and provided mIVF. Patient was later started on TPN. Her home medication HCTZ was held during hospitalization. Na improved. Patient was kept on mIVF until the patient determined she wanted comfort care. Patient will be discharged on fluids.  #AKI on CKD stage II Patient received mIVF and Cr returned to baseline.

## 2020-06-26 NOTE — Sedation Documentation (Signed)
Procedure canceled per md

## 2020-06-26 NOTE — Progress Notes (Signed)
[facility] Progress Note     Subjective: Patient reports pain is slightly improved from admission but is still present. She had a loose BM overnight. Denies nausea but still not hungry at all.   Objective: Vital signs in last 24 hours: Temp:  [98 F (36.7 C)-98.6 F (37 C)] 98.6 F (37 C) (10/14 0440) Pulse Rate:  [65-145] 77 (10/14 0440) Resp:  [13-28] 16 (10/14 0440) BP: (86-143)/(52-88) 107/52 (10/14 0440) SpO2:  [75 %-100 %] 95 % (10/14 0440) Weight:  [55.8 kg-57.6 kg] 57.6 kg (10/13 2300) Last BM Date: 06/24/20  Intake/Output from previous day: 10/13 0701 - 10/14 0700 In: 3309.2 [I.V.:1668.1; IV Piggyback:1641.1] Out: 300 [Urine:300] Intake/Output this shift: No intake/output data recorded.  PE: General: pleasant, WD, thin female who is laying in bed in NAD HEENT:  Sclera are noninjected.  PERRL.  Ears and nose without any masses or lesions.  Mouth is pink and moist Heart: irregularly irregular rhythm. Normal s1,s2. Palpable radial and pedal pulses bilaterally Lungs: CTAB, no wheezes, rhonchi, or rales noted.  Respiratory effort nonlabored Abd: soft, ttp in lower abdomen without peritonitis, ND, BS hypoactive MS: all 4 extremities are symmetrical with no cyanosis, clubbing, or edema. Skin: warm and dry with no masses, lesions, or rashes Neuro: Cranial nerves 2-12 grossly intact, sensation grossly intact throughout Psych: A&Ox3 with an appropriate affect.    Lab Results:  Recent Labs    06/25/20 1145  WBC 19.8*  HGB 13.4  HCT 38.9  PLT 444*   BMET Recent Labs    06/25/20 1145 06/26/20 0107  NA 125* 130*  K 4.1 3.8  CL 88* 97*  CO2 21* 21*  GLUCOSE 155* 106*  BUN 25* 24*  CREATININE 1.74* 1.61*  CALCIUM 8.7* 8.3*   PT/INR No results for input(s): LABPROT, INR in the last 72 hours. CMP     Component Value Date/Time   NA 130 (L) 06/26/2020 0107   K 3.8 06/26/2020 0107   CL 97 (L) 06/26/2020 0107   CO2 21 (L) 06/26/2020 0107    GLUCOSE 106 (H) 06/26/2020 0107   BUN 24 (H) 06/26/2020 0107   CREATININE 1.61 (H) 06/26/2020 0107   CALCIUM 8.3 (L) 06/26/2020 0107   PROT 5.3 (L) 06/26/2020 0107   ALBUMIN 2.4 (L) 06/26/2020 0107   AST 19 06/26/2020 0107   ALT 14 06/26/2020 0107   ALKPHOS 73 06/26/2020 0107   BILITOT 0.7 06/26/2020 0107   GFRNONAA 30 (L) 06/26/2020 0107   GFRAA >60 02/23/2020 0520   Lipase     Component Value Date/Time   LIPASE 42 06/25/2020 1145       Studies/Results: CT ABDOMEN PELVIS WO CONTRAST  Result Date: 06/25/2020 CLINICAL DATA:  Clinical concern for bowel obstruction EXAM: CT ABDOMEN AND PELVIS WITHOUT CONTRAST TECHNIQUE: Multidetector CT imaging of the abdomen and pelvis was performed following the standard protocol without IV contrast. COMPARISON:  02/21/2020 FINDINGS: Lower chest: Lung bases are clear.  Small hiatal hernia. Hepatobiliary: The liver has an unremarkable unenhanced appearance. No focal liver lesion is seen. Prior cholecystectomy. Pancreas: Unremarkable. Spleen: Unremarkable. Adrenals/Urinary Tract: Unremarkable adrenal glands. Bilateral kidneys appear within normal limits. No renal stone or hydronephrosis. Urinary bladder is largely obscured by extensive metallic streak artifact within the pelvis from adjacent hip arthroplasties. Stomach/Bowel: Extensive colonic diverticulosis. Thickened appearance of the distal sigmoid colon with surrounding inflammatory changes and an adjacent slightly ill-defined fluid and air containing collection measuring approximately 6.0 x 5.2 x 4.5 cm (series 3, image  57; series 6, image 74). There are multiple mildly dilated and fluid-filled loops of small bowel throughout the abdomen. There is a loop of small bowel within the left hemiabdomen which demonstrates prominent luminal narrowing suggesting a site of focal stricture (series 3, images 39-41). Underlying mass not excluded. Vascular/Lymphatic: Aortic atherosclerosis. No enlarged abdominal or  pelvic lymph nodes. Reproductive: Status post hysterectomy. No adnexal masses are evident. Other: Small amount of free fluid within the pelvis. Small volume pneumoperitoneum within the non dependent portions of the upper abdomen (for example, series 3, images 11 and 20). Musculoskeletal: Bilateral total hip arthroplasties without apparent complication. Advanced degenerative spondylosis of the visualized thoracolumbar spine. IMPRESSION: 1. Thickened appearance of the distal sigmoid colon with surrounding inflammatory changes and an adjacent fluid and air containing collection measuring approximately 6.0 x 5.2 x 4.5 cm. There is also small volume pneumoperitoneum. Findings compatible with perforated acute sigmoid diverticulitis with adjacent diverticular abscess. Surgical consultation is recommended. 2. Multiple mildly dilated and fluid-filled loops of small bowel throughout the abdomen. Findings favored to represent reactive ileus. 3. There is a loop of small bowel within the left hemiabdomen which demonstrates prominent short segment luminal narrowing suggesting a site of focal stricture. Underlying mass not excluded. Evaluation is limited in the absence of intravenous and oral contrast. Aortic Atherosclerosis (ICD10-I70.0). Critical Value/emergent results were called by telephone at the time of interpretation on 06/25/2020 at 2:12 pm to provider JOSHUA LONG , who verbally acknowledged these results. Electronically Signed   By: Davina Poke D.O.   On: 06/25/2020 14:13   DG Chest Portable 1 View  Result Date: 06/25/2020 CLINICAL DATA:  Chest pain EXAM: PORTABLE CHEST 1 VIEW COMPARISON:  2018 FINDINGS: The heart size and mediastinal contours are within normal limits. Hyperinflation. No new consolidation or edema. No pleural effusion or pneumothorax. The visualized skeletal structures are unremarkable. IMPRESSION: No acute process in the chest. Electronically Signed   By: Macy Mis M.D.   On: 06/25/2020  12:49    Anti-infectives: Anti-infectives (From admission, onward)   Start     Dose/Rate Route Frequency Ordered Stop   06/25/20 2200  piperacillin-tazobactam (ZOSYN) IVPB 3.375 g        3.375 g 12.5 mL/hr over 240 Minutes Intravenous Every 8 hours 06/25/20 1439     06/25/20 1445  piperacillin-tazobactam (ZOSYN) IVPB 3.375 g  Status:  Discontinued        3.375 g 100 mL/hr over 30 Minutes Intravenous Every 8 hours 06/25/20 1435 06/25/20 1438   06/25/20 1415  piperacillin-tazobactam (ZOSYN) IVPB 3.375 g        3.375 g 100 mL/hr over 30 Minutes Intravenous  Once 06/25/20 1413 06/25/20 1447       Assessment/Plan HTN HLD COPD Macular degeneration  PAF - rate improved this AM 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  Hx of cervical cancer s/p hysterectomy IDDM Hypothyroidism  Acute sigmoid diverticulitis with perforation and abscess - CT shows sigmoid inflammation, small volume pneumoperitoneum and abscess measuring 6 x 5 x 4.5 cm, also dilated small bowel loops - CBC pending this AM - IR planning to place drain today  - recommend bowel rest and IV abx - prealbumin 11.8, low threshold to start TPN  - may be able to start some sips of clears after procedure if leukocytosis is improving  - hopefully patient will get better with conservative management, if she worsens or becomes unstable she may require surgical exploration which would likely result in colostomy  FEN:  ice chips only, IVF VTE: heparin gtt held ID: Zosyn 10/13>>  LOS: 1 day    Norm Parcel , [facility] 06/26/2020, 8:38 AM Please see Amion for pager number during day hours 7:00am-4:30pm

## 2020-06-26 NOTE — Procedures (Signed)
  Procedure: CT drain (attempted) no safe percutaneous window from anterior or posterior on CT, procedure deferred EBL:   minimal Complications:  none immediate  See full dictation in [employer].  Dillard Cannon MD Main # [phone] Pager  [phone] Mobile [phone]

## 2020-06-26 NOTE — Progress Notes (Signed)
Subjective: Patient reports that she continues to have pain in her abdomen. Patient reports that she went down to IR to have a drained placed, but they were unable to do so. Patient is also concerned that she is having retention. Patient reports that she has an extensive urological history that has progressed. She notes she has had increased difficutly with urination.  Objective:  Vital signs in last 24 hours: Vitals:   06/26/20 0440 06/26/20 1001 06/26/20 1005 06/26/20 1111  BP: (!) 107/52 (!) 112/56 (!) 112/55 (!) 115/57  Pulse: 77 73 71 80  Resp: 16 17 (!) 8   Temp: 98.6 F (37 C)   98.3 F (36.8 C)  TempSrc: Oral   Oral  SpO2: 95% 98% 98% 99%  Weight:      Height:       Physical Exam HENT:     Head: Normocephalic and atraumatic.  Eyes:     Extraocular Movements: Extraocular movements intact.  Cardiovascular:     Rate and Rhythm: Normal rate and regular rhythm.  Pulmonary:     Effort: Pulmonary effort is normal.     Breath sounds: Normal breath sounds.  Abdominal:     General: Bowel sounds are normal.     Palpations: Abdomen is soft. There is no mass.     Tenderness: There is abdominal tenderness. There is rebound.     Comments: No rebound tenderness in RLQ.  Positive to rebound tenderness in LLQ.  Tenderness to palpation in both RLQ and LLQ.  Neurological:     Mental Status: She is alert.     Assessment/Plan:  Principal Problem:   Bowel perforation ([place]) Active Problems:   Essential hypertension   Paroxysmal atrial fibrillation (HCC)   Hyponatremia   Atrial fibrillation with RVR (HCC)   Hypothyroidism   Acute kidney injury superimposed on chronic kidney disease ([place])  Jeanette Mcintosh is a 79 year old female living with macular degeneration, CKD stage II ,COPD not on inhalers, IDDM, HTN, HLD, diverticulitis and paraoxysmal atrial fibrillation/a flutter who presented for evaluation of abdominal pain and found to have a perforated diverticulitis on CT  Abdomen.  #Acute sigmoid diverticulitis with perforation and abscess Patient continues to have abdominal pain. IR was unable to place drain this AM. Will reconsult surgery. Patient continues to require bowel rest. -Zosyn - NPO - Dilaudid 0.5 IV q 4h PRN  #Paroxysmal atrial fibrillation Cardiology consulted.  In ED and recommended starting patient on amiodarone and heparin.  Patient converted before treatment was started and is hemodynamically stable with a normal heart rate.I spoke to [place].  We will hold off on starting amiodarone and he deferred that anticoagulation to general surgery in setting of planned procedures. Patient may need surgical procedure will continue to hold at this time.  #Hyponatremia Likely hypovolemic hyponatremia and setting of decreased oral intake. Na this AM 130. Patient had 26mmol/L increase in 13h. Reviewing chart it appears patient has had some chronic hyponatremia. Urine Osm 303, Urine Na 22. Hyponatremia is likely 2/2 decreased PO intake. -LR at 75 mL/h -Repeat BMP, then q6hrs  - Hold HCTZ  #AKI on CKD stage II Patient's baseline creatinine is 0.95.  Creatinine elevated 1.61 and patient hyponatremic.  Likely prerenal in setting of decreased intake. - LR at 75 mL/h -Trend renal function  #Hypothyroidism TSH within normal limits NPO  Bladder retention Extensive Urological Hx Patient is concerned that she may have retention. Per RN patient has about 300cc in her bladder after reporting voiding.  Patient reports a significant urological history including dilation that caused her to have a bifurcated urinary stream and a failed pessary. Patient also notes that she has a rectocele. Patient has a history with [facility]. Due to patient's complicated urological history concern that foley placement will only cause further problems. - Consult Urology [name], MD 06/26/2020, 1:36 PM Pager: [phone] After 5pm on weekdays and 1pm on  weekends: On Call pager [phone]

## 2020-06-26 NOTE — Progress Notes (Addendum)
Patient returned from CT and ECHO.  Patient stated that she voided while off the floor.  Patient in bed without any complaints of pain or discomfort. Patient concerned about amount of fluid she was taking in (IVF LR @ 75) and not much being voided out.  Patient stated she recently had urethral dilation 2-3 months ago and thinks she is starting to have problems again.   Patient given ice chips by NT and later stated she was having abdominal cramps. I stated she should stop ice chips but she said it had nothing to do with having ice chips. Paged MD to further address. Pt resting with call bell within reach.  Will continue to monitor.

## 2020-06-27 ENCOUNTER — Inpatient Hospital Stay: Payer: Self-pay

## 2020-06-27 DIAGNOSIS — R000 Tachycardia, unspecified: Secondary | ICD-10-CM

## 2020-06-27 LAB — GLUCOSE, CAPILLARY
Glucose-Capillary: 158 mg/dL — ABNORMAL HIGH (ref 70–99)
Glucose-Capillary: 151 mg/dL — ABNORMAL HIGH (ref 70–99)
Glucose-Capillary: 153 mg/dL — ABNORMAL HIGH (ref 70–99)
Glucose-Capillary: 197 mg/dL — ABNORMAL HIGH (ref 70–99)

## 2020-06-27 LAB — CBC
HCT: 31.8 % — ABNORMAL LOW (ref 36.0–46.0)
Hemoglobin: 11.3 g/dL — ABNORMAL LOW (ref 12.0–15.0)
MCH: 32.3 pg (ref 26.0–34.0)
MCHC: 35.5 g/dL (ref 30.0–36.0)
MCV: 90.9 fL (ref 80.0–100.0)
Platelets: 349 10*3/uL (ref 150–400)
RBC: 3.5 MIL/uL — ABNORMAL LOW (ref 3.87–5.11)
RDW: 12.2 % (ref 11.5–15.5)
WBC: 16.3 10*3/uL — ABNORMAL HIGH (ref 4.0–10.5)
nRBC: 0 % (ref 0.0–0.2)

## 2020-06-27 LAB — PHOSPHORUS: Phosphorus: 2.5 mg/dL (ref 2.5–4.6)

## 2020-06-27 LAB — TRIGLYCERIDES: Triglycerides: 136 mg/dL (ref <150)

## 2020-06-27 LAB — BASIC METABOLIC PANEL
Anion gap: 12 (ref 5–15)
BUN: 9 mg/dL (ref 8–23)
CO2: 22 mmol/L (ref 22–32)
Calcium: 8.5 mg/dL — ABNORMAL LOW (ref 8.9–10.3)
Chloride: 96 mmol/L — ABNORMAL LOW (ref 98–111)
Creatinine, Ser: 1.04 mg/dL — ABNORMAL HIGH (ref 0.44–1.00)
GFR, Estimated: 51 mL/min — ABNORMAL LOW (ref >60)
Glucose, Bld: 113 mg/dL — ABNORMAL HIGH (ref 70–99)
Potassium: 4 mmol/L (ref 3.5–5.1)
Sodium: 130 mmol/L — ABNORMAL LOW (ref 135–145)

## 2020-06-27 LAB — MAGNESIUM: Magnesium: 1.3 mg/dL — ABNORMAL LOW (ref 1.7–2.4)

## 2020-06-27 MED ORDER — AMIODARONE LOAD VIA INFUSION
150.0000 mg | Freq: Once | INTRAVENOUS | Status: AC
  Administered 2020-06-27: 150 mg via INTRAVENOUS
  Filled 2020-06-27: qty 83.34

## 2020-06-27 MED ORDER — AMIODARONE HCL IN DEXTROSE 360-4.14 MG/200ML-% IV SOLN
30.0000 mg/h | INTRAVENOUS | Status: DC
  Administered 2020-06-27 – 2020-06-28 (×3): 30 mg/h via INTRAVENOUS
  Filled 2020-06-27 (×2): qty 200

## 2020-06-27 MED ORDER — HEPARIN BOLUS VIA INFUSION
3000.0000 [IU] | Freq: Once | INTRAVENOUS | Status: DC
  Filled 2020-06-27: qty 3000

## 2020-06-27 MED ORDER — MORPHINE SULFATE (PF) 2 MG/ML IV SOLN
1.0000 mg | Freq: Once | INTRAVENOUS | Status: AC
  Administered 2020-06-27: 1 mg via INTRAVENOUS
  Filled 2020-06-27: qty 1

## 2020-06-27 MED ORDER — ONDANSETRON HCL 4 MG/2ML IJ SOLN
4.0000 mg | Freq: Three times a day (TID) | INTRAMUSCULAR | Status: DC | PRN
  Administered 2020-06-27 – 2020-06-30 (×7): 4 mg via INTRAVENOUS
  Filled 2020-06-27 (×7): qty 2

## 2020-06-27 MED ORDER — CHLORHEXIDINE GLUCONATE CLOTH 2 % EX PADS
6.0000 | MEDICATED_PAD | Freq: Every day | CUTANEOUS | Status: DC
  Administered 2020-06-28 – 2020-07-03 (×6): 6 via TOPICAL

## 2020-06-27 MED ORDER — SODIUM CHLORIDE 0.9% FLUSH: 10.0000 mL | INTRAVENOUS | Status: DC | PRN

## 2020-06-27 MED ORDER — CALCIUM CARBONATE ANTACID 500 MG PO CHEW
1.0000 | CHEWABLE_TABLET | ORAL | Status: DC | PRN
  Administered 2020-06-27 – 2020-06-28 (×3): 400 mg via ORAL
  Filled 2020-06-27 (×3): qty 2

## 2020-06-27 MED ORDER — LIDOCAINE 5 % EX PTCH
1.0000 | MEDICATED_PATCH | CUTANEOUS | Status: DC
  Administered 2020-06-27 – 2020-07-02 (×3): 1 via TRANSDERMAL
  Filled 2020-06-27 (×4): qty 1

## 2020-06-27 MED ORDER — HEPARIN (PORCINE) 25000 UT/250ML-% IV SOLN
850.0000 [IU]/h | INTRAVENOUS | Status: DC
  Administered 2020-06-27: 850 [IU]/h via INTRAVENOUS
  Filled 2020-06-27: qty 250

## 2020-06-27 MED ORDER — HEPARIN (PORCINE) 25000 UT/250ML-% IV SOLN: 850.0000 [IU]/h | INTRAVENOUS | Status: DC

## 2020-06-27 MED ORDER — TRAVASOL 10 % IV SOLN
INTRAVENOUS | Status: AC
  Filled 2020-06-27: qty 451.2

## 2020-06-27 MED ORDER — OXYCODONE-ACETAMINOPHEN 5-325 MG PO TABS
1.0000 | ORAL_TABLET | ORAL | Status: DC | PRN
  Filled 2020-06-27: qty 1

## 2020-06-27 MED ORDER — HYDROMORPHONE HCL 1 MG/ML IJ SOLN: 0.5000 mg | Freq: Once | INTRAMUSCULAR | Status: DC | PRN

## 2020-06-27 MED ORDER — MAGNESIUM SULFATE 4 GM/100ML IV SOLN
4.0000 g | Freq: Once | INTRAVENOUS | Status: AC
  Administered 2020-06-27: 4 g via INTRAVENOUS
  Filled 2020-06-27: qty 100

## 2020-06-27 MED ORDER — AMIODARONE HCL IN DEXTROSE 360-4.14 MG/200ML-% IV SOLN
60.0000 mg/h | INTRAVENOUS | Status: DC
  Administered 2020-06-27 (×2): 60 mg/h via INTRAVENOUS
  Filled 2020-06-27 (×2): qty 200

## 2020-06-27 MED ORDER — INSULIN ASPART 100 UNIT/ML ~~LOC~~ SOLN: 0.0000 [IU] | Freq: Four times a day (QID) | SUBCUTANEOUS | Status: DC

## 2020-06-27 MED ORDER — INSULIN ASPART 100 UNIT/ML ~~LOC~~ SOLN
0.0000 [IU] | SUBCUTANEOUS | Status: DC
  Administered 2020-06-27: 2 [IU] via SUBCUTANEOUS
  Administered 2020-06-28: 3 [IU] via SUBCUTANEOUS
  Administered 2020-06-28: 2 [IU] via SUBCUTANEOUS

## 2020-06-27 MED ORDER — METOPROLOL TARTRATE 5 MG/5ML IV SOLN
5.0000 mg | Freq: Once | INTRAVENOUS | Status: AC
  Administered 2020-06-27: 5 mg via INTRAVENOUS
  Filled 2020-06-27: qty 5

## 2020-06-27 MED ORDER — HEPARIN BOLUS VIA INFUSION
3000.0000 [IU] | Freq: Once | INTRAVENOUS | Status: AC
  Administered 2020-06-27: 3000 [IU] via INTRAVENOUS
  Filled 2020-06-27: qty 3000

## 2020-06-27 MED ORDER — HYDROMORPHONE HCL 1 MG/ML IJ SOLN
0.5000 mg | INTRAMUSCULAR | Status: DC | PRN
  Administered 2020-06-27 – 2020-06-28 (×2): 0.5 mg via INTRAVENOUS
  Filled 2020-06-27 (×2): qty 1

## 2020-06-27 MED ORDER — METOPROLOL TARTRATE 50 MG PO TABS
50.0000 mg | ORAL_TABLET | Freq: Two times a day (BID) | ORAL | Status: DC
  Administered 2020-06-27 – 2020-06-29 (×5): 50 mg via ORAL
  Filled 2020-06-27 (×8): qty 1

## 2020-06-27 NOTE — Progress Notes (Signed)
Pt started complaining of more abdominal pain after receiving pain medication this AM, did not want more pain medication. Hot packs were given to provide some relief, she wants to speak with the doctor.

## 2020-06-27 NOTE — Progress Notes (Addendum)
PHARMACY - TOTAL PARENTERAL NUTRITION CONSULT NOTE   Indication: Acute sigmoid diverticulitis w/ colonic perforation and abscess   Patient Measurements: Height: 5\' 2"  (157.5 cm) Weight: 58.6 kg (129 lb 3 oz) IBW/kg (Calculated) : 50.1 TPN AdjBW (KG): 57.6 Body mass index is 23.63 kg/m. Usual Weight: 66kg but started walking more and has been ~57kg since Aug 2021  Assessment:  79 yo W with colonic perforation pending surgery with >1 week decreased/poor PO intake. IR unable to place drain. Per patient, usual weight is 66kg (last documented in March 2021) but she has been walking more with the same PO intake and weight has dropped to ~57kg since at least August. Patient states she does not eat much baseline, does some cooking, sometimes a small [employer]. Has not had anything by mouth except for some apple sauce since 10/7.   Glucose / Insulin: BG <180, A1C 6.5 on insulin glargine at home, no insulin used last 24hr Electrolytes: Na 130 @ Baseline, Mg 1.3> 4g IV x1, K 3.3> 18mEq IV> 4.0; Phos, CoCa 9.7  Renal: Scr 1.04, ClCr ~ 34 ml/min  LFTs / TGs: LFTs wnl, TG 136 Prealbumin / albumin: pending / 2.4 (10/14) Intake / Output; MIVF: UOP 0.2 ml/kg/hr (incomplete documentation) GI Imaging: 10/14 CT pelvis: 4.1cm loculated fluid collection, no window for drainage  10/13 CT abd: 6x 5.2x4.5 cm fluid and air collection in sigmoid colon, likely perforated diverticulitis w/ abscess; likely reactive ileus Surgeries / Procedures:  10/14 failed IR drain placement   Central access: pending (confirmed on PICC team's list for today) TPN start date: 10/15   Nutritional Goals (pending RD recommendation): kCal: 1500-1900, Protein: 75-100, Fluid: ~1862ml/d Goal TPN rate is 75 mL/hr (provides 85 g of protein and 1700 kcals per day)  Current Nutrition:  Clear liquids and TPN  Plan:  Start TPN at 52mL/hr at 1800 (provides 45g AA, 134g Dex, 27g Lipids, 905 Kcal meeting ~ 53% of estimated needs)   F/u RD recommendations  Electrolytes in TPN: 38mEq/L of Na, 36mEq/L of K, 74mEq/L of Ca, 80mEq/L of Mg, and 64mmol/L of Phos. Cl:Ac ratio 2:1 Add standard MVI and trace elements to TPN Initiate Sensitive q4h SSI and adjust as needed  Monitor TPN labs daily until goal then on Mon/Thurs   Benetta Spar, PharmD, BCPS, [facility] Clinical Pharmacist  Please check AMION for all [place] phone numbers After 10:00 PM, call [place]

## 2020-06-27 NOTE — Progress Notes (Signed)
Subjective:  Back in Afib Has abdominal pain  IR drain unsuccessful Potentially could need surgery  Objective:  Vital Signs in the last 24 hours: Temp:  [98.3 F (36.8 C)-99.6 F (37.6 C)] 98.6 F (37 C) (10/15 0833) Pulse Rate:  [71-152] 152 (10/15 0833) Resp:  [8-18] 16 (10/15 0833) BP: (111-163)/(55-97) 148/97 (10/15 0833) SpO2:  [95 %-99 %] 96 % (10/15 0833) Weight:  [58.6 kg] 58.6 kg (10/15 0500)  Intake/Output from previous day: 10/14 0701 - 10/15 0700 In: 900 [I.V.:900] Out: 275 [Urine:275]  Physical Exam Vitals and nursing note reviewed.  Constitutional:      General: She is in acute distress.     Appearance: She is well-developed.  HENT:     Head: Normocephalic and atraumatic.  Eyes:     Conjunctiva/sclera: Conjunctivae normal.     Pupils: Pupils are equal, round, and reactive to light.  Neck:     Vascular: No JVD.  Cardiovascular:     Rate and Rhythm: Tachycardia present. Rhythm irregular.     Pulses: Normal pulses and intact distal pulses.     Heart sounds: No murmur heard.   Pulmonary:     Effort: Pulmonary effort is normal.     Breath sounds: Normal breath sounds. No wheezing or rales.  Abdominal:     General: Bowel sounds are normal.     Palpations: Abdomen is soft.     Tenderness: There is abdominal tenderness. There is no rebound.  Musculoskeletal:        General: No tenderness. Normal range of motion.     Left lower leg: No edema.  Lymphadenopathy:     Cervical: No cervical adenopathy.  Skin:    General: Skin is warm and dry.  Neurological:     Mental Status: She is alert and oriented to person, place, and time.     Cranial Nerves: No cranial nerve deficit.      Lab Results: BMP Recent Labs    02/21/20 1638 02/21/20 1638 02/22/20 0342 02/22/20 0342 02/23/20 0520 06/25/20 1145 06/26/20 0107 06/26/20 0921 06/27/20 0117  NA 127*   < > 131*   < > 133*   < > 130* 131* 130*  K 3.7   < > 3.6   < > 3.6   < > 3.8 3.3* 4.0  CL 88*    < > 96*   < > 99   < > 97* 95* 96*  CO2 24   < > 24   < > 26   < > 21* 22 22  GLUCOSE 161*   < > 124*   < > 96   < > 106* 97 113*  BUN 14   < > 14   < > 9   < > 24* 21 9  CREATININE 0.90   < > 0.97   < > 0.95   < > 1.61* 1.49* 1.04*  CALCIUM 9.3   < > 8.1*   < > 8.5*   < > 8.3* 8.4* 8.5*  GFRNONAA >60   < > 56*   < > 57*   < > 30* 33* 51*  GFRAA >60  --  >60  --  >60  --   --   --   --    < > = values in this interval not displayed.    CBC Recent Labs  Lab 06/25/20 1145 06/26/20 0748 06/27/20 0117  WBC 19.8*   < > 16.3*  RBC 4.19   < >  3.50*  HGB 13.4   < > 11.3*  HCT 38.9   < > 31.8*  PLT 444*   < > 349  MCV 92.8   < > 90.9  MCH 32.0   < > 32.3  MCHC 34.4   < > 35.5  RDW 12.0   < > 12.2  LYMPHSABS 1.1  --   --   MONOABS 1.7*  --   --   EOSABS 0.2  --   --   BASOSABS 0.1  --   --    < > = values in this interval not displayed.    HEMOGLOBIN A1C Lab Results  Component Value Date   HGBA1C 6.5 (H) 06/25/2020   MPG 140 06/25/2020    Cardiac Panel (last 3 results) No results for input(s): CKTOTAL, CKMB, TROPONINI, RELINDX in the last 8760 hours.  BNP (last 3 results) No results for input(s): BNP in the last 8760 hours.  TSH Recent Labs    06/25/20 1608  TSH 0.532    Lipid Panel     Component Value Date/Time   CHOL 212 (H) 05/01/2019 1044   TRIG 120.0 05/01/2019 1044   HDL 54.80 05/01/2019 1044   CHOLHDL 4 05/01/2019 1044   VLDL 24.0 05/01/2019 1044   LDLCALC 134 (H) 05/01/2019 1044     Hepatic Function Panel Recent Labs    02/21/20 1638 06/25/20 1145 06/26/20 0107  PROT 7.7 6.0* 5.3*  ALBUMIN 4.7 2.8* 2.4*  AST 23 19 19   ALT 18 16 14   ALKPHOS 78 80 73  BILITOT 1.1 0.9 0.7     Cardiac Studies:  EKG 06/27/2020: Afib 138 bpm Nonspecific ST-T changes  Echocardiogram 06/26/2020: 1. Left ventricular ejection fraction, by estimation, is 60 to 65%. The  left ventricle has normal function. The left ventricle has no regional  wall motion  abnormalities. Left ventricular diastolic parameters were  normal.  2. Right ventricular systolic function is normal. The right ventricular  size is normal. There is mildly elevated pulmonary artery systolic  pressure. The estimated right ventricular systolic pressure is 16.1 mmHg.  3. The mitral valve is normal in structure. Trivial mitral valve  regurgitation. No evidence of mitral stenosis.  4. The aortic valve is normal in structure. Aortic valve regurgitation is  not visualized. No aortic stenosis is present.  5. The inferior vena cava is normal in size with greater than 50%  respiratory variability, suggesting right atrial pressure of 3 mmHg.   Assessment & Recommendations:  79 y.o. Caucasian female  with hypertension, hyperlipidemia, type 2 DM, paroxysmal atrial flutter, admitted with abdominal pain, found to have perforated sigmoid diverticulitis with abscess, questionable small bowel stricture. Cardiology consulted for management of Afib with RVR.   Paroxysmal Afib w/RVR: Back in Afib w/RVR Likely triggered by acute medical issues.  On amiodarone drip. She got hypotensive with IV diltiazem on admission, probably due to dehydration. Ok to continue amiodarone for now. Resume home dose mtoprolol PO at 50 mg bid. If she will npt be able to take PO at some point, would recommend IV diltiazem and wean off amiodarone. Hold amiodarone. Ok to continue home medication amiodarone. CHA2DS2VASc score 4, annual stroke risk 4.8%. Heparin on hold due to impending surgery. On discharge, consider eliquis 2.5 mg bid.    Nigel Mormon, MD Pager: [phone] Office: [phone]

## 2020-06-27 NOTE — Progress Notes (Signed)
Subjective: Overnight patient went to have BM and was noted to go into A fib. Patient did not correct on her own and rate went into the 180-200s. Patient was reporting mild chest discomfort and palpitations, 5mg  IV metoprolol with mild improvement. Due to lack of rate control patient was started on amiodarone drip. Patient reported that Patient reports that she is still doing unwell. She continues to have abdominal pain and reports that she feels "gassy" but unable to pass gas feeling. She does report a small BM. RN reports that patient has been adequately relieving her bladder on her own.   Objective:  Vital signs in last 24 hours: Vitals:   06/27/20 0500 06/27/20 0538 06/27/20 0617 06/27/20 0833  BP:   111/76 (!) 148/97  Pulse: (!) 148 (!) 132 (!) 129 (!) 152  Resp: 13 18 16 16   Temp:   98.9 F (37.2 C) 98.6 F (37 C)  TempSrc:   Oral Oral  SpO2: 97%  95% 96%  Weight: 58.6 kg     Height:       Physical Exam Constitutional:      Comments: Frail  HENT:     Head: Normocephalic and atraumatic.  Cardiovascular:     Rate and Rhythm: Tachycardia present. Rhythm irregular.     Comments: Patient was in A fib during rounds. Pulmonary:     Effort: Pulmonary effort is normal.     Breath sounds: Normal breath sounds.  Abdominal:     General: There is distension.     Palpations: Abdomen is soft. There is no mass.     Tenderness: There is abdominal tenderness.     Comments: Tender in RLQ and LLQ and LUQ, no rebound today.  Hyperactive bowel sounds  Neurological:     Mental Status: She is alert.     Assessment/Plan:  Principal Problem:   Bowel perforation (HCC) Active Problems:   Essential hypertension   Paroxysmal atrial fibrillation (HCC)   Hyponatremia   Atrial fibrillation with RVR (HCC)   Hypothyroidism   Acute kidney injury superimposed on chronic kidney disease ([place])   Jeanette Mcintosh a 79 year old femaleliving with macular degeneration, CKD stage II,COPD  not on inhalers, IDDM, HTN, HLD,diverticulitis andparaoxysmalatrial fibrillation/a flutter who presented for evaluation of abdominal pain and found to have a perforated diverticulitis on CT Abdomen.  #Acute sigmoid diverticulitis with perforation and abscess Patient continues to have abdominal pain with worsening leukocytosis. Patient reports that she feels the Dilaudid is too much for her and that she does not like Percocet. Surgery would still like to continue with conservative management. Will start TPN for patient today. Patient continues to be able to pass gas and have BM. -Continue Zosyn - Sips of clear diet, start TPN - Stop Dilaudid - Lidocaine patch - Surgery recc's appreciated  #Paroxysmal atrial fibrillation Cardiology consulted. Patient started on amiodarone drip and was started on home metoprolol tartrate. Surgery was okay with starting heparin gtt. - Heparin gtt - Metoprolol tartrate 50mg  BID -Amiodarone gtt -Cardiology recc's appreciated  #Hyponatremia Likely hypovolemic hyponatremia and setting of decreased oral intake. Na this AM 130.Reviewing chart it appears patient has had some chronic hyponatremia. Hyponatremia is likely 2/2 decreased PO intake. Patient had hypomagnesia today, 1.4. -LR at 75 mL/h -RepeatBMP, then q6hrs  - Hold HCTZ -4mg  Mg IV  #AKI on CKD stage II Patient's baseline creatinine is 0.95. Creatinine elevated 1.61>1.04 and patient hyponatremic. Likely prerenal in setting of decreased intake. -LR at 75 mL/h -Trendrenal function  #  Hypothyroidism TSH within normal limits   Bladder retention Extensive Urological Hx Patient has been urinating on her own. Will continue to monitor.   Jeanette Busman, MD 06/27/2020, 1:44 PM Pager: [phone] After 5pm on weekdays and 1pm on weekends: On Call pager [phone]

## 2020-06-27 NOTE — Progress Notes (Signed)
ANTICOAGULATION CONSULT NOTE - Initial Consult  Pharmacy Consult for heparin gtt Indication: atrial fibrillation  Allergies  Allergen Reactions  . Morphine And Related Nausea Only  . Phenergan [Promethazine Hcl] Other (See Comments)    Restless leg  . Ambien [Zolpidem] Other (See Comments)    Unknown  . Dicyclomine Other (See Comments)    constipation Other reaction(s): GI Upset (intolerance) constipation  . Diphenhydramine Other (See Comments)    Restless leg Restless leg  . Erythromycin Nausea And Vomiting  . Ivp Dye [Iodinated Diagnostic Agents] Nausea And Vomiting  . Ondansetron Other (See Comments)    heartburn  . Propoxyphene Other (See Comments)    Unknown  . Shellfish Allergy Nausea And Vomiting  . Statins Other (See Comments)    Unknown  . Vitamin E Other (See Comments)    Hot flashes    Patient Measurements: Height: 5\' 2"  (157.5 cm) Weight: 58.6 kg (129 lb 3 oz) IBW/kg (Calculated) : 50.1 Heparin Dosing Weight: 55.8kg  Vital Signs: Temp: 98.9 F (37.2 C) (10/15 0617) Temp Source: Oral (10/15 0617) BP: 111/76 (10/15 0617) Pulse Rate: 129 (10/15 0617)  Labs: Recent Labs    06/25/20 1145 06/25/20 1145 06/25/20 1354 06/26/20 0107 06/26/20 0748 06/26/20 0921 06/27/20 0117  HGB 13.4   < >  --   --  11.0*  --  11.3*  HCT 38.9  --   --   --  32.6*  --  31.8*  PLT 444*  --   --   --  369  --  349  CREATININE 1.74*   < >  --  1.61*  --  1.49* 1.04*  TROPONINIHS 20*  --  33*  --   --   --   --    < > = values in this interval not displayed.    Estimated Creatinine Clearance: 34.7 mL/min (A) (by C-G formula based on SCr of 1.04 mg/dL (H)).   Medical History: Past Medical History:  Diagnosis Date  . Acute kidney injury superimposed on chronic kidney disease ([place]) 11/09/2016  . Arthritis   . Asthma   . Blood transfusion without reported diagnosis   . Cancer (HCC)    Cervical Cancer   . Cataract   . COPD (chronic obstructive pulmonary disease)  ([place])   . Eczema   . GERD (gastroesophageal reflux disease)   . Hyperlipidemia   . Hypertension   . IDDM (insulin dependent diabetes mellitus)   . Irritable bowel syndrome   . Macular degeneration   . PAF (paroxysmal atrial fibrillation) ([place]) 2007  . Paroxysmal atrial fibrillation ([place]) 11/09/2016  . Paroxysmal atrial flutter ([place])   . Thyroid disease      Assessment: 79 year old female presenting with heart racing and palpitations with known history of afib (on ASA PTA). Pt was found to be in afib. Pharmacy has been consulted to dose heparin. No anticoagulants were reported on med history. Per chart review, patient did not want to be on anticoagulants for afib at recent doctor appts.   Goal of Therapy:  Heparin level 0.3-0.7 units/ml Monitor platelets by anticoagulation protocol: Yes   Plan:  Heparin bolus 3000 units IV Heparin 850 units/hr 8h heparin level Daily heparin level and CBC  Sherlon Handing, PharmD, BCPS Please see amion for complete clinical pharmacist phone list 06/27/2020,6:54 AM

## 2020-06-27 NOTE — Progress Notes (Signed)
Peripherally Inserted Central Catheter Placement  The IV Nurse has discussed with the patient and/or persons authorized to consent for the patient, the purpose of this procedure and the potential benefits and risks involved with this procedure.  The benefits include less needle sticks, lab draws from the catheter, and the patient may be discharged home with the catheter. Risks include, but not limited to, infection, bleeding, blood clot (thrombus formation), and puncture of an artery; nerve damage and irregular heartbeat and possibility to perform a PICC exchange if needed/ordered by physician.  Alternatives to this procedure were also discussed.  Bard Power PICC patient education guide, fact sheet on infection prevention and patient information card has been provided to patient /or left at bedside.    PICC Placement Documentation  PICC Double Lumen 28/36/62 PICC Right Basilic 40 cm 0 cm (Active)  Exposed Catheter (cm) 0 cm 06/27/20 1629  Site Assessment Clean;Dry;Intact 06/27/20 1629  Lumen #1 Status Flushed;Saline locked;Blood return noted 06/27/20 1629  Lumen #2 Status Flushed;Saline locked;Blood return noted 06/27/20 1629  Dressing Type Transparent;Securing device 06/27/20 1629  Dressing Status Clean;Dry;Intact 06/27/20 1629  Antimicrobial disc in place? Yes 06/27/20 1629  Safety Lock Not Applicable 94/76/54 6503  Dressing Change Due 07/04/20 06/27/20 [place] 06/27/2020, 4:34 PM

## 2020-06-27 NOTE — Progress Notes (Signed)
Patient ID: Tejah Brekke, female   DOB: [birth year], 79 y.o.   MRN: [id number]   Acute Care Surgery Service Progress Note:    Chief Complaint/Subjective: Went into Afib overnight No fever. Pain is better than when came in Feels gas pain Reports loose BM Asking if there is an endoscopic option to repair colonic perf  Objective: Vital signs in last 24 hours: Temp:  [98.3 F (36.8 C)-99.6 F (37.6 C)] 98.6 F (37 C) (10/15 0833) Pulse Rate:  [71-152] 152 (10/15 0833) Resp:  [8-18] 16 (10/15 0833) BP: (111-163)/(55-97) 148/97 (10/15 0833) SpO2:  [95 %-99 %] 96 % (10/15 0833) Weight:  [58.6 kg] 58.6 kg (10/15 0500) Last BM Date: 06/25/20  Intake/Output from previous day: 10/14 0701 - 10/15 0700 In: 900 [I.V.:900] Out: 275 [Urine:275] Intake/Output this shift: Total I/O In: -  Out: 200 [Urine:200]  Lungs: cta, nonlabored  Cardiovascular: reg  Abd: soft, doughy, less tender than admission, no guarding  Extremities: no edema, +SCDs  Neuro: alert, nonfocal  Lab Results: CBC  Recent Labs    06/26/20 0748 06/27/20 0117  WBC 14.2* 16.3*  HGB 11.0* 11.3*  HCT 32.6* 31.8*  PLT 369 349   BMET Recent Labs    06/26/20 0921 06/27/20 0117  NA 131* 130*  K 3.3* 4.0  CL 95* 96*  CO2 22 22  GLUCOSE 97 113*  BUN 21 9  CREATININE 1.49* 1.04*  CALCIUM 8.4* 8.5*   LFT Hepatic Function Latest Ref Rng & Units 06/26/2020 06/25/2020 02/21/2020  Total Protein 6.5 - 8.1 g/dL 5.3(L) 6.0(L) 7.7  Albumin 3.5 - 5.0 g/dL 2.4(L) 2.8(L) 4.7  AST 15 - 41 U/L 19 19 23   ALT 0 - 44 U/L 14 16 18   Alk Phosphatase 38 - 126 U/L 73 80 78  Total Bilirubin 0.3 - 1.2 mg/dL 0.7 0.9 1.1   PT/INR No results for input(s): LABPROT, INR in the last 72 hours. ABG No results for input(s): PHART, HCO3 in the last 72 hours.  Invalid input(s): PCO2, PO2  Studies/Results:  Anti-infectives: Anti-infectives (From admission, onward)   Start     Dose/Rate Route Frequency Ordered Stop    06/25/20 2200  piperacillin-tazobactam (ZOSYN) IVPB 3.375 g        3.375 g 12.5 mL/hr over 240 Minutes Intravenous Every 8 hours 06/25/20 1439     06/25/20 1445  piperacillin-tazobactam (ZOSYN) IVPB 3.375 g  Status:  Discontinued        3.375 g 100 mL/hr over 30 Minutes Intravenous Every 8 hours 06/25/20 1435 06/25/20 1438   06/25/20 1415  piperacillin-tazobactam (ZOSYN) IVPB 3.375 g        3.375 g 100 mL/hr over 30 Minutes Intravenous  Once 06/25/20 1413 06/25/20 1447      Medications: Scheduled Meds: . calcium carbonate  1 tablet Oral Daily  . heparin  3,000 Units Intravenous Once  . insulin aspart  0-5 Units Subcutaneous QHS  . insulin aspart  0-9 Units Subcutaneous TID WC  . latanoprost  1 drop Both Eyes QHS  . levothyroxine  112 mcg Oral Daily  . metoprolol tartrate  50 mg Oral BID   Continuous Infusions: . amiodarone 60 mg/hr (06/27/20 0709)   Followed by  . amiodarone    . heparin    . lactated ringers 75 mL/hr at 06/26/20 2226  . magnesium sulfate bolus IVPB 4 g (06/27/20 0822)  . piperacillin-tazobactam (ZOSYN)  IV 3.375 g (06/27/20 0621)   PRN Meds:.HYDROmorphone (DILAUDID) injection, polyvinyl alcohol  Assessment/Plan: Patient Active Problem List   Diagnosis Date Noted  . Bowel perforation ([place]) 06/25/2020  . Diverticulitis 02/22/2020  . Diverticulitis of sigmoid colon 02/21/2020  . Hypothyroidism   . Paroxysmal atrial flutter ([place])   . Macular degeneration   . Hyperlipidemia   . Hypertension associated with diabetes ([place])   . COPD (chronic obstructive pulmonary disease) ([place])   . Atrial fibrillation with RVR ([place])   . Nontraumatic subluxation of extensor tendon at MCP joint of hand, right 04/18/2018  . Sepsis ([place]) 11/09/2016  . Type 2 diabetes mellitus with diabetic nephropathy, with long-term current use of insulin ([place]) 11/09/2016  . Essential hypertension 11/09/2016  . Paroxysmal atrial fibrillation ([place]) 11/09/2016  . Hyponatremia 11/09/2016  .  Elevated troponin 11/09/2016  . Influenza 11/09/2016  . Acute kidney injury superimposed on chronic kidney disease ([place]) 11/09/2016  . IRRITABLE BOWEL SYNDROME 06/17/2008  . CHOLELITHIASIS, HX OF 06/17/2008  . DIVERTICULOSIS-COLON 05/31/2008  . GERD 05/06/2008  . 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  Hx of cervical cancer s/p hysterectomy IDDM Hypothyroidism  Acute sigmoid diverticulitis with perforation and abscess - CT shows sigmoid inflammation, small volume pneumoperitoneum and abscess measuring 6 x 5 x 4.5 cm, also dilated small bowel loops, ?SB stricture - CBC 20-->14-->16 (10/15) - IR not able to drain  - cont  IV abx - prealbumin 11.8, alb 2.4  - I'm ok with sips of clears - given her poor oral intake since last week and labs above and it is unclear which way she is going to declare herself - will start TPN - hopefully patient will get better with conservative management, if she worsens or becomes unstable she may require surgical exploration which would likely result in colostomy -discussed with her that there is not an endoscopic option for colonic perf but she could discuss with Dr Benson [place]  FEN: sips of clear, IVF, start TPN 10/15 VTE: heparin gtt  ID: Zosyn 10/13>> Disposition: picc/tpn, IV abx, mobilize  LOS: 2 days    Leighton Ruff. Redmond Pulling, MD, FACS General, Bariatric, & Minimally Invasive Surgery [phone] [facility]

## 2020-06-27 NOTE — Progress Notes (Signed)
Patient seen for multiple concerns about her medications.  Heparin: Patient has read extensively online and declines heparin, She is primarily concerned about the possibility of bleeding, particularly the risk of ocular bleeding which she read about. 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 She had previously declined outpatient warfarin as well for her PAF. We counseled her extensively on the benefits of anticoagulation, but she is adamant in her preferences. She is alert, oriented, understands the risks and benefits of anticoagulation, and competent to make this decision. I have discontinued her heparin.  Amiodarone: Since starting this for A fib with RVR last night, she has noted increased nausea. She was in A fib with rate as high as 200s last night prior to starting amiodarone. She is also back on her home Lopressor and her BP is tolerating well. She converted out of A fib earlier today on these medications, and we discussed their importance at this time. She is agreeable to trying Zofran for nausea and continuing amiodarone for now.  Nausea: Would like to try Zofran. Wants to avoid anti-cholinergic drugs.  Pain control: She has been on 0.5mg  hydromorphone prior to this, but does not like how sedating it is. We tried morphine, but she reported severe nausea with this and adamantly refuses even a small dose. We also tried oral medications, but she states that she is currently too nausea to tolerate these. Since she is hoping to sleep overnight she is amenable to resuming hydromorphone overnight, I counseled her to only use this prn med as needed. She wants to revisit the discussion on oral medications (states Vicodin has worked for her before) tomorrow morning with the hope that Zofran will control her nausea. Also ordered warm packs which are mildly helpful.  Also requests Tums for heartburn. States she has tried PPIs and H2 blockers in the past and had  issues with them. Wishes to try only Tums for now.  States that her abdominal pain is somewhat worse since pausing her dilaudid. Had a bowel movement today and is passing gas. Is urinating but feels like she is not emptying her bladder entirely.

## 2020-06-27 NOTE — Progress Notes (Addendum)
[place] for heparin gtt Indication: atrial fibrillation  Allergies  Allergen Reactions  . Morphine And Related Nausea Only  . Phenergan [Promethazine Hcl] Other (See Comments)    Restless leg  . Ambien [Zolpidem] Other (See Comments)    Unknown  . Dicyclomine Other (See Comments)    constipation Other reaction(s): GI Upset (intolerance) constipation  . Diphenhydramine Other (See Comments)    Restless leg Restless leg  . Erythromycin Nausea And Vomiting  . Ivp Dye [Iodinated Diagnostic Agents] Nausea And Vomiting  . Ondansetron Other (See Comments)    heartburn  . Propoxyphene Other (See Comments)    Unknown  . Shellfish Allergy Nausea And Vomiting  . Statins Other (See Comments)    Unknown  . Vitamin E Other (See Comments)    Hot flashes    Patient Measurements: Height: 5\' 2"  (157.5 cm) Weight: 58.6 kg (129 lb 3 oz) IBW/kg (Calculated) : 50.1 Heparin Dosing Weight: 55.8kg  Vital Signs: Temp: 98.6 F (37 C) (10/15 0833) Temp Source: Oral (10/15 0833) BP: 148/97 (10/15 0833) Pulse Rate: 152 (10/15 0833)  Labs: Recent Labs    06/25/20 1145 06/25/20 1145 06/25/20 1354 06/26/20 0107 06/26/20 0748 06/26/20 0921 06/27/20 0117  HGB 13.4   < >  --   --  11.0*  --  11.3*  HCT 38.9  --   --   --  32.6*  --  31.8*  PLT 444*  --   --   --  369  --  349  CREATININE 1.74*   < >  --  1.61*  --  1.49* 1.04*  TROPONINIHS 20*  --  33*  --   --   --   --    < > = values in this interval not displayed.    Estimated Creatinine Clearance: 34.7 mL/min (A) (by C-G formula based on SCr of 1.04 mg/dL (H)).   Medical History: Past Medical History:  Diagnosis Date  . Acute kidney injury superimposed on chronic kidney disease ([place]) 11/09/2016  . Arthritis   . Asthma   . Blood transfusion without reported diagnosis   . Cancer (HCC)    Cervical Cancer   . Cataract   . COPD (chronic obstructive pulmonary disease) ([place])   . Eczema   .  GERD (gastroesophageal reflux disease)   . Hyperlipidemia   . Hypertension   . IDDM (insulin dependent diabetes mellitus)   . Irritable bowel syndrome   . Macular degeneration   . PAF (paroxysmal atrial fibrillation) ([place]) 2007  . Paroxysmal atrial fibrillation ([place]) 11/09/2016  . Paroxysmal atrial flutter ([place])   . Thyroid disease      Assessment: 79 year old female presenting with heart racing and palpitations with known history of afib (on ASA PTA). Pt was found to be in afib. Pharmacy has been consulted to dose heparin. No anticoagulants were reported on med history. Per chart review, patient did not want to be on anticoagulants for afib at recent doctor appts.   Heparin was not started this morning due to possible surgery. No further plans for procedures and to start heparin. Plans noted for apixaban when more stable.   Goal of Therapy:  Heparin level 0.3-0.7 units/ml Monitor platelets by anticoagulation protocol: Yes   Plan:  Heparin bolus 3000 units IV Heparin 850 units/hr 8h heparin level Daily heparin level and CBC  Hildred Laser, PharmD Clinical Pharmacist **Pharmacist phone directory can now be found on [email or web address] ([email or web address]).  Listed  under [place].

## 2020-06-27 NOTE — Progress Notes (Signed)
Paged at [phone] for conversion to atrial fibrillation with RVR.  Patient on commode, having mild chest discomfort and later developed tachycardic palpitations, but no chest pain or SOB. She notes long history of PAF going back to 2003. Is having ongoing abdominal pain. Very pleasant and making jokes during assessment.  BP initially in 130-140/80-90 range. Heart rate initially 180-200s. Oxygen saturation WNL.  On telemetry review, converted into A fib with RVR at 0318. No prior A fib on tele since coming to [facility] (about 1.5 days) No acute distress on exam. Breathing comfortably.  Home metoprolol was being held for soft blood pressures on admission which have since improved. 5mg  dose administered IV. Pulse improved to 140-160s range but still remained in A fib. Patient remained chest pain free. EKG was collected without ST changes. Blood pressure decreased to 120s/80s.   Since patient remained in RVR and we were concerned that her BP may not tolerate further doses of metoprolol, decided to start amiodarone drip. Bolus started around 0430. Pulse improved to 120s-140s range. Symptoms unchanged. Continue drip and will follow vitals.

## 2020-06-27 NOTE — Progress Notes (Signed)
Initial Nutrition Assessment  DOCUMENTATION CODES:   Not applicable  INTERVENTION:  TPN per pharmacy.   NUTRITION DIAGNOSIS:   Inadequate oral intake related to  (bowel perforation) as evidenced by estimated needs.  GOAL:   Patient will meet greater than or equal to 90% of their needs  MONITOR:   Skin, Weight trends, Labs, I & O's, Diet advancement  REASON FOR ASSESSMENT:   Consult New TPN/TNA  ASSESSMENT:   79 year old female living with macular degeneration, CKD stage II ,COPD not on inhalers, IDDM, HTN, HLD, diverticulitis and paraoxysmal atrial fibrillation/a flutter who presented for evaluation of abdominal pain and found to have a perforated diverticulitis on CT Abdomen.  Pt reports abdominal pains. Pt currently on clear liquid diet with clearance of only sips per MD. Pt reports she has been unable to take sips of her clears. Pt reports having poor po intake over the past 1 week and only able to take applesauce at home. Prior to the past 1 week and acute illness, pt reports snacking on food throughout the whole day. Weight loss not significant per weight records. Pt additionally reports she has been exercising more recently, which had resulted in weight loss. Plans to start TPN today.  Unable to complete Nutrition-Focused physical exam at this time.   Labs and medications reviewed.   Diet Order:   Diet Order            Diet clear liquid Room service appropriate? Yes; Fluid consistency: Thin  Diet effective now                 EDUCATION NEEDS:   Not appropriate for education at this time  Skin:  Skin Assessment: Reviewed RN Assessment  Last BM:  10/13  Height:   Ht Readings from Last 1 Encounters:  06/25/20 5\' 2"  (1.575 m)    Weight:   Wt Readings from Last 1 Encounters:  06/27/20 58.6 kg   BMI:  Body mass index is 23.63 kg/m.  Estimated Nutritional Needs:   Kcal:  1700-1900  Protein:  85-100 grams  Fluid:  >/= 1.7 L/day  Corrin Parker, MS, RD, LDN RD pager number/after hours weekend pager number on Amion.

## 2020-06-28 DIAGNOSIS — R110 Nausea: Secondary | ICD-10-CM

## 2020-06-28 DIAGNOSIS — J449 Chronic obstructive pulmonary disease, unspecified: Secondary | ICD-10-CM

## 2020-06-28 DIAGNOSIS — E1122 Type 2 diabetes mellitus with diabetic chronic kidney disease: Secondary | ICD-10-CM

## 2020-06-28 DIAGNOSIS — Z794 Long term (current) use of insulin: Secondary | ICD-10-CM

## 2020-06-28 DIAGNOSIS — E861 Hypovolemia: Secondary | ICD-10-CM

## 2020-06-28 LAB — GLUCOSE, CAPILLARY
Glucose-Capillary: 170 mg/dL — ABNORMAL HIGH (ref 70–99)
Glucose-Capillary: 176 mg/dL — ABNORMAL HIGH (ref 70–99)
Glucose-Capillary: 178 mg/dL — ABNORMAL HIGH (ref 70–99)
Glucose-Capillary: 185 mg/dL — ABNORMAL HIGH (ref 70–99)
Glucose-Capillary: 190 mg/dL — ABNORMAL HIGH (ref 70–99)
Glucose-Capillary: 217 mg/dL — ABNORMAL HIGH (ref 70–99)

## 2020-06-28 LAB — COMPREHENSIVE METABOLIC PANEL
ALT: 13 U/L (ref 0–44)
AST: 15 U/L (ref 15–41)
Albumin: 2.3 g/dL — ABNORMAL LOW (ref 3.5–5.0)
Alkaline Phosphatase: 88 U/L (ref 38–126)
Anion gap: 11 (ref 5–15)
BUN: 8 mg/dL (ref 8–23)
CO2: 25 mmol/L (ref 22–32)
Calcium: 8.1 mg/dL — ABNORMAL LOW (ref 8.9–10.3)
Chloride: 95 mmol/L — ABNORMAL LOW (ref 98–111)
Creatinine, Ser: 0.81 mg/dL (ref 0.44–1.00)
GFR, Estimated: >60 mL/min (ref >60)
Glucose, Bld: 165 mg/dL — ABNORMAL HIGH (ref 70–99)
Potassium: 3.6 mmol/L (ref 3.5–5.1)
Sodium: 131 mmol/L — ABNORMAL LOW (ref 135–145)
Total Bilirubin: 0.6 mg/dL (ref 0.3–1.2)
Total Protein: 5 g/dL — ABNORMAL LOW (ref 6.5–8.1)

## 2020-06-28 LAB — CBC
HCT: 30.3 % — ABNORMAL LOW (ref 36.0–46.0)
Hemoglobin: 10.5 g/dL — ABNORMAL LOW (ref 12.0–15.0)
MCH: 31.4 pg (ref 26.0–34.0)
MCHC: 34.7 g/dL (ref 30.0–36.0)
MCV: 90.7 fL (ref 80.0–100.0)
Platelets: 351 10*3/uL (ref 150–400)
RBC: 3.34 MIL/uL — ABNORMAL LOW (ref 3.87–5.11)
RDW: 12.3 % (ref 11.5–15.5)
WBC: 18.1 10*3/uL — ABNORMAL HIGH (ref 4.0–10.5)
nRBC: 0 % (ref 0.0–0.2)

## 2020-06-28 LAB — DIFFERENTIAL
Abs Immature Granulocytes: 0.31 10*3/uL — ABNORMAL HIGH (ref 0.00–0.07)
Basophils Absolute: 0.1 10*3/uL (ref 0.0–0.1)
Basophils Relative: 0 %
Eosinophils Absolute: 0.5 10*3/uL (ref 0.0–0.5)
Eosinophils Relative: 3 %
Immature Granulocytes: 2 %
Lymphocytes Relative: 5 %
Lymphs Abs: 1 10*3/uL (ref 0.7–4.0)
Monocytes Absolute: 1.4 10*3/uL — ABNORMAL HIGH (ref 0.1–1.0)
Monocytes Relative: 8 %
Neutro Abs: 14.9 10*3/uL — ABNORMAL HIGH (ref 1.7–7.7)
Neutrophils Relative %: 82 %

## 2020-06-28 LAB — PHOSPHORUS: Phosphorus: 2.4 mg/dL — ABNORMAL LOW (ref 2.5–4.6)

## 2020-06-28 LAB — MAGNESIUM: Magnesium: 1.8 mg/dL (ref 1.7–2.4)

## 2020-06-28 LAB — PREALBUMIN: Prealbumin: 7.6 mg/dL — ABNORMAL LOW (ref 18–38)

## 2020-06-28 MED ORDER — TRAVASOL 10 % IV SOLN
INTRAVENOUS | Status: AC
  Filled 2020-06-28: qty 633.6

## 2020-06-28 MED ORDER — POTASSIUM PHOSPHATES 15 MMOLE/5ML IV SOLN
10.0000 mmol | Freq: Once | INTRAVENOUS | Status: AC
  Administered 2020-06-28: 10 mmol via INTRAVENOUS
  Filled 2020-06-28: qty 3.33

## 2020-06-28 MED ORDER — ACETAMINOPHEN 10 MG/ML IV SOLN
1000.0000 mg | Freq: Four times a day (QID) | INTRAVENOUS | Status: AC
  Administered 2020-06-28 – 2020-06-29 (×4): 1000 mg via INTRAVENOUS
  Filled 2020-06-28 (×4): qty 100

## 2020-06-28 MED ORDER — OXYCODONE HCL 5 MG PO TABS: 5.0000 mg | ORAL_TABLET | Freq: Four times a day (QID) | ORAL | Status: DC | PRN

## 2020-06-28 MED ORDER — INSULIN ASPART 100 UNIT/ML ~~LOC~~ SOLN
0.0000 [IU] | SUBCUTANEOUS | Status: DC
  Administered 2020-06-28 (×4): 3 [IU] via SUBCUTANEOUS
  Administered 2020-06-29 (×2): 5 [IU] via SUBCUTANEOUS
  Administered 2020-06-29: 2 [IU] via SUBCUTANEOUS
  Administered 2020-06-29 (×3): 3 [IU] via SUBCUTANEOUS
  Administered 2020-06-30: 5 [IU] via SUBCUTANEOUS
  Administered 2020-06-30 (×4): 3 [IU] via SUBCUTANEOUS
  Administered 2020-07-01 (×2): 2 [IU] via SUBCUTANEOUS
  Administered 2020-07-01: 5 [IU] via SUBCUTANEOUS
  Administered 2020-07-01: 3 [IU] via SUBCUTANEOUS

## 2020-06-28 MED ORDER — HYDROMORPHONE HCL 1 MG/ML IJ SOLN
0.5000 mg | Freq: Four times a day (QID) | INTRAMUSCULAR | Status: DC | PRN
  Administered 2020-06-28 – 2020-06-30 (×4): 0.5 mg via INTRAVENOUS
  Filled 2020-06-28 (×5): qty 1

## 2020-06-28 MED ORDER — LACTATED RINGERS IV SOLN: INTRAVENOUS | Status: DC

## 2020-06-28 NOTE — Progress Notes (Signed)
Subjective:  Back in sinus rhythm Has abdominal pain and distention Has a lot of nausea, attributes it to amiodarone Awaiting surgical decision  Objective:  Vital Signs in the last 24 hours: Temp:  [98.2 F (36.8 C)-98.8 F (37.1 C)] 98.5 F (36.9 C) (10/16 0700) Pulse Rate:  [70-82] 82 (10/16 0910) Resp:  [17-20] 18 (10/16 0700) BP: (120-152)/(72-81) 145/79 (10/16 0910) SpO2:  [95 %-98 %] 95 % (10/16 0700)  Intake/Output from previous day: 10/15 0701 - 10/16 0700 In: 480 [I.V.:480] Out: 570 [Urine:570]  Physical Exam Vitals and nursing note reviewed.  Constitutional:      General: She is in acute distress.     Appearance: She is well-developed.  HENT:     Head: Normocephalic and atraumatic.  Eyes:     Conjunctiva/sclera: Conjunctivae normal.     Pupils: Pupils are equal, round, and reactive to light.  Neck:     Vascular: No JVD.  Cardiovascular:     Rate and Rhythm: Normal rate and regular rhythm.     Pulses: Normal pulses and intact distal pulses.     Heart sounds: No murmur heard.   Pulmonary:     Effort: Pulmonary effort is normal.     Breath sounds: Normal breath sounds. No wheezing or rales.  Abdominal:     General: Bowel sounds are normal. There is distension.     Palpations: Abdomen is soft.     Tenderness: There is abdominal tenderness. There is no rebound.  Musculoskeletal:        General: No tenderness. Normal range of motion.     Left lower leg: No edema.  Lymphadenopathy:     Cervical: No cervical adenopathy.  Skin:    General: Skin is warm and dry.  Neurological:     Mental Status: She is alert and oriented to person, place, and time.     Cranial Nerves: No cranial nerve deficit.      Lab Results: BMP Recent Labs    02/21/20 1638 02/21/20 1638 02/22/20 0342 02/22/20 0342 02/23/20 0520 06/25/20 1145 06/26/20 0921 06/27/20 0117 06/28/20 0558  NA 127*   < > 131*   < > 133*   < > 131* 130* 131*  K 3.7   < > 3.6   < > 3.6   < > 3.3*  4.0 3.6  CL 88*   < > 96*   < > 99   < > 95* 96* 95*  CO2 24   < > 24   < > 26   < > 22 22 25   GLUCOSE 161*   < > 124*   < > 96   < > 97 113* 165*  BUN 14   < > 14   < > 9   < > 21 9 8   CREATININE 0.90   < > 0.97   < > 0.95   < > 1.49* 1.04* 0.81  CALCIUM 9.3   < > 8.1*   < > 8.5*   < > 8.4* 8.5* 8.1*  GFRNONAA >60   < > 56*   < > 57*   < > 33* 51* >60  GFRAA >60  --  >60  --  >60  --   --   --   --    < > = values in this interval not displayed.    CBC Recent Labs  Lab 06/28/20 0558  WBC 18.1*  RBC 3.34*  HGB 10.5*  HCT 30.3*  PLT 351  MCV 90.7  MCH 31.4  MCHC 34.7  RDW 12.3  LYMPHSABS 1.0  MONOABS 1.4*  EOSABS 0.5  BASOSABS 0.1    HEMOGLOBIN A1C Lab Results  Component Value Date   HGBA1C 6.5 (H) 06/25/2020   MPG 140 06/25/2020    Cardiac Panel (last 3 results) No results for input(s): CKTOTAL, CKMB, TROPONINI, RELINDX in the last 8760 hours.  BNP (last 3 results) No results for input(s): BNP in the last 8760 hours.  TSH Recent Labs    06/25/20 1608  TSH 0.532    Lipid Panel     Component Value Date/Time   CHOL 212 (H) 05/01/2019 1044   TRIG 136 06/27/2020 0117   HDL 54.80 05/01/2019 1044   CHOLHDL 4 05/01/2019 1044   VLDL 24.0 05/01/2019 1044   LDLCALC 134 (H) 05/01/2019 1044     Hepatic Function Panel Recent Labs    06/25/20 1145 06/26/20 0107 06/28/20 0558  PROT 6.0* 5.3* 5.0*  ALBUMIN 2.8* 2.4* 2.3*  AST 19 19 15   ALT 16 14 13   ALKPHOS 80 73 88  BILITOT 0.9 0.7 0.6     Cardiac Studies:  EKG 06/27/2020: Sinus rhythm 66 bpm  EKG 06/27/2020: Afib 138 bpm Nonspecific ST-T changes  Echocardiogram 06/26/2020: 1. Left ventricular ejection fraction, by estimation, is 60 to 65%. The  left ventricle has normal function. The left ventricle has no regional  wall motion abnormalities. Left ventricular diastolic parameters were  normal.  2. Right ventricular systolic function is normal. The right ventricular  size is normal. There  is mildly elevated pulmonary artery systolic  pressure. The estimated right ventricular systolic pressure is 77.8 mmHg.  3. The mitral valve is normal in structure. Trivial mitral valve  regurgitation. No evidence of mitral stenosis.  4. The aortic valve is normal in structure. Aortic valve regurgitation is  not visualized. No aortic stenosis is present.  5. The inferior vena cava is normal in size with greater than 50%  respiratory variability, suggesting right atrial pressure of 3 mmHg.   Assessment & Recommendations:  79 y.o. Caucasian female  with hypertension, hyperlipidemia, type 2 DM, paroxysmal atrial flutter, admitted with abdominal pain, found to have perforated sigmoid diverticulitis with abscess, questionable small bowel stricture. Cardiology consulted for management of Afib with RVR.   Paroxysmal Afib w/RVR: Back in sinus rhythm. Heparin stopped at patient's request. She does not want to be on anticoagulation. Stop amiodarone drip. Continue PO metoprolol 50 mg bid. If she goes back in Afib, recommend diltiazem drip.   Nigel Mormon, MD Pager: [phone] Office: [phone]

## 2020-06-28 NOTE — Progress Notes (Addendum)
Subjective:  Overnight team was called to patients room to stop Heparin, see note for details   Patient is laying in bed on exam this morning.  She ask for amiodarone to be stopped because it is positive her feel nauseous.  She says her abdominal pain continues to feel worse.  She has spoken to surgery PA this morning and tells [place] the PA will be talking to Dr. Redmond Pulling.  She is NPO and hopes we can still avoid surgery.   Objective:  Vital signs in last 24 hours: Vitals:   06/27/20 2006 06/27/20 2100 06/27/20 2312 06/28/20 0339  BP: (!) 145/81  (!) 152/79 120/72  Pulse:   72 70  Resp: 18 20 18 17   Temp: 98.2 F (36.8 C)  98.8 F (37.1 C) 98.5 F (36.9 C)  TempSrc: Oral  Oral Oral  SpO2: 98%  98% 96%  Weight:      Height:        CBC Latest Ref Rng & Units 06/27/2020 06/26/2020 06/25/2020  WBC 4.0 - 10.5 K/uL 16.3(H) 14.2(H) 19.8(H)  Hemoglobin 12.0 - 15.0 g/dL 11.3(L) 11.0(L) 13.4  Hematocrit 36 - 46 % 31.8(L) 32.6(L) 38.9  Platelets 150 - 400 K/uL 349 369 444(H)   CMP Latest Ref Rng & Units 06/27/2020 06/26/2020 06/26/2020  Glucose 70 - 99 mg/dL 113(H) 97 106(H)  BUN 8 - 23 mg/dL 9 21 24(H)  Creatinine 0.44 - 1.00 mg/dL 1.04(H) 1.49(H) 1.61(H)  Sodium 135 - 145 mmol/L 130(L) 131(L) 130(L)  Potassium 3.5 - 5.1 mmol/L 4.0 3.3(L) 3.8  Chloride 98 - 111 mmol/L 96(L) 95(L) 97(L)  CO2 22 - 32 mmol/L 22 22 21(L)  Calcium 8.9 - 10.3 mg/dL 8.5(L) 8.4(L) 8.3(L)  Total Protein 6.5 - 8.1 g/dL - - 5.3(L)  Total Bilirubin 0.3 - 1.2 mg/dL - - 0.7  Alkaline Phos 38 - 126 U/L - - 73  AST 15 - 41 U/L - - 19  ALT 0 - 44 U/L - - 14    General: Elderly female laying in bed, no acute distress Cardiovascular: Normal rate, regular rhythm.  No murmurs, rubs, or gallops Pulmonary : Effort normal, breath sounds normal. No wheezes, rales, or rhonchi Abdominal: Soft, distended, guarding left lower quadrant, hypoactive bowel sounds Psychiatric/Behavioral:  normal mood, normal behavior     Assessment/Plan:  Principal Problem:   Bowel perforation ([place]) Active Problems:   Essential hypertension   Paroxysmal atrial fibrillation (H[facility])   Hyponatremia   Atrial fibrillation with RVR (H[facility])   Hypothyroidism   Acute kidney injury superimposed on chronic kidney disease ([place])   Jeanette Mcintosh is a 79 year old female living with macular degeneration, CKD stage II, COPD not on inhalers, IDDM, HTN, HLD, diverticulitis and paraoxysmal atrial fibrillation/a flutter who presented for evaluation of abdominal pain and found to have a perforated diverticulitis on CT Abdomen.   #Acute sigmoid diverticulitis with perforation and abscess Patient continues to have abdominal pain and a worsening leukocytosis.  Given patient is 79 years old likely would require colostomy and revision, surgery is recommending trial of conservative management.  Patient on Zosyn .  - greatly appreciate general surgery consulting and continued monitoring for possible need for surgery.  -Continue Zosyn -NPO with sips with meds , start TPN -Every 6 hours Dilaudid 0.5 mg, as needed for severe pain - Lidocaine patch as needed   #Paroxysmal atrial fibrillation Review of telemetry, patient converted back in sinus rhythm yesterday afternoon.  She asked for heparin to be stopped.  She also asked for amiodarone to be stopped.  Greatly appreciate cardiology consult, she follows with [facility].  Recommendations to start diltiazem drip if patient goes back into atrial fibrillation. - Heparin gtt stopped at request of patient - Amiodarone gtt stopped -Continue po metoprolol tartrate 50mg  BID   #Hypovolemic hyponatremia Patient has a history of some chronic hyponatremia which resolved with holding HCTZ.   -LR at 75 mL/h -Trend with morning labs - Hold HCTZ  #Hypomagnesia #Hypophosphatemia -Patient on TPN   #AKI on CKD stage II ( AKI resolved) Patient's baseline creatinine is 0.95.  Creatinine  elevated 1.61 on admission and has trended down with IV fluids.  -Continue LR at 75 mL/h -Trend renal function   #Hypothyroidism TSH within normal limits, RN reporting patient is not always keeping down her medications. -Continue Synthroid.    #Bladder retention Patient reports extensive history of difficulty urinating and urethral dilation procedure in the past.  Patient has been urinating on her own. Will continue to monitor.  If found to be retaining recommend in out cath.  If unable to in and out cath after one attempt , will consult urology for assistance given history of needing to follow with urology.   Madalyn Rob, MD  06/28/2020, 6:00 AM Pager: [phone] After 5pm on weekdays and 1pm on weekends: On Call pager [phone]

## 2020-06-28 NOTE — Progress Notes (Signed)
Subjective: CC: Patient reports that her pain is slightly worse today in her left abdomen, primarily in the LLQ. She notes pain was a 6-7/10 yesterday and is a 8/10 this morning. She reports that she is nauseated and has had a few episodes of emesis yesterday. She has only drank a few sips of gingerale and had a mango ice pop that she threw up yesterday. She feels her nausea is worse with the dilaudid. Feels distended. We discussed using IV tylenol and zofran for nausea. She is passing flatus. Has had streaks of stool.   Objective: Vital signs in last 24 hours: Temp:  [98.2 F (36.8 C)-98.8 F (37.1 C)] 98.5 F (36.9 C) (10/16 0700) Pulse Rate:  [70-79] 79 (10/16 0700) Resp:  [17-20] 18 (10/16 0700) BP: (120-152)/(72-81) 152/76 (10/16 0700) SpO2:  [95 %-98 %] 95 % (10/16 0700) Last BM Date: 06/27/20  Intake/Output from previous day: 10/15 0701 - 10/16 0700 In: 480 [I.V.:480] Out: 570 [Urine:570] Intake/Output this shift: No intake/output data recorded.  PE: Gen:  Alert, NAD, pleasant Card: Reg rate Pulm: Normal rate and efofrt.  Abd: Distended but soft. LLQ>LUQ tenderness without rigidity or guarding. +BS Psych: A&Ox3  Skin: no rashes noted, warm and dry   Lab Results:  Recent Labs    06/27/20 0117 06/28/20 0558  WBC 16.3* 18.1*  HGB 11.3* 10.5*  HCT 31.8* 30.3*  PLT 349 351   BMET Recent Labs    06/27/20 0117 06/28/20 0558  NA 130* 131*  K 4.0 3.6  CL 96* 95*  CO2 22 25  GLUCOSE 113* 165*  BUN 9 8  CREATININE 1.04* 0.81  CALCIUM 8.5* 8.1*   PT/INR No results for input(s): LABPROT, INR in the last 72 hours. CMP     Component Value Date/Time   NA 131 (L) 06/28/2020 0558   K 3.6 06/28/2020 0558   CL 95 (L) 06/28/2020 0558   CO2 25 06/28/2020 0558   GLUCOSE 165 (H) 06/28/2020 0558   BUN 8 06/28/2020 0558   CREATININE 0.81 06/28/2020 0558   CALCIUM 8.1 (L) 06/28/2020 0558   PROT 5.0 (L) 06/28/2020 0558   ALBUMIN 2.3 (L) 06/28/2020 0558    AST 15 06/28/2020 0558   ALT 13 06/28/2020 0558   ALKPHOS 88 06/28/2020 0558   BILITOT 0.6 06/28/2020 0558   GFRNONAA >60 06/28/2020 0558   GFRAA >60 02/23/2020 0520   Lipase     Component Value Date/Time   LIPASE 42 06/25/2020 1145       Studies/Results: CT PELVIS LIMITED WO CONTRAST  Result Date: 06/26/2020 CLINICAL DATA:  Diverticular abscess.  Drainage requested EXAM: CT PELVIS WITHOUT CONTRAST TECHNIQUE: Multidetector CT imaging of the pelvis was performed following the standard protocol without intravenous contrast. COMPARISON:  the previous day's study FINDINGS: Urinary Tract: Bladder physiologically distended, imaging largely degraded by streak artifact. No hydronephrosis. Bowel: Multiple gas and fluid distended small bowel loops throughout the abdomen, slightly increased since previous. The colon is nondilated. Innumerable distal descending and sigmoid diverticula. 4.1 cm fluid collection in the mesentery in the right pelvis, slightly decreased in size since previous, with adjacent inflammatory/edematous changes. No percutaneous window for drainage from anterior or transgluteal approach. Vascular/Lymphatic: Aortoiliac calcified atheromatous plaque without aneurysm. No adenopathy localized. Reproductive:  Post hysterectomy.  No adnexal masses identified. Other: No ascites. Minimal extraluminal gas in the left upper quadrant as before (Im9-14,Se2) . Musculoskeletal: Bilateral hip arthroplasty hardware resulting in streak significant streak artifact degrading portions  of the scan through the pelvis. Lower lumbar spondylitic changes. No worrisome bone lesion. IMPRESSION: 1. Slight decrease in size of 4.1 cm loculated pelvic fluid collection, with adjacent inflammatory/edematous changes. NO percutaneous window for drainage from anterior or transgluteal approach. Planned percutaneous drainage was therefore deferred. 2. Slight increase in gas and fluid distended small bowel loops throughout the  abdomen, suggesting developing ileus or partial obstruction. 3. Descending and sigmoid diverticulosis. Aortic Atherosclerosis (ICD10-I70.0). Electronically Signed   By: Lucrezia Europe M.D.   On: 06/26/2020 14:54   ECHOCARDIOGRAM COMPLETE  Result Date: 06/26/2020    ECHOCARDIOGRAM REPORT   Patient Name:   Jeanette Mcintosh Date of Exam: 06/26/2020 Medical Rec #:  [id number]     Height:       62.0 in Accession #:    [id number]    Weight:       127.0 lb Date of Birth:  Jul 01, 1941     BSA:          1.576 m Patient Age:    40 years      BP:           112/55 mmHg Patient Gender: F             HR:           77 bpm. Exam Location:  Inpatient Procedure: 2D Echo, Color Doppler and Cardiac Doppler Indications:    I48.91* Unspecified atrial fibrillation  History:        Patient has prior history of Echocardiogram examinations, most                 recent 11/10/2016. COPD, Arrythmias:Atrial Fibrillation; Risk                 Factors:Hypertension, Diabetes and Dyslipidemia.  Sonographer:    Raquel Sarna Senior RDCS Referring Phys: [id number] [facility]AN  Sonographer Comments: Technically difficult due to small rib spacing. IMPRESSIONS  1. Left ventricular ejection fraction, by estimation, is 60 to 65%. The left ventricle has normal function. The left ventricle has no regional wall motion abnormalities. Left ventricular diastolic parameters were normal.  2. Right ventricular systolic function is normal. The right ventricular size is normal. There is mildly elevated pulmonary artery systolic pressure. The estimated right ventricular systolic pressure is 38.2 mmHg.  3. The mitral valve is normal in structure. Trivial mitral valve regurgitation. No evidence of mitral stenosis.  4. The aortic valve is normal in structure. Aortic valve regurgitation is not visualized. No aortic stenosis is present.  5. The inferior vena cava is normal in size with greater than 50% respiratory variability, suggesting right atrial pressure of 3 mmHg. FINDINGS   Left Ventricle: Left ventricular ejection fraction, by estimation, is 60 to 65%. The left ventricle has normal function. The left ventricle has no regional wall motion abnormalities. The left ventricular internal cavity size was normal in size. There is  no left ventricular hypertrophy. Left ventricular diastolic parameters were normal. Right Ventricle: The right ventricular size is normal. No increase in right ventricular wall thickness. Right ventricular systolic function is normal. There is mildly elevated pulmonary artery systolic pressure. The tricuspid regurgitant velocity is 2.84  m/s, and with an assumed right atrial pressure of 3 mmHg, the estimated right ventricular systolic pressure is 50.5 mmHg. Left Atrium: Left atrial size was normal in size. Right Atrium: Right atrial size was normal in size. Pericardium: There is no evidence of pericardial effusion. Mitral Valve: The mitral valve is normal in structure. Trivial  mitral valve regurgitation. No evidence of mitral valve stenosis. Tricuspid Valve: The tricuspid valve is normal in structure. Tricuspid valve regurgitation is mild . No evidence of tricuspid stenosis. Aortic Valve: The aortic valve is normal in structure. Aortic valve regurgitation is not visualized. No aortic stenosis is present. Pulmonic Valve: The pulmonic valve was normal in structure. Pulmonic valve regurgitation is not visualized. No evidence of pulmonic stenosis. Aorta: The aortic root is normal in size and structure. Venous: The inferior vena cava is normal in size with greater than 50% respiratory variability, suggesting right atrial pressure of 3 mmHg. IAS/Shunts: No atrial level shunt detected by color flow Doppler.  LEFT VENTRICLE PLAX 2D LVIDd:         3.71 cm  Diastology LVIDs:         2.12 cm  LV e' medial:    11.40 cm/s LV PW:         0.83 cm  LV E/e' medial:  7.8 LV IVS:        0.78 cm  LV e' lateral:   10.20 cm/s LVOT diam:     2.10 cm  LV E/e' lateral: 8.7 LV SV:         87  LV SV Index:   55 LVOT Area:     3.46 cm  RIGHT VENTRICLE RV S prime:     11.70 cm/s TAPSE (M-mode): 2.3 cm LEFT ATRIUM             Index       RIGHT ATRIUM           Index LA diam:        3.40 cm 2.16 cm/m  RA Area:     11.20 cm LA Vol (A2C):   48.8 ml 30.96 ml/m RA Volume:   22.50 ml  14.28 ml/m LA Vol (A4C):   41.9 ml 26.59 ml/m LA Biplane Vol: 47.4 ml 30.07 ml/m  AORTIC VALVE LVOT Vmax:   96.80 cm/s LVOT Vmean:  64.200 cm/s LVOT VTI:    0.251 m  AORTA Ao Root diam: 3.10 cm MITRAL VALVE               TRICUSPID VALVE MV Area (PHT): 3.37 cm    TR Peak grad:   32.3 mmHg MV Decel Time: 225 msec    TR Vmax:        284.00 cm/s MV E velocity: 89.20 cm/s MV A velocity: 87.80 cm/s  SHUNTS MV E/A ratio:  1.02        Systemic VTI:  0.25 m                            Systemic Diam: 2.10 cm Ena Dawley MD Electronically signed by Ena Dawley MD Signature Date/Time: 06/26/2020/2:04:59 PM    Final    [place] EKG SITE RITE  Result Date: 06/27/2020 If Site Rite image not attached, placement could not be confirmed due to current cardiac rhythm.   Anti-infectives: Anti-infectives (From admission, onward)   Start     Dose/Rate Route Frequency Ordered Stop   06/25/20 2200  piperacillin-tazobactam (ZOSYN) IVPB 3.375 g        3.375 g 12.5 mL/hr over 240 Minutes Intravenous Every 8 hours 06/25/20 1439     06/25/20 1445  piperacillin-tazobactam (ZOSYN) IVPB 3.375 g  Status:  Discontinued        3.375 g 100 mL/hr over 30 Minutes Intravenous Every 8 hours 06/25/20 1435  06/25/20 1438   06/25/20 1415  piperacillin-tazobactam (ZOSYN) IVPB 3.375 g        3.375 g 100 mL/hr over 30 Minutes Intravenous  Once 06/25/20 1413 06/25/20 1447       Assessment/Plan Patient Active Problem List   Diagnosis Date Noted  . Bowel perforation ([place]) 06/25/2020  . Diverticulitis 02/22/2020  . Diverticulitis of sigmoid colon 02/21/2020  . Hypothyroidism   . Paroxysmal atrial flutter ([place])   . Macular degeneration   .  Hyperlipidemia   . Hypertension associated with diabetes ([place])   . COPD (chronic obstructive pulmonary disease) ([place])   . Atrial fibrillation with RVR ([place])   . Nontraumatic subluxation of extensor tendon at MCP joint of hand, right 04/18/2018  . Sepsis ([place]) 11/09/2016  . Type 2 diabetes mellitus with diabetic nephropathy, with long-term current use of insulin ([place]) 11/09/2016  . Essential hypertension 11/09/2016  . Paroxysmal atrial fibrillation ([place]) 11/09/2016  . Hyponatremia 11/09/2016  . Elevated troponin 11/09/2016  . Influenza 11/09/2016  . Acute kidney injury superimposed on chronic kidney disease ([place]) 11/09/2016  . IRRITABLE BOWEL SYNDROME 06/17/2008  . CHOLELITHIASIS, HX OF 06/17/2008  . DIVERTICULOSIS-COLON 05/31/2008  . GERD 05/06/2008  . DIARRHEA 05/06/2008  HTN HLD COPD Macular degeneration  PAF -rate improved this AM, On amio drip and BID metoprolol. Appreciate cards assistance CKDstage II with chronic issues with oliguria  Hx of cervical cancer s/p hysterectomy IDDM - SSI Hypothyroidism - Per medicine -   Acute sigmoid diverticulitis with perforation and abscess - CT 10/13 shows sigmoid inflammation, small volume pneumoperitoneum and abscess measuring 6 x 5 x 4.5 cm, also dilated small bowel loops, ?SB stricture -CBC 20-->14-->16 --> 18 (10/16) -IR not able to drain - cont IV abx -prealbumin 11.8 --> 7.6 - I'm ok with sips of clears - Cont TPN - D/c CLD. NPO. She reports she gets nauseated after Dilaudid. I have added IV Tylenol. Will allows sips with meds for oral oxy prn.  - Hopefully patient will get better with conservative management. Her WBC is elevated and her pain is slightly worse today. She is distended with n/v. I have made her NPO. She is without peritonitis. Trend CBC. If patient does not improve in the next 24-48 hours, will consider repeat CT vs discussion about surgery. If patient becomes unstable she may require surgical exploration.  Surgery which would likely result in colostomy.   FEN: NPO, IVF, start TPN 10/15 VTE: previously on heparin gtt(patient wished for it to be stopped. Please see primaries note) ID: Zosyn 10/13>>   LOS: 3 days    Jillyn Ledger , [facility] 06/28/2020, 8:56 AM Please see Amion for pager number during day hours 7:00am-4:30pm

## 2020-06-28 NOTE — Progress Notes (Signed)
PHARMACY - TOTAL PARENTERAL NUTRITION CONSULT NOTE   Indication: Acute sigmoid diverticulitis w/ colonic perforation and abscess   Patient Measurements: Height: 5\' 2"  (157.5 cm) Weight: 58.6 kg (129 lb 3 oz) IBW/kg (Calculated) : 50.1 TPN AdjBW (KG): 57.6 Body mass index is 23.63 kg/m. Usual Weight: 66kg but started walking more and has been ~57kg since Aug 2021  Assessment:  79 yo W with colonic perforation pending surgery with >1 week decreased/poor PO intake. IR unable to place drain. Per patient, usual weight is 66kg (last documented in March 2021) but she has been walking more with the same PO intake and weight has dropped to ~57kg since at least August. Patient states she does not eat much baseline, does some cooking, sometimes a small [employer]. Has not had anything by mouth except for some apple sauce since 10/7.   Glucose / Insulin: A1C 6.5 on insulin glargine PTA. CBGs controlled off TPN, now up to 165-217 since TPN start. Utilized 7u sSSI in last 24hrs Electrolytes: Na 131 stable, Cl low 95, Mg up to 1.8 (s/p Mag sulfate 4g IV x1 yesterday), Phos 2.5>2.4, others WNL Renal: AKI resolving - SCr down to 0.81, BUN WNL LFTs / TGs: LFTs / Tbili / TG WNL Prealbumin / albumin: Prealbumin low 7.6; albumin 2.3 Intake / Output; MIVF: UOP 0.4 ml/kg/hr (incomplete documentation); LBM 10/15; MIVF: LR at 75 ml/hr GI Imaging: 10/14 CT pelvis: 4.1cm loculated fluid collection, no window for drainage  10/13 CT abd: 6x 5.2x4.5 cm fluid and air collection in sigmoid colon, likely perforated diverticulitis w/ abscess; likely reactive ileus Surgeries / Procedures: 10/14 failed IR drain placement   Central access: PICC 10/15 TPN start date: 10/15   Nutritional Goals (per RD recommendation 10/15): kCal: 1700-1900, Protein: 85-100g, Fluid: >/=1.7 L/day Goal TPN rate is 75 mL/hr (provides 86 g of protein and 1704 kcal per day)  Current Nutrition:  Clear liquids and TPN  Plan:  Increase  TPN slightly to 49mL/hr at 1800. Titrate further to goal 10/17 if CBGs and electrolytes controlled. This TPN will provide 63g protein, 185g CHO, and 37g SMOF lipids, for total 1252 kCal - meeting 74% of patient protein and kCal needs. Electrolytes in TPN: Continue same today (lytes will increase with rate increase) - 47mEq/L of Na, 65mEq/L of K, 64mEq/L of Ca, 27mEq/L of Mg, and 63mmol/L of Phos. Adjust Cl:Ac ratio to 2:1 Give KPhos 2mmol IV x 1 Add standard MVI and trace elements to TPN Reduce LR to 60 ml/hr at 1800 when new TPN bag hung Change to Moderate q4h SSI and adjust as needed  Monitor TPN labs, f/u Surgery plans   Arturo Morton, PharmD, BCPS Please check AMION for all [place] contact numbers Clinical Pharmacist 06/28/2020 8:11 AM

## 2020-06-29 ENCOUNTER — Inpatient Hospital Stay (HOSPITAL_COMMUNITY): Payer: Medicare HMO

## 2020-06-29 LAB — BASIC METABOLIC PANEL
Anion gap: 6 (ref 5–15)
BUN: 9 mg/dL (ref 8–23)
CO2: 30 mmol/L (ref 22–32)
Calcium: 8 mg/dL — ABNORMAL LOW (ref 8.9–10.3)
Chloride: 95 mmol/L — ABNORMAL LOW (ref 98–111)
Creatinine, Ser: 0.76 mg/dL (ref 0.44–1.00)
GFR, Estimated: >60 mL/min (ref >60)
Glucose, Bld: 195 mg/dL — ABNORMAL HIGH (ref 70–99)
Potassium: 3.6 mmol/L (ref 3.5–5.1)
Sodium: 131 mmol/L — ABNORMAL LOW (ref 135–145)

## 2020-06-29 LAB — CBC
HCT: 30.8 % — ABNORMAL LOW (ref 36.0–46.0)
Hemoglobin: 10.9 g/dL — ABNORMAL LOW (ref 12.0–15.0)
MCH: 32.2 pg (ref 26.0–34.0)
MCHC: 35.4 g/dL (ref 30.0–36.0)
MCV: 91.1 fL (ref 80.0–100.0)
Platelets: 394 10*3/uL (ref 150–400)
RBC: 3.38 MIL/uL — ABNORMAL LOW (ref 3.87–5.11)
RDW: 12.4 % (ref 11.5–15.5)
WBC: 18.3 10*3/uL — ABNORMAL HIGH (ref 4.0–10.5)
nRBC: 0 % (ref 0.0–0.2)

## 2020-06-29 LAB — GLUCOSE, CAPILLARY
Glucose-Capillary: 137 mg/dL — ABNORMAL HIGH (ref 70–99)
Glucose-Capillary: 159 mg/dL — ABNORMAL HIGH (ref 70–99)
Glucose-Capillary: 171 mg/dL — ABNORMAL HIGH (ref 70–99)
Glucose-Capillary: 174 mg/dL — ABNORMAL HIGH (ref 70–99)
Glucose-Capillary: 178 mg/dL — ABNORMAL HIGH (ref 70–99)
Glucose-Capillary: 214 mg/dL — ABNORMAL HIGH (ref 70–99)
Glucose-Capillary: 231 mg/dL — ABNORMAL HIGH (ref 70–99)

## 2020-06-29 LAB — MAGNESIUM: Magnesium: 1.6 mg/dL — ABNORMAL LOW (ref 1.7–2.4)

## 2020-06-29 LAB — PHOSPHORUS: Phosphorus: 3.3 mg/dL (ref 2.5–4.6)

## 2020-06-29 MED ORDER — MAGNESIUM SULFATE 2 GM/50ML IV SOLN
2.0000 g | Freq: Once | INTRAVENOUS | Status: AC
  Administered 2020-06-29: 2 g via INTRAVENOUS
  Filled 2020-06-29: qty 50

## 2020-06-29 MED ORDER — TRAVASOL 10 % IV SOLN
INTRAVENOUS | Status: AC
  Filled 2020-06-29: qty 748.8

## 2020-06-29 MED ORDER — ACETAMINOPHEN 10 MG/ML IV SOLN
1000.0000 mg | Freq: Four times a day (QID) | INTRAVENOUS | Status: AC
  Administered 2020-06-29 – 2020-06-30 (×4): 1000 mg via INTRAVENOUS
  Filled 2020-06-29 (×4): qty 100

## 2020-06-29 MED ORDER — LACTATED RINGERS IV SOLN: INTRAVENOUS | Status: DC

## 2020-06-29 MED ORDER — DILTIAZEM HCL-DEXTROSE 125-5 MG/125ML-% IV SOLN (PREMIX)
5.0000 mg/h | INTRAVENOUS | Status: DC
  Administered 2020-06-29: 5 mg/h via INTRAVENOUS
  Administered 2020-06-29 – 2020-07-03 (×9): 10 mg/h via INTRAVENOUS
  Filled 2020-06-29 (×9): qty 125

## 2020-06-29 NOTE — Progress Notes (Signed)
Pt went to [place] with rate 120-150's. 12 lead EKG done, placed in the chart. On call MD [place] called and notified. Pt stated " I dont want heparin anymore if they want to start it back, I would rather stroke out than bleed. "  Pt resting in bed, non symptomatic. Stated he feels better than before. MD stated he will come and see the patient. Please see MAR for new orders. Will continue to monitor.

## 2020-06-29 NOTE — Progress Notes (Signed)
Paroxysmal afib with RVR continues. Now on diltiazem drip with rate in 60s. Continue the same. Not on anticoagulation as per orients wishes.   Vernell Leep, MD

## 2020-06-29 NOTE — Progress Notes (Signed)
Noticed small area of pus filled blisters on right buttocks. Pt stated its not painful or itchy. Pt stated it might be genital herpes as she always had it on that site. assessed with charge nurse.  Will continue to monitor.

## 2020-06-29 NOTE — Progress Notes (Signed)
Subjective: CC: Patient reports that her pain is unchanged from yesterday, continued pain in her lower abdomen that is worse in the left lower quadrant.  She has had continued abdominal distention as well as nausea and several episodes of emesis with brown fluid.  She notes she also had 3 episodes of liquidy stools in the last 24 hours.    Objective: Vital signs in last 24 hours: Temp:  [97.6 F (36.4 C)-99.3 F (37.4 C)] 98.2 F (36.8 C) (10/17 0800) Pulse Rate:  [61-135] 61 (10/17 0800) Resp:  [13-19] 13 (10/17 0800) BP: (103-166)/(68-105) 166/94 (10/17 0800) SpO2:  [94 %-98 %] 95 % (10/17 0800) Last BM Date: 06/29/20  Intake/Output from previous day: 10/16 0701 - 10/17 0700 In: 1396.3 [P.O.:240; I.V.:661.6; IV Piggyback:494.7] Out: -   Patient voiding at baseline.  Only 1 occurrence noted on chart however.  Strict I/O Intake/Output this shift: No intake/output data recorded.  PE: Gen:  Alert, NAD, pleasant Card: Reg rate Pulm: Normal rate and efofrt.  Abd: Distended but soft. Lower abdominal tenderness greatest in the LLQ. No rigidity or guarding. Hypoactive bowel sounds.  Psych: A&Ox3  Skin: no rashes noted, warm and dry  Lab Results:  Recent Labs    06/28/20 0558 06/29/20 0500  WBC 18.1* 18.3*  HGB 10.5* 10.9*  HCT 30.3* 30.8*  PLT 351 394   BMET Recent Labs    06/28/20 0558 06/29/20 0500  NA 131* 131*  K 3.6 3.6  CL 95* 95*  CO2 25 30  GLUCOSE 165* 195*  BUN 8 9  CREATININE 0.81 0.76  CALCIUM 8.1* 8.0*   PT/INR No results for input(s): LABPROT, INR in the last 72 hours. CMP     Component Value Date/Time   NA 131 (L) 06/29/2020 0500   K 3.6 06/29/2020 0500   CL 95 (L) 06/29/2020 0500   CO2 30 06/29/2020 0500   GLUCOSE 195 (H) 06/29/2020 0500   BUN 9 06/29/2020 0500   CREATININE 0.76 06/29/2020 0500   CALCIUM 8.0 (L) 06/29/2020 0500   PROT 5.0 (L) 06/28/2020 0558   ALBUMIN 2.3 (L) 06/28/2020 0558   AST 15 06/28/2020 0558   ALT 13  06/28/2020 0558   ALKPHOS 88 06/28/2020 0558   BILITOT 0.6 06/28/2020 0558   GFRNONAA >60 06/29/2020 0500   GFRAA >60 02/23/2020 0520   Lipase     Component Value Date/Time   LIPASE 42 06/25/2020 1145       Studies/Results: No results found.  Anti-infectives: Anti-infectives (From admission, onward)   Start     Dose/Rate Route Frequency Ordered Stop   06/25/20 2200  piperacillin-tazobactam (ZOSYN) IVPB 3.375 g        3.375 g 12.5 mL/hr over 240 Minutes Intravenous Every 8 hours 06/25/20 1439     06/25/20 1445  piperacillin-tazobactam (ZOSYN) IVPB 3.375 g  Status:  Discontinued        3.375 g 100 mL/hr over 30 Minutes Intravenous Every 8 hours 06/25/20 1435 06/25/20 1438   06/25/20 1415  piperacillin-tazobactam (ZOSYN) IVPB 3.375 g        3.375 g 100 mL/hr over 30 Minutes Intravenous  Once 06/25/20 1413 06/25/20 1447       Assessment/Plan Patient Active Problem List   Diagnosis Date Noted  . Bowel perforation ([place]) 06/25/2020  . Diverticulitis 02/22/2020  . Diverticulitis of sigmoid colon 02/21/2020  . Hypothyroidism   . Paroxysmal atrial flutter ([place])   . Macular degeneration   .  Hyperlipidemia   . Hypertension associated with diabetes ([place])   . COPD (chronic obstructive pulmonary disease) ([place])   . Atrial fibrillation with RVR ([place])   . Nontraumatic subluxation of extensor tendon at MCP joint of hand, right 04/18/2018  . Sepsis ([place]) 11/09/2016  . Type 2 diabetes mellitus with diabetic nephropathy, with long-term current use of insulin ([place]) 11/09/2016  . Essential hypertension 11/09/2016  . Paroxysmal atrial fibrillation ([place]) 11/09/2016  . Hyponatremia 11/09/2016  . Elevated troponin 11/09/2016  . Influenza 11/09/2016  . Acute kidney injury superimposed on chronic kidney disease ([place]) 11/09/2016  . IRRITABLE BOWEL SYNDROME 06/17/2008  . CHOLELITHIASIS, HX OF 06/17/2008  . DIVERTICULOSIS-COLON 05/31/2008  . GERD 05/06/2008  . DIARRHEA 05/06/2008   HTN HLD COPD Macular degeneration  PAF -rate improved this AM, A. Fib overnight. Now on Dilt and Lopressor. 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 If patient becomes unstable she may require surgical exploration. Surgery which would likely result in colostomy. Her WBC is stable today at 18.3 and her pain is unchanged.  - She is distended with n/v. Will get abd xray to confirm she does not need an NGT. CT 10/13 notes possible SB stricture vs ileus 2/2 diverticulitis.   FEN:NPO, IVF, TPN  VTE: previously on heparin gtt(patient wished for it to be stopped. Please see primaries note). Okay w/ chemical prophylaxis from a general surgery standpoint.  ID: Zosyn 10/13>>   LOS: 4 days    Jillyn Ledger , [facility] Surgery 06/29/2020, 9:14 AM Please see Amion for pager number during day hours 7:00am-4:30pm

## 2020-06-29 NOTE — Progress Notes (Signed)
Abdominal x-ray confirmed proper placement of NG tube in stomach.  NG tube set to low intermittent suction.  Pt will continue to be monitored.

## 2020-06-29 NOTE — Progress Notes (Signed)
PHARMACY - TOTAL PARENTERAL NUTRITION CONSULT NOTE   Indication: Acute sigmoid diverticulitis w/ colonic perforation and abscess   Patient Measurements: Height: 5\' 2"  (157.5 cm) Weight: 58.6 kg (129 lb 3 oz) IBW/kg (Calculated) : 50.1 TPN AdjBW (KG): 57.6 Body mass index is 23.63 kg/m. Usual Weight: 66kg but started walking more and has been ~57kg since Aug 2021  Assessment:  79 yo W with colonic perforation pending surgery with >1 week decreased/poor PO intake. IR unable to place drain. Per patient, usual weight is 66kg (last documented in March 2021) but she has been walking more with the same PO intake and weight has dropped to ~57kg since at least August. Patient states she does not eat much baseline, does some cooking, sometimes a small [employer]. Has not had anything by mouth except for some apple sauce since 10/7.   Glucose / Insulin: A1C 6.5 on insulin glargine PTA. CBGs controlled off TPN, now 170-214. Utilized 20 units mSSI in last 24hrs Electrolytes: Na 131 stable, Cl low 95, Mg 1.6, Phos up to 3.3 (s/p Kphos 75mmol x 1 yesterday), others WNL Renal: AKI resolving - SCr down to 0.81, BUN WNL LFTs / TGs: LFTs / Tbili / TG WNL Prealbumin / albumin: Prealbumin low 7.6; albumin 2.3 Intake / Output; MIVF: UOP 0.4 ml/kg/hr (incomplete documentation); LBM 10/17; MIVF: LR at 75 ml/hr GI Imaging: 10/14 CT pelvis: 4.1cm loculated fluid collection, no window for drainage  10/13 CT abd: 6x 5.2x4.5 cm fluid and air collection in sigmoid colon, likely perforated diverticulitis w/ abscess; likely reactive ileus Surgeries / Procedures: 10/14 failed IR drain placement   Central access: PICC 10/15 TPN start date: 10/15   Nutritional Goals (per RD recommendation 10/15): kCal: 1700-1900, Protein: 85-100g, Fluid: >/=1.7 L/day Goal TPN rate is 75 mL/hr (provides 86 g of protein and 1704 kcal per day)  Current Nutrition:  TPN; NPO except for meds/ice chips CLD d/c'd 10/16 with  N/V  Plan:  Increase TPN slightly to 7mL/hr at 1800. Titrate to goal 10/17 if CBGs controlled. This TPN will provide 75g protein, 218g CHO, and 44g SMOF lipids, for total 1478 kCal - meeting 88% of protein and 87% of kCal needs. Electrolytes in TPN: Continue same today (lytes will increase with rate increase) - 36mEq/L of Na, 4mEq/L of K, 77mEq/L of Ca, 66mEq/L of Mg, and 66mmol/L of Phos. Adjust Cl:Ac ratio to max Cl IMTS already ordered Mag sulfate 2g IV x 1 Add standard MVI and trace elements to TPN Continue LR at 75 ml/hr per MD Add 10 units regular insulin to TPN bag + continue Moderate q4h SSI and adjust as needed  Monitor TPN labs, f/u Surgery plans   Arturo Morton, PharmD, BCPS Please check AMION for all [place] contact numbers Clinical Pharmacist 06/29/2020 8:08 AM

## 2020-06-29 NOTE — Progress Notes (Signed)
Called and notified DR. Wilson that pt's emesis color has now changed to brown. MD said no new orders for now. Will continue to monitor.

## 2020-06-29 NOTE — Progress Notes (Signed)
Gastric tube placed per MD order.  51 cm left in exterior  length of tube.  Pt tolerated fairly well. Abdominal X-ray ordered.

## 2020-06-29 NOTE — Progress Notes (Signed)
Subjective: Overnight patient was noted to go into A fib/RVR with rate 120-150s. Night team saw patient and patient reported that she continues to not want heparin gtt and would rather "stroke out than bleed." Patient was started back on her diltiazem drip.  Patient reports that she is feeling worse and has has increased nausea.  Objective:  Vital signs in last 24 hours: Vitals:   06/29/20 0408 06/29/20 0430 06/29/20 0500 06/29/20 0530  BP: 132/89 129/88 103/68 (!) 120/93  Pulse: (!) 116 (!) 108 85 90  Resp: 19 16 17 14   Temp:  97.6 F (36.4 C)    TempSrc:      SpO2: 96% 97% 94% 96%  Weight:      Height:       Physical Exam Constitutional:      General: She is not in acute distress. HENT:     Head: Normocephalic and atraumatic.  Eyes:     Extraocular Movements: Extraocular movements intact.  Cardiovascular:     Rate and Rhythm: Normal rate and regular rhythm.  Abdominal:     General: There is distension.     Comments: Actively dry heaving and having gastric emesis on exam.  Hypoactive bowel sounds  Skin:    General: Skin is warm and dry.     Comments: Small crusting nodules clustered at the sacrum appear herpetic  Neurological:     General: No focal deficit present.     Mental Status: She is alert.     Assessment/Plan:  Principal Problem:   Bowel perforation (HCC) Active Problems:   Essential hypertension   Paroxysmal atrial fibrillation (HCC)   Hyponatremia   Atrial fibrillation with RVR (HCC)   Hypothyroidism   Acute kidney injury superimposed on chronic kidney disease ([place]) Jeanette Mcintosh a 79 year old femaleliving with macular degeneration, CKD stage II, COPD not on inhalers, IDDM, HTN, HLD,diverticulitis andparaoxysmalatrial fibrillation/a flutter who presented for evaluation of abdominal pain and found to have a perforated diverticulitis on CT Abdomen.  #Acute sigmoid diverticulitis with perforation and abscess Patient continues to have  abdominal pain and a worsening leukocytosis.  Given patient is 79 years old likely would require colostomy and revision, surgery is recommending trial of conservative management.  Patient on Zosyn .   - greatly appreciate general surgery consulting and continued monitoring for possible need for surgery. Consider NG tube pending Surgery recc's. -Continue Zosyn  -NPO with sips with meds , start TPN -Every 6 hours Dilaudid 0.5 mg, as needed for severe pain - Lidocaine patch as needed - F/u KUB   #Paroxysmal atrial fibrillation Patient went back into A fib/RVR overnight. She continues to not want heparin gtt.  Followed cards recommendations.  Greatly appreciate cardiology consult, she follows with [facility].   - Heparin gtt not restarted at request of patient - Dilt gtt started -Continue po metoprolol tartrate 50mg  BID  #Hypovolemic hyponatremia Patient has a history of some chronic hyponatremia which resolved with holding HCTZ.  Na stable but hyponatremic. -LR at56mL/h -Trend with morning labs - Hold HCTZ  #Hypomagnesia #Hypophosphatemia Mg 1.6 this AM. -Patient on TPN - 2g Mg replenished this AM  #AKI on CKD stage II ( AKI resolved) Patient's baseline creatinine is 0.95. Creatinine elevated 1.61 on admission and has trended down with IV fluids. -Continue LR at58mL/h -Trendrenal function  #Hypothyroidism TSH within normal limits, RN reporting patient is not always keeping down her medications. -Continue Synthroid.    #Bladder retention Patient reports extensive history of difficulty urinating  and urethral dilation procedure in the past.  Patient has been urinating on her own. Will continue to monitor.  If found to be retaining recommend in out cath.  If unable to in and out cath after one attempt , will consult urology for assistance given history of needing to follow with urology.   [name], MD 06/29/2020, 7:16 AM Pager: [phone] After  5pm on weekdays and 1pm on weekends: On Call pager [phone]

## 2020-06-29 NOTE — Progress Notes (Signed)
Abdominal x-ray showed ng tube advanced too far.  NG tube retracted to compensate.  Pt tolerated well.  Abdominal x-ray ordered.

## 2020-06-29 NOTE — Progress Notes (Signed)
Called on call IMTS asked if pt could have zofran earlier as pt was constantly nauseated and vomiting with even sip of water, was unable to take pm meds. Per MD could get dose early. Pt requested if it could be every 6 hrs instead of 8, will look into it per MD. Will continue to monitor.

## 2020-06-30 ENCOUNTER — Encounter (HOSPITAL_COMMUNITY): Payer: Self-pay | Admitting: Internal Medicine

## 2020-06-30 ENCOUNTER — Inpatient Hospital Stay: Payer: Self-pay

## 2020-06-30 ENCOUNTER — Inpatient Hospital Stay (HOSPITAL_COMMUNITY): Payer: Medicare HMO

## 2020-06-30 DIAGNOSIS — R140 Abdominal distension (gaseous): Secondary | ICD-10-CM

## 2020-06-30 LAB — GLUCOSE, CAPILLARY
Glucose-Capillary: 154 mg/dL — ABNORMAL HIGH (ref 70–99)
Glucose-Capillary: 159 mg/dL — ABNORMAL HIGH (ref 70–99)
Glucose-Capillary: 180 mg/dL — ABNORMAL HIGH (ref 70–99)
Glucose-Capillary: 200 mg/dL — ABNORMAL HIGH (ref 70–99)
Glucose-Capillary: 203 mg/dL — ABNORMAL HIGH (ref 70–99)
Glucose-Capillary: 204 mg/dL — ABNORMAL HIGH (ref 70–99)

## 2020-06-30 LAB — DIFFERENTIAL
Abs Immature Granulocytes: 0.63 10*3/uL — ABNORMAL HIGH (ref 0.00–0.07)
Basophils Absolute: 0.1 10*3/uL (ref 0.0–0.1)
Basophils Relative: 1 %
Eosinophils Absolute: 0.5 10*3/uL (ref 0.0–0.5)
Eosinophils Relative: 3 %
Immature Granulocytes: 4 %
Lymphocytes Relative: 7 %
Lymphs Abs: 1.2 10*3/uL (ref 0.7–4.0)
Monocytes Absolute: 1.6 10*3/uL — ABNORMAL HIGH (ref 0.1–1.0)
Monocytes Relative: 9 %
Neutro Abs: 14.1 10*3/uL — ABNORMAL HIGH (ref 1.7–7.7)
Neutrophils Relative %: 76 %

## 2020-06-30 LAB — COMPREHENSIVE METABOLIC PANEL
ALT: 21 U/L (ref 0–44)
AST: 25 U/L (ref 15–41)
Albumin: 2.4 g/dL — ABNORMAL LOW (ref 3.5–5.0)
Alkaline Phosphatase: 105 U/L (ref 38–126)
Anion gap: 9 (ref 5–15)
BUN: 14 mg/dL (ref 8–23)
CO2: 25 mmol/L (ref 22–32)
Calcium: 8.4 mg/dL — ABNORMAL LOW (ref 8.9–10.3)
Chloride: 98 mmol/L (ref 98–111)
Creatinine, Ser: 0.8 mg/dL (ref 0.44–1.00)
GFR, Estimated: >60 mL/min (ref >60)
Glucose, Bld: 169 mg/dL — ABNORMAL HIGH (ref 70–99)
Potassium: 3.5 mmol/L (ref 3.5–5.1)
Sodium: 132 mmol/L — ABNORMAL LOW (ref 135–145)
Total Bilirubin: 0.6 mg/dL (ref 0.3–1.2)
Total Protein: 5.3 g/dL — ABNORMAL LOW (ref 6.5–8.1)

## 2020-06-30 LAB — CBC
HCT: 30.1 % — ABNORMAL LOW (ref 36.0–46.0)
Hemoglobin: 10.5 g/dL — ABNORMAL LOW (ref 12.0–15.0)
MCH: 32.2 pg (ref 26.0–34.0)
MCHC: 34.9 g/dL (ref 30.0–36.0)
MCV: 92.3 fL (ref 80.0–100.0)
Platelets: 396 10*3/uL (ref 150–400)
RBC: 3.26 MIL/uL — ABNORMAL LOW (ref 3.87–5.11)
RDW: 12.6 % (ref 11.5–15.5)
WBC: 18.1 10*3/uL — ABNORMAL HIGH (ref 4.0–10.5)
nRBC: 0 % (ref 0.0–0.2)

## 2020-06-30 LAB — TRIGLYCERIDES: Triglycerides: 103 mg/dL (ref <150)

## 2020-06-30 LAB — PREALBUMIN: Prealbumin: 11.1 mg/dL — ABNORMAL LOW (ref 18–38)

## 2020-06-30 LAB — PHOSPHORUS: Phosphorus: 2.9 mg/dL (ref 2.5–4.6)

## 2020-06-30 LAB — MAGNESIUM: Magnesium: 1.9 mg/dL (ref 1.7–2.4)

## 2020-06-30 MED ORDER — FENTANYL CITRATE (PF) 100 MCG/2ML IJ SOLN
25.0000 ug | INTRAMUSCULAR | Status: DC | PRN
  Administered 2020-06-30 – 2020-07-02 (×7): 25 ug via INTRAVENOUS
  Filled 2020-06-30 (×7): qty 2

## 2020-06-30 MED ORDER — MUPIROCIN 2 % EX OINT
1.0000 "application " | TOPICAL_OINTMENT | Freq: Two times a day (BID) | CUTANEOUS | Status: DC
  Administered 2020-07-01 – 2020-07-03 (×5): 1 via NASAL
  Filled 2020-06-30 (×2): qty 22

## 2020-06-30 MED ORDER — DIPHENHYDRAMINE HCL 50 MG/ML IJ SOLN
50.0000 mg | Freq: Once | INTRAMUSCULAR | Status: AC
  Administered 2020-06-30: 50 mg via INTRAVENOUS
  Filled 2020-06-30: qty 1

## 2020-06-30 MED ORDER — IOHEXOL 300 MG/ML  SOLN
100.0000 mL | Freq: Once | INTRAMUSCULAR | Status: AC | PRN
  Administered 2020-06-30: 100 mL via INTRAVENOUS

## 2020-06-30 MED ORDER — METOPROLOL TARTRATE 50 MG PO TABS
50.0000 mg | ORAL_TABLET | Freq: Two times a day (BID) | ORAL | Status: DC
  Administered 2020-06-30 – 2020-07-01 (×3): 50 mg
  Filled 2020-06-30 (×2): qty 1

## 2020-06-30 MED ORDER — TRAVASOL 10 % IV SOLN
INTRAVENOUS | Status: DC
  Filled 2020-06-30: qty 864

## 2020-06-30 MED ORDER — GUAIFENESIN ER 600 MG PO TB12
600.0000 mg | ORAL_TABLET | Freq: Two times a day (BID) | ORAL | Status: DC | PRN
  Administered 2020-06-30: 600 mg via ORAL
  Filled 2020-06-30: qty 1

## 2020-06-30 MED ORDER — HYDROCORTISONE NA SUCCINATE PF 250 MG IJ SOLR
200.0000 mg | Freq: Once | INTRAMUSCULAR | Status: AC
  Administered 2020-06-30: 200 mg via INTRAVENOUS
  Filled 2020-06-30: qty 200

## 2020-06-30 MED ORDER — MAGNESIUM SULFATE 2 GM/50ML IV SOLN
2.0000 g | Freq: Once | INTRAVENOUS | Status: AC
  Administered 2020-06-30: 2 g via INTRAVENOUS
  Filled 2020-06-30: qty 50

## 2020-06-30 MED ORDER — DIPHENHYDRAMINE HCL 25 MG PO CAPS: 50.0000 mg | ORAL_CAPSULE | Freq: Once | ORAL | Status: AC

## 2020-06-30 NOTE — Care Management Important Message (Signed)
Important Message  Patient Details  Name: Jeanette Mcintosh MRN: [id number] Date of Birth: 08-11-1941   Medicare Important Message Given:  Yes     Shelda Altes 06/30/2020, 11:31 AM

## 2020-06-30 NOTE — Progress Notes (Signed)
PHARMACY - TOTAL PARENTERAL NUTRITION CONSULT NOTE   Indication: Acute sigmoid diverticulitis w/ colonic perforation and abscess   Patient Measurements: Height: 5\' 2"  (157.5 cm) Weight: 58.6 kg (129 lb 3 oz) IBW/kg (Calculated) : 50.1 TPN AdjBW (KG): 57.6 Body mass index is 23.63 kg/m. Usual Weight: 66kg but started walking more and has been ~57kg since Aug 2021  Assessment:  79 yo W with colonic perforation pending surgery with >1 week decreased/poor PO intake. IR unable to place drain. Per patient, usual weight is 66kg (last documented in March 2021) but she has been walking more with the same PO intake and weight has dropped to ~57kg since at least August. Patient states she does not eat much baseline, does some cooking, sometimes a small [employer]. Has not had anything by mouth except for some apple sauce since 10/7.   Glucose / Insulin: A1C 6.5 on insulin glargine PTA. CBGs controlled off TPN, now 137-180 exc one outlier 231. Utilized 19 units mSSI in last 24hrs + 10 units reg insulin in TPN Electrolytes: Na 132 stable, Cl nml, Mg 1.9 - 2g x1 today, Phos 2.9, others WNL Renal: AKI resolved - SCr down to 0.8, BUN WNL LFTs / TGs: LFTs / Tbili / TG WNL Prealbumin / albumin: Prealbumin up 11.1; albumin 2.4 Intake / Output; MIVF: UOP 0.7 ml/kg/hr (incomplete documentation); LBM 10/17; MIVF: LR at 75 ml/hr; NGT placed 10/17 and on LIS GI Imaging: 10/14 CT pelvis: 4.1cm loculated fluid collection, no window for drainage  10/13 CT abd: 6x 5.2x4.5 cm fluid and air collection in sigmoid colon, likely perforated diverticulitis w/ abscess; likely reactive ileus 10/17 abd XR: diffusely gas distended loops of SB consistent with ileus or obstruction and similar to prior CT  Surgeries / Procedures: 10/14 failed IR drain placement   Central access: PICC 10/15 TPN start date: 10/15   Nutritional Goals (per RD recommendation 10/15): kCal: 1700-1900, Protein: 85-100g, Fluid: >/=1.7 L/day Goal  TPN rate is 75 mL/hr (provides 86 g of protein and 1706 kcal per day)  Current Nutrition:  TPN; NPO except for sips with meds CLD d/c'd 10/16 with N/V  Plan:  Increase TPN to goal of 57mL/hr at 1800. This TPN will provide 86g protein, 252g CHO, and 50g SMOF lipids, for total 1706 kCal - meeting 100% of protein and 100% of kCal needs. Electrolytes in TPN: Continue same today (lytes will increase with rate increase) - 76mEq/L of Na, 20mEq/L of K, 60mEq/L of Ca, 93mEq/L of Mg, and 34mmol/L of Phos. Cont Cl:Ac ratio to max Cl Add standard MVI and trace elements to TPN Continue LR at 75 ml/hr per MD Increase to 15 units regular insulin in TPN bag + continue Moderate q4h SSI and adjust as needed  Monitor TPN labs, f/u Surgery plans   Thank you for involving pharmacy in this patient's care.  Renold Genta, PharmD, BCPS Clinical Pharmacist Clinical phone for 06/30/2020 until 3p is [phone] 06/30/2020 7:35 AM  **Pharmacist phone directory can be found on [email or web address] listed under [place]**

## 2020-06-30 NOTE — Progress Notes (Signed)
Pt complaining of uncontrolled nausea. Pt has Zofran Q8. Frequency not increased due to risk of afib RVR. Pt refusing to take PO metoprolol due to nausea. BP 138/64 (85) HR 74. MD Agyei notified. MD okay with pt refusing medication. Will continue to monitor. Adella Hare, RN

## 2020-06-30 NOTE — Progress Notes (Signed)
Subjective/Chief Complaint: NO BETTER  NAUSEATED WITH THE DILAUDID  One BM pain about the same    Objective: Vital signs in last 24 hours: Temp:  [97.7 F (36.5 C)-98.3 F (36.8 C)] 97.8 F (36.6 C) (10/18 0812) Pulse Rate:  [63-78] 78 (10/18 0332) Resp:  [16-19] 18 (10/18 0812) BP: (130-157)/(64-88) 157/88 (10/18 0332) SpO2:  [95 %-97 %] 96 % (10/18 0332) Last BM Date: 06/29/20  Intake/Output from previous day: 10/17 0701 - 10/18 0700 In: 2634.4 [I.V.:2110.6; IV Piggyback:523.8] Out: 950 [Urine:950] Intake/Output this shift: No intake/output data recorded.   Gen: Alert, NAD, pleasant Card: Reg rate Pulm: Normal rate and efofrt. YTK:PTWSFKCLE but soft. Lower abdominal tenderness greatest in the LLQ. No rigidity or guarding. Hypoactive bowel sounds.  Psych: A&Ox3  Skin: no rashes noted, warm and dry Lab Results:  Recent Labs    06/29/20 0500 06/30/20 0518  WBC 18.3* 18.1*  HGB 10.9* 10.5*  HCT 30.8* 30.1*  PLT 394 396   BMET Recent Labs    06/29/20 0500 06/30/20 0518  NA 131* 132*  K 3.6 3.5  CL 95* 98  CO2 30 25  GLUCOSE 195* 169*  BUN 9 14  CREATININE 0.76 0.80  CALCIUM 8.0* 8.4*   PT/INR No results for input(s): LABPROT, INR in the last 72 hours. ABG No results for input(s): PHART, HCO3 in the last 72 hours.  Invalid input(s): PCO2, PO2  Studies/Results: DG Abd 1 View  Result Date: 06/29/2020 CLINICAL DATA:  NG tube placement EXAM: ABDOMEN - 1 VIEW COMPARISON:  06/29/2020 FINDINGS: NG tube is in the fundus of the stomach. IMPRESSION: NG tube in the stomach. Electronically Signed   By: Rolm Baptise M.D.   On: 06/29/2020 16:24   DG Abd 1 View  Result Date: 06/29/2020 CLINICAL DATA:  Check gastric catheter placement EXAM: ABDOMEN - 1 VIEW COMPARISON:  Film from earlier in the same day. FINDINGS: Scattered dilated loops of small bowel are again identified. Gastric catheter has been placed and extends into the stomach although the tip lies  in the distal esophagus with the catheter looped upon itself. This should be withdrawn and readvanced. IMPRESSION: Gastric catheter looped upon itself with the tip in the distal esophagus. Stable changes of small-bowel obstruction Electronically Signed   By: Inez Catalina M.D.   On: 06/29/2020 15:15   DG Abd Portable 1V  Result Date: 06/29/2020 CLINICAL DATA:  Emesis, nausea EXAM: PORTABLE ABDOMEN - 1 VIEW COMPARISON:  CT abdomen pelvis, 06/25/2020 FINDINGS: Diffusely gas distended loops of small bowel in the central abdomen, measuring up to 4.6 cm. Scattered gas present in the right no obvious free air on supine radiographs. Status post bilateral hip total arthroplasty. IMPRESSION: Diffusely gas distended loops of small bowel in the central abdomen, measuring up to 4.6 cm, consistent with ileus or obstruction and similar to prior CT. No obvious free air in the abdomen on supine radiographs. Electronically Signed   By: Eddie Candle M.D.   On: 06/29/2020 12:15    Anti-infectives: Anti-infectives (From admission, onward)   Start     Dose/Rate Route Frequency Ordered Stop   06/25/20 2200  piperacillin-tazobactam (ZOSYN) IVPB 3.375 g        3.375 g 12.5 mL/hr over 240 Minutes Intravenous Every 8 hours 06/25/20 1439     06/25/20 1445  piperacillin-tazobactam (ZOSYN) IVPB 3.375 g  Status:  Discontinued        3.375 g 100 mL/hr over 30 Minutes Intravenous Every 8 hours 06/25/20  1435 06/25/20 1438   06/25/20 1415  piperacillin-tazobactam (ZOSYN) IVPB 3.375 g        3.375 g 100 mL/hr over 30 Minutes Intravenous  Once 06/25/20 1413 06/25/20 1447      Assessment/Plan: HTN HLD COPD Macular degeneration  PAF -rate improved this AM, A. Fib overnight. Now on Dilt and Lopressor. 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  The patient is no better.  Apparently Dilaudid makes her very nauseated and I will switch this to fentanyl for pain control.  I discussed sigmoid colectomy with colostomy.  I discussed repeating her CT scan to see if a window is available for drainage.  She is reluctant to have surgery until she has a CT scan which I think is reasonable.  Schedule CT scan and then see if she has a drainable fluid collection.  If not, I discussed with her laparotomy with sigmoid colon resection and colostomy.  I reviewed the significant risks given her advanced age of surgery and long-term expectations that the colostomy will be permanent more than likely.  Also discussed risk of injuring the left ureter, right ureter, bladder, bowel, fistula formation, abscess, DVT, cardiovascular event, organ failure and death is all potential complications given her advanced age and severity of her disease.  She understands and realizes that if she does not have a drainable fluid collection that medical management will be futile and her best choice would be surgical resection at that point.  She will be premedicated for CT scan due to her allergies.  Overall, she is stable though medically. - She is distended with n/v. Will get abd xray to confirm she does not need an NGT FEN:NPO, IVF, TPN  JJO:[id number] onheparin gtt(patient wished for it to be stopped. Please see primaries note). Okay w/ chemical prophylaxis from a general surgery standpoint.  ID: Zosyn 10/13>>      LOS: 5 days    Turner Daniels MD 06/30/2020

## 2020-06-30 NOTE — Progress Notes (Addendum)
Subjective: The patient continues to report nausea and feels that the Dilaudid makes her nausea worse. Patient also reports reflux despite having NG tube placed.   Objective:  Vital signs in last 24 hours: Vitals:   06/29/20 1730 06/29/20 2019 06/29/20 2350 06/30/20 0332  BP: 140/73 130/70 138/64 (!) 157/88  Pulse: 66 63 72 78  Resp: 19 16 17 19   Temp: 98.1 F (36.7 C) 98.1 F (36.7 C) 97.7 F (36.5 C) 98.3 F (36.8 C)  TempSrc: Oral Oral Axillary Oral  SpO2: 95% 96% 96% 96%  Weight:      Height:       Physical Exam HENT:     Head: Normocephalic and atraumatic.  Cardiovascular:     Rate and Rhythm: Tachycardia present. Rhythm irregular.  Pulmonary:     Effort: Pulmonary effort is normal.     Breath sounds: Normal breath sounds.  Abdominal:     General: There is distension.     Comments: Hypoactive bowel sounds. Abdomen is taut  Skin:    General: Skin is warm and dry.  Neurological:     Mental Status: She is alert.     Assessment/Plan:  Principal Problem:   Bowel perforation ([place]) Active Problems:   Essential hypertension   Paroxysmal atrial fibrillation (HCC)   Hyponatremia   Atrial fibrillation with RVR (HCC)   Hypothyroidism   Acute kidney injury superimposed on chronic kidney disease ([place])  Jeanette Mcintosh is a 79 year old female living with macular degeneration, CKD stage II, COPD not on inhalers, IDDM, HTN, HLD, diverticulitis and paraoxysmal atrial fibrillation/a flutter who presented for evaluation of abdominal pain and found to have a perforated diverticulitis on CT Abdomen.   #Acute sigmoid diverticulitis with perforation and abscess Patient continues to have abdominal pain and a stable leukocytosis.  Given patient is 79 years old likely would require colostomy and revision, surgery is recommending trial of conservative management.  Patient on Zosyn .   Patient received NG tube confirmed in the stomach. Per surgery recc's patient will be  reevaluated with CT scan before proceeding with surgery. If patient is determined to not have a drainable collection she will likely have to undergo laparotomy with sigmoid resection. -Continue Zosyn  -NPO with sips with meds, TPN -Fentanyl IV PRN, per surgery - Discontinued Dilaudid - Lidocaine patch as needed - F/u CT scan     #Paroxysmal atrial fibrillation Patient is going in and out of A fib. She continues to not want heparin gtt.  Overnight patient refused Metoprolol as she feels she is too nauseous and cannot hold it down. Greatly appreciate cardiology consult, she follows with [facility].   - Heparin gtt not restarted at request of patient - Dilt gtt continued, may need to titrate up -Continue po metoprolol tartrate 50mg  BID   #Hypovolemic hyponatremia Patient has a history of some chronic hyponatremia which resolved with holding HCTZ.  Na stable but hyponatremic. -LR at 75 mL/h -Trend with morning labs - Hold HCTZ   #Hypomagnesia #Hypophosphatemia Mg 1.9 this AM. Phos 2.9. -Patient on TPN - 2g Mg replenished this AM   #AKI on CKD stage II ( AKI resolved) Patient's baseline creatinine is 0.95.  Creatinine elevated 1.61 on admission and has trended down with IV fluids.  -Continue LR at 75 mL/h -Trend renal function   #Hypothyroidism TSH within normal limits, RN reporting patient is not always keeping down her medications. -Continue Synthroid.     #Bladder retention Patient reports extensive history of  difficulty urinating and urethral dilation procedure in the past.  Patient has been urinating on her own. Will continue to monitor.  If found to be retaining recommend in out cath.  If unable to in and out cath after one attempt , will consult urology for assistance given history of needing to follow with urology.    Prior to Admission Living Arrangement: Home Anticipated Discharge Location: Home vs SNF Barriers to Discharge: Treatment Dispo: Anticipated  discharge in approximately 1-2 day(s).   [name], MD 06/30/2020, 8:10 AM Pager: [phone] After 5pm on weekdays and 1pm on weekends: On Call pager [phone]

## 2020-06-30 NOTE — Progress Notes (Signed)
CT shows abscess with progressive obstruction No window for drain despite increase in size Needs laparotomy Tuesday Marked for ostomy Start TNA

## 2020-07-01 ENCOUNTER — Encounter (HOSPITAL_COMMUNITY)
Admission: EM | Disposition: A | Discharge status: Hospice | Payer: Self-pay | Source: Ambulatory Visit | Attending: Internal Medicine

## 2020-07-01 DIAGNOSIS — K631 Perforation of intestine (nontraumatic): Secondary | ICD-10-CM

## 2020-07-01 DIAGNOSIS — Z7189 Other specified counseling: Secondary | ICD-10-CM

## 2020-07-01 DIAGNOSIS — Z66 Do not resuscitate: Secondary | ICD-10-CM | POA: Diagnosis present

## 2020-07-01 DIAGNOSIS — Z515 Encounter for palliative care: Secondary | ICD-10-CM

## 2020-07-01 DIAGNOSIS — K651 Peritoneal abscess: Secondary | ICD-10-CM | POA: Diagnosis present

## 2020-07-01 LAB — CBC
HCT: 31.4 % — ABNORMAL LOW (ref 36.0–46.0)
Hemoglobin: 10.7 g/dL — ABNORMAL LOW (ref 12.0–15.0)
MCH: 31.5 pg (ref 26.0–34.0)
MCHC: 34.1 g/dL (ref 30.0–36.0)
MCV: 92.4 fL (ref 80.0–100.0)
Platelets: 433 10*3/uL — ABNORMAL HIGH (ref 150–400)
RBC: 3.4 MIL/uL — ABNORMAL LOW (ref 3.87–5.11)
RDW: 12.9 % (ref 11.5–15.5)
WBC: 25.1 10*3/uL — ABNORMAL HIGH (ref 4.0–10.5)
nRBC: 0 % (ref 0.0–0.2)

## 2020-07-01 LAB — GLUCOSE, CAPILLARY
Glucose-Capillary: 155 mg/dL — ABNORMAL HIGH (ref 70–99)
Glucose-Capillary: 197 mg/dL — ABNORMAL HIGH (ref 70–99)
Glucose-Capillary: 167 mg/dL — ABNORMAL HIGH (ref 70–99)
Glucose-Capillary: 124 mg/dL — ABNORMAL HIGH (ref 70–99)
Glucose-Capillary: 152 mg/dL — ABNORMAL HIGH (ref 70–99)

## 2020-07-01 LAB — SURGICAL PCR SCREEN
MRSA, PCR: NEGATIVE
Staphylococcus aureus: NEGATIVE

## 2020-07-01 SURGERY — LAPAROTOMY, EXPLORATORY
Anesthesia: General

## 2020-07-01 MED ORDER — OXYCODONE HCL 5 MG/5ML PO SOLN: 5.0000 mg | ORAL | Status: DC | PRN

## 2020-07-01 MED ORDER — LORAZEPAM 1 MG PO TABS: 2.0000 mg | ORAL_TABLET | ORAL | Status: DC | PRN

## 2020-07-01 MED ORDER — FENTANYL 12 MCG/HR TD PT72
1.0000 | MEDICATED_PATCH | TRANSDERMAL | Status: DC
  Administered 2020-07-01: 1 via TRANSDERMAL
  Filled 2020-07-01: qty 1

## 2020-07-01 MED ORDER — TRAVASOL 10 % IV SOLN
INTRAVENOUS | Status: DC
  Filled 2020-07-01: qty 864

## 2020-07-01 MED ORDER — SUCRALFATE 1 GM/10ML PO SUSP
1.0000 g | Freq: Three times a day (TID) | ORAL | Status: DC
  Administered 2020-07-01 – 2020-07-03 (×5): 1 g via ORAL
  Filled 2020-07-01 (×6): qty 10

## 2020-07-01 NOTE — Progress Notes (Signed)
Subjective: Patient doing well this AM. Her son has flown in from [place] and is in the room. Her son is her 46. Patient reports that she does not want the surgery for her perforated diverticula and abscess. Patient reports that she is ready to die and expresses to focus more on comfort and having decent quality of life. She would like to be discharged on hospice.  Objective:  Vital signs in last 24 hours: Vitals:   07/01/20 0405 07/01/20 0821 07/01/20 1127 07/01/20 1625  BP: (!) 153/80 (!) 156/82 (!) 144/64 (!) 148/87  Pulse: 84 91 79 (!) 110  Resp: 20 16 20    Temp: 98.1 F (36.7 C) 97.6 F (36.4 C) 98.6 F (37 C) 98.8 F (37.1 C)  TempSrc: Oral Oral Oral Oral  SpO2: 98% 94% 97% 94%  Weight:   65.4 kg   Height:       Physical Exam Constitutional:      Appearance: She is not ill-appearing.  HENT:     Head: Normocephalic and atraumatic.  Eyes:     Extraocular Movements: Extraocular movements intact.  Cardiovascular:     Rate and Rhythm: Normal rate. Rhythm irregular.  Neurological:     Mental Status: She is alert and oriented to person, place, and time.  Psychiatric:        Attention and Perception: Attention normal.        Mood and Affect: Mood and affect normal.        Speech: Speech normal.        Thought Content: Thought content normal.        Cognition and Memory: Cognition normal.     Comments: Patient is very clear that she has lived a complete life and that she would rather be comfortable. She reports that she thought about the post-op and felt that she would have slow painful decline and would rather be able to avoid this and have the ability to make the decision on her own.  Son is in the room and reports that his mother appears mentally at her baseline. He reports that she sounds similar to how she has always spoken about her outlook on life and health. He is not surprised by her wishes.     Assessment/Plan:  Principal Problem:   Bowel perforation  (HCC) Active Problems:   Essential hypertension   Paroxysmal atrial fibrillation (HCC)   Hyponatremia   Atrial fibrillation with RVR (HCC)   Hypothyroidism   Acute kidney injury superimposed on chronic kidney disease (HCC)   Abscess of abdominal cavity (HCC)   Goals of care, counseling/discussion   DNR (do not resuscitate)   Advanced care planning/counseling discussion   Palliative care by specialist  Gilmer Mor Nealis a 79 year old femaleliving with macular degeneration, CKD stage II, COPD not on inhalers, IDDM, HTN, HLD,diverticulitis andparaoxysmalatrial fibrillation/a flutter who presented for evaluation of abdominal pain and found to have a perforated diverticulitis on CT Abdomen.  #Acute sigmoid diverticulitis with perforation and abscess Patient has decided that she does not want the procedure and that she would like to focus on comfort and end-of-life care. Patient has requested hospice. Palliative was consulted and have consulted a facility that will take patient with abx and fluids. Patient wishes to stop TPN and NG tube. NG tube removal was trialed and patient was able to keep some fluids down.  -Continue Zosyn  -Discontinue TPN - Discontinued NG tube - Palliative care has been consulted and recc's are as follows:              -  Transition to 89mcg/hr Fentanyl patch             -Oxycodone concentrated solution 5mg  sublingual q2hr for breakthrough pain             -Lorazepam 2mg  sublingual for nausea or anxiety             -Carafate 1gm TID for nausea and reflux  - Shower order #Paroxysmal atrial fibrillation Patient is going in and out of A fib. She continues to not want heparin gtt. At this time patient is willing to continue dilt gtt for her A fib but would like tele discontinued. Patient understands the risk of mortatlity with not continuing tele while continuing the dilt gtt and is ok. - Heparin gttnot restartedat request of patient -Dilt gtt  continued -Continue pometoprolol tartrate 50mg  BID - Tele discontinued  #Hypovolemic hyponatremia Will no longer trend. Patient has a history of some chronic hyponatremia which resolved with holdingHCTZ. -LR at23mL/h -Trend with morning labs - Hold HCTZ  #Hypomagnesia #Hypophosphatemia Will no longer trend.   #AKI on CKD stage II( AKI resolved) Patient's baseline creatinine is 0.95. Creatinine elevated 1.61 on admission and has trended down with IV fluids. -Continue LR at58mL/h   #Hypothyroidism TSH within normal limits,RN reporting patient is not always keeping down her medications. -Continue Synthroid.  #Bladder retention Patient reports extensive history ofdifficulty urinating and urethral dilation procedure in the past. Patient has been urinating on her own. Will continue to monitor. If found to be retaining recommend in out cath. If unable to in and out cath after one attempt, will consult urology for assistance given history of needing to follow with urology.  Prior to Admission Living Arrangement: Home Anticipated Discharge Location:  hospice Barriers to Discharge: Eval for hospice Dispo: Anticipated discharge in approximately 1-2 day(s).   [name], MD 07/01/2020, 4:54 PM Pager: [phone] After 5pm on weekdays and 1pm on weekends: On Call pager [phone]

## 2020-07-01 NOTE — Consult Note (Addendum)
WOC consult requested for ostomy marking prior to surgery.   Pt is refusing surgery at this time and is discussing plan of care with Dr Brantley Stage in the room. She is requesting discharge home with comfort care measures.  No further role for [place].  Please re-consult if further assistance is needed.  Thank-you,  Julien Girt MSN, [place], [place], [place], [place]

## 2020-07-01 NOTE — Consult Note (Signed)
Consultation Note Date: 07/01/2020   Patient Name: Jeanette Mcintosh  DOB: [birth year]  MRN: [id number]  Age / Sex: 79 y.o., female  PCP: Jeanette Han, MD Referring Physician: Oda Kilts, MD  Reason for Consultation: Establishing goals of care  HPI/Patient Profile: 79 y.o. female  with past medical history of chronic diverticulitis, macular degeneration, CKD II, COPD, DM, a-fib, HTN, HLD,  admitted on 06/25/2020 with abdominal pain. Workup revealed perforated diverticulitis with abdominal abscess, now with small bowel obstruction. Abscess was not in a location where it could be drained by IR. Conservative therapy with NG tube, bowel rest and antibiotics was attempted, however, abscess increased in size and bowel obstruction progressed on followup CT scan, patient has not clinically improved with worsening pain. Admission has also been complicated with a-fib with intermittent RVR. Surgery was recommended, however, patient declined. Palliative consulted for assistance with goals of care.    Clinical Assessment and Goals of Care:  I have reviewed medical records including EPIC notes, labs and imaging, received report from Dr. Rebeca Mcintosh, examined the patient and met at bedside with Dr. Michaelene Mcintosh, and her son Jeanette Mcintosh  to discuss diagnosis prognosis, [place], EOL wishes, disposition and options.  We discussed a brief life review of the patient. 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 Has six children. From home where she lives with her Jeanette Mcintosh.    As far as functional and nutritional status- prior to admission she was independent. She does feel that she was on a downward decline. She had frequent down days with her diverticulitis causing her pain.    We discussed her current illness and what it means in the larger context of her on-going co-morbidities.  Natural disease trajectory  and expectations at EOL were discussed. Jeanette Mcintosh is very clear that she wants her EOL to be clear and controlled. She wants to spend time saying goodbye and visiting with friends and family. She wants her pain to be controlled with pain medication. She wants to be at home. She does not want a big incision in her gut and does not want a colostomy.   The difference between aggressive medical intervention and comfort care was considered in light of the patient's goals of care.   Advanced directives, concepts specific to code status, artifical feeding and hydration, and rehospitalization were considered and discussed.  Jeanette Mcintosh is currently DNR status. She does not wish to continue TPN. She does wish to continue IV fluids and IV antibiotics until her family can arrive from [place] which appears to be approximately one week.   Hospice services were explained and offered.  Questions and concerns were addressed.   Primary Decision Maker PATIENT    SUMMARY OF RECOMMENDATIONS -I have contacted [facility] liaison to inquire if they will accept patient with IV fluids and IV antibiotics with goal of stopping them once patient arrives- await return call  -Symptom management-   -Transition to 10mg/hr Fentanyl patch  -Oxycodone concentrated solution 519msublingual q2hr for breakthrough  pain  -Lorazepam 19m sublingual for nausea or anxiety  -Carafate 1gm TID for nausea and reflux   Code Status/Advance Care Planning:  DNR  Prognosis:    < 4 weeks due to perforated sigmoid colon with abscess- not eating, plan to transition to full comfort only once family arrives- no surgery  Discharge Planning: Home with Hospice  Primary Diagnoses: Present on Admission: . Bowel perforation ([facility] . Paroxysmal atrial fibrillation (HCC) . Hyponatremia . Essential hypertension . Hypothyroidism . Acute kidney injury superimposed on chronic kidney disease ([facility]   I have reviewed the medical record,  interviewed the patient and family, and examined the patient. The following aspects are pertinent.  Past Medical History:  Diagnosis Date  . Acute kidney injury superimposed on chronic kidney disease ([facility] 11/09/2016  . Arthritis   . Asthma   . Blood transfusion without reported diagnosis   . Cancer (HCC)    Cervical Cancer   . Cataract   . COPD (chronic obstructive pulmonary disease) ([facility]   . Eczema   . GERD (gastroesophageal reflux disease)   . Hyperlipidemia   . Hypertension   . IDDM (insulin dependent diabetes mellitus)   . Irritable bowel syndrome   . Macular degeneration   . PAF (paroxysmal atrial fibrillation) ([facility] 2007  . Paroxysmal atrial fibrillation ([facility] 11/09/2016  . Paroxysmal atrial flutter ([facility]   . Thyroid disease    Social History   Socioeconomic History  . Marital status: Divorced    Spouse name: Not on file  . Number of children: 6  . Years of education: Not on file  . Highest education level: Not on file  Occupational History  . Occupation: retired    Comment: retired pEngineer, water Tobacco Use  . Smoking status: Former Smoker    Packs/day: 0.25    Years: 25.00    Pack years: 6.25    Types: Cigarettes    Start date: 09/14/1950    Quit date: 09/13/2013    Years since quitting: 6.8  . Smokeless tobacco: Never Used  . Tobacco comment: Pt has stopped and started smoking muliple times over the years.  Amount varied during the years.  Vaping Use  . Vaping Use: Never used  Substance and Sexual Activity  . Alcohol use: Yes    Alcohol/week: 0.0 standard drinks    Comment: 1/Evening  . Drug use: No  . Sexual activity: Not on file  Other Topics Concern  . Not on file  Social History Narrative   [place] alone.   [place]   Retired pEngineer, water   SScientist, physiologicalStrain: LHavana  . Difficulty of Paying Living Expenses: Not hard at all  Food Insecurity: No Food Insecurity  . Worried About RShip brokerin the Last Year: Never true  . Ran Out of Food in the Last Year: Never true  Transportation Needs: No Transportation Needs  . Lack of Transportation (Medical): No  . Lack of Transportation (Non-Medical): No  Physical Activity:   . Days of Exercise per Week: Not on file  . Minutes of Exercise per Session: Not on file  Stress: No Stress Concern Present  . Feeling of Stress : Only a little  Social Connections: Unknown  . Frequency of Communication with Friends and Family: More than three times a week  . Frequency of Social Gatherings with Friends and Family: Not on file  . Attends Religious Services: Not on file  . Active Member  of Clubs or Organizations: Not on file  . Attends Archivist Meetings: Not on file  . Marital Status: Divorced   Scheduled Meds: . Chlorhexidine Gluconate Cloth  6 each Topical Daily  . fentaNYL  1 patch Transdermal Q72H  . insulin aspart  0-15 Units Subcutaneous Q4H  . latanoprost  1 drop Both Eyes QHS  . levothyroxine  112 mcg Oral Daily  . lidocaine  1 patch Transdermal Q24H  . metoprolol tartrate  50 mg Per Tube BID  . mupirocin ointment  1 application Nasal BID  . sucralfate  1 g Oral Q8H   Continuous Infusions: . diltiazem (CARDIZEM) infusion 10 mg/hr (07/01/20 0550)  . lactated ringers 75 mL/hr at 07/01/20 0639  . piperacillin-tazobactam (ZOSYN)  IV 3.375 g (07/01/20 0946)  . TPN ADULT (ION) 75 mL/hr at 06/30/20 1727  . TPN ADULT (ION)     PRN Meds:.calcium carbonate, fentaNYL (SUBLIMAZE) injection, guaiFENesin, ondansetron (ZOFRAN) IV, polyvinyl alcohol, sodium chloride flush Medications Prior to Admission:  Prior to Admission medications   Medication Sig Start Date End Date Taking? Authorizing Provider  aspirin EC 81 MG tablet Take 81 mg by mouth daily.   Yes [provider]  b complex vitamins tablet Take 1 tablet by mouth daily.   Yes [provider]  carboxymethylcellulose (REFRESH PLUS) 0.5 % SOLN Place  1 drop into both eyes 3 (three) times daily as needed (dry eyes).   Yes [provider]  cetirizine (ZYRTEC) 10 MG tablet Take 10 mg by mouth daily.   Yes [provider]  Cholecalciferol (VITAMIN D PO) Take 4,000 Units by mouth daily.    Yes [provider]  clonazePAM (KLONOPIN) 1 MG tablet TAKE 1/2 TO 3/4 TABLET AT NIGHT BEFORE BEDTIME FOR ANXIETY AND SLEEP Patient taking differently: Take 0.5 mg by mouth at bedtime.  10/30/19  Yes Nafziger, Tommi Rumps, NP  FOLIC ACID PO Take 845 mcg by mouth daily.    Yes [provider]  guaiFENesin (MUCINEX) 600 MG 12 hr tablet Take 1,200 mg by mouth 2 (two) times daily.   Yes [provider]  hydrochlorothiazide (HYDRODIURIL) 12.5 MG tablet Take 12.5 mg by mouth daily. 03/05/20  Yes [provider]  insulin glargine (LANTUS) 100 UNIT/ML injection USE 12-20 UNITS DAILY AS DIRECTED Patient taking differently: Inject 8-20 Units into the skin at bedtime. Takes only if blood sugar more than 150. 07/31/19  Yes Nafziger, Tommi Rumps, NP  latanoprost (XALATAN) 0.005 % ophthalmic solution Place 1 drop into both eyes at bedtime.  10/13/12  Yes [provider]  levothyroxine (SYNTHROID) 112 MCG tablet Take 112 mcg by mouth daily. 06/20/20  Yes [provider]  losartan (COZAAR) 100 MG tablet Take 1 tablet by mouth daily.  **DUE FOR YEARLY PHYSICAL** 04/29/20  Yes Nafziger, Tommi Rumps, NP  metoprolol tartrate (LOPRESSOR) 25 MG tablet TAKE 1 AND 1/2 TABLETS(37.5 MG) BY MOUTH TWICE DAILY Patient taking differently: Take 37.5 mg by mouth 2 (two) times daily. TAKE 1 AND 1/2 TABLETS(37.5 MG) BY MOUTH TWICE DAILY 06/02/20  Yes Adrian Prows, MD  nitrofurantoin (MACRODANTIN) 100 MG capsule Take 100 mg by mouth daily. 01/23/20  Yes [provider]  NON FORMULARY Take 11 g by mouth daily. Boswellia   Yes [provider]  ondansetron (ZOFRAN) 4 MG tablet Take 1 tablet (4 mg total) by mouth every 8 (eight) hours as needed  for nausea or vomiting. 02/23/20  Yes Harold Hedge, MD  oxymetazoline (AFRIN) 0.05 % nasal  spray Place 1 spray into both nostrils 2 (two) times daily as needed for congestion.   Yes [provider]  RUTIN PO Take 500 mg by mouth daily.    Yes [provider]  TURMERIC PO Take 1,500 mg by mouth in the morning and at bedtime. With Black Pepper and Boswellia   Yes [provider]  VITAMIN A PO Take 10,000 Units by mouth daily.    Yes [provider]  vitamin C (ASCORBIC ACID) 250 MG tablet Take 250 mg by mouth daily.   Yes [provider]  estrogens, conjugated, (PREMARIN) 0.3 MG tablet Take 0.3 mg by mouth every Monday, Wednesday, and Friday. Take daily for 21 days then do not take for 7 days.    [provider]  glucose blood (ONETOUCH VERIO) test strip Use to test blood glucose three times daily. 05/08/19   Nafziger, Tommi Rumps, NP  Insulin Syringe-Needle U-100 (BD INSULIN SYRINGE ULTRAFINE) 31G X 15/64" 0.3 ML MISC USE TO INJECT 12-20 UNITS OF INSULIN DAILY. 07/31/19   Dorothyann Peng, NP   Allergies  Allergen Reactions  . Morphine And Related Nausea Only  . Phenergan [Promethazine Hcl] Other (See Comments)    Restless leg  . Ambien [Zolpidem] Other (See Comments)    Unknown  . Dicyclomine Other (See Comments)    constipation Other reaction(s): GI Upset (intolerance) constipation  . Diphenhydramine Other (See Comments)    Restless leg Restless leg  . Erythromycin Nausea And Vomiting  . Ivp Dye [Iodinated Diagnostic Agents] Nausea And Vomiting  . Ondansetron Other (See Comments)    heartburn  . Propoxyphene Other (See Comments)    Unknown  . Shellfish Allergy Nausea And Vomiting  . Statins Other (See Comments)    Unknown  . Vitamin E Other (See Comments)    Hot flashes   Review of Systems  Constitutional: Positive for activity change, appetite change and fatigue.  Eyes: Positive for visual disturbance.  Gastrointestinal: Positive  for abdominal distention and abdominal pain.  Psychiatric/Behavioral: Negative for suicidal ideas. The patient is not nervous/anxious.     Physical Exam Vitals and nursing note reviewed.  Pulmonary:     Effort: Pulmonary effort is normal.  Skin:    General: Skin is warm and dry.  Neurological:     Mental Status: She is Mcintosh and oriented to person, place, and time.  Psychiatric:        Mood and Affect: Mood normal.        Behavior: Behavior normal.        Thought Content: Thought content normal.        Judgment: Judgment normal.     Vital Signs: BP (!) 144/64 (BP Location: Left Arm)   Pulse 79   Temp 98.6 F (37 C) (Oral)   Resp 20   Ht 5' 2"  (1.575 m)   Wt 65.4 kg   SpO2 97%   BMI 26.36 kg/m  Pain Scale: 0-10 POSS *See Group Information*: 1-Acceptable,Awake and Mcintosh Pain Score: 0-No pain   SpO2: SpO2: 97 % O2 Device:SpO2: 97 % O2 Flow Rate: .   IO: Intake/output summary:   Intake/Output Summary (Last 24 hours) at 07/01/2020 1533 Last data filed at 07/01/2020 0500 Gross per 24 hour  Intake 2321.67 ml  Output 450 ml  Net 1871.67 ml    LBM: Last BM Date: 06/29/20 Baseline Weight: Weight: 55.8 kg Most recent weight: Weight: 65.4 kg     Palliative Assessment/Data: PPS: 10%  Thank you for this consult. Palliative medicine will continue to follow and assist as needed.   Time Total: 83 mins Greater than 50%  of this time was spent counseling and coordinating care related to the above assessment and plan.  Signed by: Mariana Kaufman, AGNP-C Palliative Medicine    Please contact Palliative Medicine Team phone at [phone] for questions and concerns.  For individual provider: See Shea Evans

## 2020-07-01 NOTE — Progress Notes (Signed)
Subjective/Chief Complaint:  Patient refusing surgery.  She has no desire to have surgery and desires to go home.  She is aware that she will more than likely pass from this choice.  She is adamant against any surgical intervention at this point in time after lengthy discussion.  She is thought this room and would like to go home with comfort care at this point time and have her NG tube removed.  I recommended she speak with palliative care about these issues.  Objective: Vital signs in last 24 hours: Temp:  [97.6 F (36.4 C)-99 F (37.2 C)] 97.6 F (36.4 C) (10/19 0821) Pulse Rate:  [72-145] 91 (10/19 0821) Resp:  [15-20] 16 (10/19 0821) BP: (137-174)/(73-91) 156/82 (10/19 0821) SpO2:  [94 %-98 %] 94 % (10/19 0821) Last BM Date: 06/29/20  Intake/Output from previous day: 10/18 0701 - 10/19 0700 In: 2321.7 [I.V.:2195.5; IV Piggyback:126.2] Out: 2778 [Urine:1550] Intake/Output this shift: No intake/output data recorded.  Awake alert.  NG tube in left nare.  Pleasant and of sound mind.  Lab Results:  Recent Labs    06/30/20 0518 07/01/20 0623  WBC 18.1* 25.1*  HGB 10.5* 10.7*  HCT 30.1* 31.4*  PLT 396 433*   BMET Recent Labs    06/29/20 0500 06/30/20 0518  NA 131* 132*  K 3.6 3.5  CL 95* 98  CO2 30 25  GLUCOSE 195* 169*  BUN 9 14  CREATININE 0.76 0.80  CALCIUM 8.0* 8.4*   PT/INR No results for input(s): LABPROT, INR in the last 72 hours. ABG No results for input(s): PHART, HCO3 in the last 72 hours.  Invalid input(s): PCO2, PO2  Studies/Results: DG Abd 1 View  Result Date: 06/29/2020 CLINICAL DATA:  NG tube placement EXAM: ABDOMEN - 1 VIEW COMPARISON:  06/29/2020 FINDINGS: NG tube is in the fundus of the stomach. IMPRESSION: NG tube in the stomach. Electronically Signed   By: Rolm Baptise M.D.   On: 06/29/2020 16:24   DG Abd 1 View  Result Date: 06/29/2020 CLINICAL DATA:  Check gastric catheter placement EXAM: ABDOMEN - 1 VIEW COMPARISON:  Film  from earlier in the same day. FINDINGS: Scattered dilated loops of small bowel are again identified. Gastric catheter has been placed and extends into the stomach although the tip lies in the distal esophagus with the catheter looped upon itself. This should be withdrawn and readvanced. IMPRESSION: Gastric catheter looped upon itself with the tip in the distal esophagus. Stable changes of small-bowel obstruction Electronically Signed   By: Inez Catalina M.D.   On: 06/29/2020 15:15   CT ABDOMEN PELVIS W CONTRAST  Result Date: 06/30/2020 CLINICAL DATA:  Follow-up pelvic abscess. EXAM: CT ABDOMEN AND PELVIS WITH CONTRAST TECHNIQUE: Multidetector CT imaging of the abdomen and pelvis was performed using the standard protocol following bolus administration of intravenous contrast. CONTRAST:  171mL OMNIPAQUE IOHEXOL 300 MG/ML  SOLN COMPARISON:  CT scans 10/13 and 06/26/2020 FINDINGS: Lower chest: New bilateral pleural effusions with overlying atelectasis. Stable moderate-sized hiatal hernia. The NG tube is coiled back on itself with its tip in the hiatal hernia. This should be advanced several cm to be affective. Hepatobiliary: No hepatic lesions or intrahepatic biliary dilatation. The gallbladder is surgically absent. No common bile duct dilatation. Pancreas: No mass, inflammation or ductal dilatation. Spleen: Normal size.  No focal lesions. Adrenals/Urinary Tract: Adrenal glands and kidneys are unremarkable. The bladder is grossly normal. It is largely obscured by artifact from the patient's bilateral hip prostheses. Stomach/Bowel: The  stomach is decompressed by a and NG tube. The duodenum is unremarkable. Dilated small bowel loops with air-fluid levels throughout consistent with a small-bowel obstruction. This appears slightly progressive. There is a transition to normal/decompressed loops of small bowel in the right lower quadrant. The small bowel loops surrounding the pelvic abscess are markedly inflamed with  mucosal and serosal enhancement and submucosal edema. This is likely causing a functional obstruction. Severe sigmoid colon diverticulosis is again demonstrated and there is a large rim enhancing right-sided pelvic abscess which measures 7.5 x 6.0 cm and is slightly larger when compared to the pelvic CT scan of 06/26/2020 (6.0 x 5.2 cm). Vascular/Lymphatic: Stable advanced vascular calcifications but no aneurysm. No abdominal/pelvic adenopathy. Reproductive: Surgically absent. Other: Small amount of free pelvic fluid appears relatively stable. I do not see any definite free air. Musculoskeletal: No significant bony findings. IMPRESSION: 1. Slight interval increase in size of the right-sided pelvic abscess. 2. Progressive small-bowel obstruction. This is likely due to significant inflammatory changes involving the small bowel loops surrounding the pelvic abscess. 3. New bilateral pleural effusions with overlying atelectasis. 4. NG tube coiled back on itself with its tip in the hiatal hernia. This should be advanced several cm to be effective. 5. Severe sigmoid colon diverticulosis. 6. Stable advanced vascular calcifications. These results will be called to the ordering clinician or representative by the Radiologist Assistant, and communication documented in the PACS or [employer]. 1. Aortic atherosclerosis. Aortic Atherosclerosis (ICD10-I70.0). Electronically Signed   By: Marijo Sanes M.D.   On: 06/30/2020 15:57   DG Abd Portable 1V  Result Date: 06/29/2020 CLINICAL DATA:  Emesis, nausea EXAM: PORTABLE ABDOMEN - 1 VIEW COMPARISON:  CT abdomen pelvis, 06/25/2020 FINDINGS: Diffusely gas distended loops of small bowel in the central abdomen, measuring up to 4.6 cm. Scattered gas present in the right no obvious free air on supine radiographs. Status post bilateral hip total arthroplasty. IMPRESSION: Diffusely gas distended loops of small bowel in the central abdomen, measuring up to 4.6 cm, consistent with  ileus or obstruction and similar to prior CT. No obvious free air in the abdomen on supine radiographs. Electronically Signed   By: Eddie Candle M.D.   On: 06/29/2020 12:15   [place] EKG SITE RITE  Result Date: 06/30/2020 If Site Rite image not attached, placement could not be confirmed due to current cardiac rhythm.   Anti-infectives: Anti-infectives (From admission, onward)   Start     Dose/Rate Route Frequency Ordered Stop   06/25/20 2200  piperacillin-tazobactam (ZOSYN) IVPB 3.375 g        3.375 g 12.5 mL/hr over 240 Minutes Intravenous Every 8 hours 06/25/20 1439     06/25/20 1445  piperacillin-tazobactam (ZOSYN) IVPB 3.375 g  Status:  Discontinued        3.375 g 100 mL/hr over 30 Minutes Intravenous Every 8 hours 06/25/20 1435 06/25/20 1438   06/25/20 1415  piperacillin-tazobactam (ZOSYN) IVPB 3.375 g        3.375 g 100 mL/hr over 30 Minutes Intravenous  Once 06/25/20 1413 06/25/20 1447      Assessment/Plan: Sigmoid diverticulitis with abscess  Patient is refusing surgery and her abscess is not in a drainable place.  It has gotten larger.  She is thought this through and is decided against any surgical intervention understanding she will die from this condition without it being treated.  There is a small possibility of antibiotics the abscess will increase in size to a location where a window is obtainable.  I had a long discussion about the above and she is thought this through quite carefully.  She does not wish to undergo the pain and suffering that will happen with this procedure as well as the complication rate of up to 1 and 2 patients with a major complication mortality being anywhere from 8 to 13% based upon calculators.  We will sign off.  Recommend palliative care surgery.  Please call us back if she changes her mind.   LOS: 6 days    Jeanette Mcintosh 07/01/2020

## 2020-07-01 NOTE — Progress Notes (Signed)
PHARMACY - TOTAL PARENTERAL NUTRITION CONSULT NOTE   Indication: Acute sigmoid diverticulitis w/ colonic perforation and abscess, bowel obstruction  Patient Measurements: Height: 5\' 2"  (157.5 cm) Weight: 58.6 kg (129 lb 3 oz) IBW/kg (Calculated) : 50.1 TPN AdjBW (KG): 57.6 Body mass index is 23.63 kg/m. Usual Weight: 66kg but started walking more and has been ~57kg since Aug 2021  Assessment:  79 yo W with colonic perforation pending surgery with >1 week decreased/poor PO intake. IR unable to place drain. Per patient, usual weight is 66kg (last documented in March 2021) but she has been walking more with the same PO intake and weight has dropped to ~57kg since at least August. Patient states she does not eat much baseline, does some cooking, sometimes a small [employer]. Has not had anything by mouth except for some apple sauce since 10/7.   Glucose / Insulin: A1C 6.5 on insulin glargine PTA. CBGs 154-204 (rec'd HC 200 mg 10/18). Utilized 16 units mSSI in last 24hrs + 15 units reg insulin in TPN Electrolytes: Na 132 stable, Cl nml, Mg 1.9 - 2g x1 10/18, Phos 2.9, others WNL Renal: AKI resolved - SCr down to 0.8, BUN WNL LFTs / TGs: LFTs / Tbili / TG WNL Prealbumin / albumin: Prealbumin up 11.1; albumin 2.4 Intake / Output; MIVF: UOP 1.1 ml/kg/hr (incomplete documentation); LBM 10/18; MIVF: LR at 75 ml/hr; NGT placed 10/17 and on LIS GI Imaging:  10/14 CT pelvis: 4.1cm loculated fluid collection, no window for drainage  10/13 CT abd: 6x 5.2x4.5 cm fluid and air collection in sigmoid colon, likely perforated diverticulitis w/ abscess; likely reactive ileus 10/17 abd XR: diffusely gas distended loops of SB consistent with ileus or obstruction and similar to prior CT  10/18 CT abd: incr size right pelvic abscess with progressive SB obstruction Surgeries / Procedures:  10/14 failed IR drain placement  10/19 laparotomy with ostomy pending  Central access: PICC 10/15 TPN start date:  10/15   Nutritional Goals (per RD recommendation 10/15): kCal: 1700-1900, Protein: 85-100g, Fluid: >/=1.7 L/day Goal TPN rate is 75 mL/hr (provides 86 g of protein and 1706 kcal per day)  Current Nutrition:  TPN; NPO except for sips with meds CLD d/c'd 10/16 with N/V  Plan:  Continue TPN at goal rate of 16mL/hr at 1800. This TPN will provide 86g protein, 252g CHO, and 50g SMOF lipids, for total 1706 kCal - meeting 100% of protein and 100% of kCal needs. Electrolytes in TPN: Continue same today - 23mEq/L of Na, 56mEq/L of K, 55mEq/L of Ca, 25mEq/L of Mg, and 43mmol/L of Phos. Cont Cl:Ac ratio to max Cl Add standard MVI and trace elements to TPN Continue LR at 75 ml/hr per MD Continue 15 units regular insulin in TPN bag + continue Moderate q4h SSI and adjust as needed  Monitor TPN labs Mon/Thur, labs in am   Thank you for involving pharmacy in this patient's care.  Renold Genta, PharmD, BCPS Clinical Pharmacist Clinical phone for 07/01/2020 until 3p is [phone] 07/01/2020 7:22 AM  **Pharmacist phone directory can be found on [email or web address] listed under [place]**

## 2020-07-01 NOTE — Progress Notes (Signed)
Additional non face to face-   Spoke with Jeanette Mcintosh from [facility]- after reviewing patient's chart they would be willing to admit patient to home hospice services and continue IV fluids and IV antibiotics at home with the goal being to stop them when family arrives.  Confirmed with patient that she would not want return hospitalization for worsening symptoms. Discussed with patient that progression of SBO symptoms may worsen and she may need admission to hospice facility in [facility] for more advanced symptom control- Jeanette Mcintosh agreed, she stated she was ok with being uncomfortable and managing at home as long as she could be with her friends.   Jeanette Mcintosh, AGNP-C Palliative Medicine  Additional time: 36 mins

## 2020-07-02 DIAGNOSIS — K56609 Unspecified intestinal obstruction, unspecified as to partial versus complete obstruction: Secondary | ICD-10-CM

## 2020-07-02 DIAGNOSIS — I4891 Unspecified atrial fibrillation: Secondary | ICD-10-CM

## 2020-07-02 LAB — GLUCOSE, CAPILLARY
Glucose-Capillary: 138 mg/dL — ABNORMAL HIGH (ref 70–99)
Glucose-Capillary: 143 mg/dL — ABNORMAL HIGH (ref 70–99)
Glucose-Capillary: 151 mg/dL — ABNORMAL HIGH (ref 70–99)
Glucose-Capillary: 152 mg/dL — ABNORMAL HIGH (ref 70–99)

## 2020-07-02 MED ORDER — SODIUM CHLORIDE 0.9 % IV SOLN: Freq: Once | INTRAVENOUS | Status: DC

## 2020-07-02 MED ORDER — SODIUM CHLORIDE 0.9 % IV SOLN
8.0000 mg | Freq: Four times a day (QID) | INTRAVENOUS | Status: DC
  Administered 2020-07-03 (×2): 8 mg via INTRAVENOUS
  Filled 2020-07-02 (×6): qty 4

## 2020-07-02 MED ORDER — PROMETHAZINE HCL 25 MG/ML IJ SOLN
12.5000 mg | Freq: Four times a day (QID) | INTRAMUSCULAR | Status: AC | PRN
  Administered 2020-07-02 (×2): 12.5 mg via INTRAVENOUS
  Filled 2020-07-02 (×2): qty 1

## 2020-07-02 MED ORDER — SODIUM CHLORIDE 0.9 % IV SOLN
16.0000 mg | INTRAVENOUS | Status: AC
  Administered 2020-07-02: 16 mg via INTRAVENOUS
  Filled 2020-07-02: qty 8

## 2020-07-02 MED ORDER — SODIUM CHLORIDE 0.9 % IV SOLN
25.0000 ug/h | INTRAVENOUS | Status: DC
  Administered 2020-07-02 – 2020-07-03 (×2): 25 ug/h via INTRAVENOUS
  Filled 2020-07-02 (×3): qty 1

## 2020-07-02 MED ORDER — ONDANSETRON 4 MG PO TBDP: 16.0000 mg | ORAL_TABLET | Freq: Three times a day (TID) | ORAL | Status: DC | PRN

## 2020-07-02 MED ORDER — LORAZEPAM 2 MG/ML IJ SOLN: 2.0000 mg | INTRAMUSCULAR | Status: DC | PRN

## 2020-07-02 MED ORDER — EMETROL 1.87-1.87-21.5 PO SOLN
10.0000 mL | ORAL | Status: DC | PRN
  Administered 2020-07-03: 10 mL via ORAL
  Filled 2020-07-02: qty 118

## 2020-07-02 MED ORDER — SODIUM CHLORIDE 0.9 % IV SOLN: INTRAVENOUS | Status: DC

## 2020-07-02 MED ORDER — ONDANSETRON HCL 4 MG/2ML IJ SOLN
4.0000 mg | Freq: Four times a day (QID) | INTRAMUSCULAR | Status: DC | PRN
  Administered 2020-07-02: 4 mg via INTRAVENOUS
  Filled 2020-07-02: qty 2

## 2020-07-02 MED ORDER — DEXAMETHASONE SODIUM PHOSPHATE 10 MG/ML IJ SOLN
10.0000 mg | INTRAMUSCULAR | Status: AC
  Administered 2020-07-02: 10 mg via INTRAVENOUS
  Filled 2020-07-02: qty 1

## 2020-07-02 MED ORDER — LORAZEPAM 2 MG/ML IJ SOLN
2.0000 mg | INTRAMUSCULAR | Status: AC
  Administered 2020-07-02: 2 mg via INTRAVENOUS
  Filled 2020-07-02: qty 1

## 2020-07-02 NOTE — Progress Notes (Signed)
Patient wanting Phenergan for Nausea and vomiting

## 2020-07-02 NOTE — Progress Notes (Signed)
Patient c/o nausea does not have anything ordered for it. Not able to tol. Zofran. Text page Dr. Bridgett Larsson on call awaiting return call

## 2020-07-02 NOTE — TOC Initial Note (Addendum)
Transition of Care (TOC) - Initial/Assessment Note  Marvetta Gibbons RN, BSN Transitions of Care Unit 4E- RN Case Manager See Treatment Team for direct phone #    Patient Details  Name: Jeanette Mcintosh MRN: [id number] Date of Birth: Aug 17, 1941  Transition of Care [facility]) CM/SW Contact:    Dawayne Patricia, RN Phone Number: 07/02/2020, 12:16 PM  Clinical Narrative:                 Noted referral from [facility] consult done late yesterday afternoon for home hospice needs. Noted PC had already made call for Home Hospice Referral to [place]. Notified this AM by bedside RN that pt/son had decided that they now wanted to look at residential hospice home instead of returning home with hospice services. Call made to Cheri with [facility]- who confirmed that pt had been approved by Hospice for Home Hospice services and that she had spoken with son this morning and son had told her of their interest now in residential hospice placement- pt will now need to be approved for the residential hospice and see if bed is available. [facility] will f/u this afternoon regarding this. There may not be a bed available in their [facility] facility today per Cheri.  Also spoke with pt at the bedside to confirm this change in plans and her choice for Residential Hospice placement.   1545- per Cheri with [facility]- looking at possible bed available for tomorrow- will need to get morning dose of abx here prior to transporting to Hospice - will check in with Cheri in the AM to confirm bed availability and transition plans.    Expected Discharge Plan: Home w Hospice Care Barriers to Discharge: Hospice Bed not available   Patient Goals and CMS Choice Patient states their goals for this hospitalization and ongoing recovery are:: comfort care- Hospice   Choice offered to / list presented to : Patient, Adult Children  Expected Discharge Plan and Services Expected Discharge Plan:  [place]   Discharge Planning Services: CM Consult Post Acute Care Choice: Hospice Living arrangements for the past 2 months: [place] Agency: [place] Date [place]: 07/02/20 Time [place]: 1125 Representative spoke with at [place]: [place] Arrangements/Services Living arrangements for the past 2 months: [place] with:: Self Patient language and need for interpreter reviewed:: Yes Do you feel safe going back to the place where you live?: No   plan to go to Residential Hospice  Need for Family Participation in Patient Care: Yes (Comment) Care giver support system in place?: Yes (comment)   Criminal Activity/Legal Involvement Pertinent to Current Situation/Hospitalization: No - Comment as needed  Activities of Daily Living Home Assistive Devices/Equipment: None ADL Screening (condition at time of admission) Patient's cognitive ability adequate to safely complete daily activities?: Yes Is the patient deaf or have difficulty hearing?: No Does the patient have difficulty seeing, even when wearing glasses/contacts?: No Does the patient have difficulty concentrating, remembering, or making decisions?: No Patient able to express need for assistance with ADLs?: Yes Does the patient have difficulty dressing or bathing?: No Independently performs ADLs?: Yes (appropriate for developmental age) Does the patient have difficulty walking or climbing stairs?: No Weakness of Legs:  None Weakness of Arms/Hands: None  Permission Sought/Granted Permission sought to share information with : Chartered certified accountant granted to share information with : Yes, Verbal Permission Granted     Permission granted to share info w AGENCY: [facility]        Emotional Assessment Appearance:: Appears stated age Attitude/Demeanor/Rapport: Engaged Affect  (typically observed): Appropriate Orientation: : Oriented to Self, Oriented to Place, Oriented to  Time, Oriented to Situation Alcohol / Substance Use: Not Applicable Psych Involvement: No (comment)  Admission diagnosis:  Diverticulitis [K57.92] Hyponatremia [E87.1] Bowel perforation (HCC) [K63.1] AKI (acute kidney injury) ([place]) [N17.9] Atrial fibrillation with RVR (HCC) [I48.91] Diverticulitis of large intestine with perforation and abscess without bleeding [K57.20] Patient Active Problem List   Diagnosis Date Noted  . Abscess of abdominal cavity ([place])   . Goals of care, counseling/discussion   . DNR (do not resuscitate)   . Advanced care planning/counseling discussion   . Palliative care by specialist   . Bowel perforation ([place]) 06/25/2020  . Diverticulitis 02/22/2020  . Diverticulitis of sigmoid colon 02/21/2020  . Hypothyroidism   . Paroxysmal atrial flutter ([place])   . Macular degeneration   . Hyperlipidemia   . Hypertension associated with diabetes ([place])   . COPD (chronic obstructive pulmonary disease) ([place])   . Atrial fibrillation with RVR ([place])   . Nontraumatic subluxation of extensor tendon at MCP joint of hand, right 04/18/2018  . Sepsis ([place]) 11/09/2016  . Type 2 diabetes mellitus with diabetic nephropathy, with long-term current use of insulin ([place]) 11/09/2016  . Essential hypertension 11/09/2016  . Paroxysmal atrial fibrillation ([place]) 11/09/2016  . Hyponatremia 11/09/2016  . Elevated troponin 11/09/2016  . Influenza 11/09/2016  . Acute kidney injury superimposed on chronic kidney disease ([place]) 11/09/2016  . IRRITABLE BOWEL SYNDROME 06/17/2008  . CHOLELITHIASIS, HX OF 06/17/2008  . DIVERTICULOSIS-COLON 05/31/2008  . GERD 05/06/2008  . DIARRHEA 05/06/2008   PCP:  Buzzy Han, MD Pharmacy:   [facility] [phone] [address], [place] [address] [place] [address] Phone:  [phone] Fax: [phone]     Social Determinants of Health (SDOH) Interventions    Readmission Risk Interventions No flowsheet data found.

## 2020-07-02 NOTE — Progress Notes (Addendum)
Subjective: Patient remains at peace with her decision this AM. She continues to speak of not slowing down the process of death. She continues to voice that comfort is of upmost priority to her. She does report some discomfort in her abdomen and notes that it is distended but she is unable to have flatulence. Patient son is in the room as well. Patient reports that despite another son coming from [place] next Tuesday she feels that she may die in the next 48h.   Objective:  Vital signs in last 24 hours: Vitals:   07/01/20 1827 07/01/20 2013 07/01/20 2356 07/02/20 0346  BP:  (!) 142/73 136/72 (!) 115/54  Pulse:   92 85  Resp:  20 20 20   Temp:  98.3 F (36.8 C) 98.3 F (36.8 C) 98.5 F (36.9 C)  TempSrc:  Oral Oral Axillary  SpO2: 95%  97% 95%  Weight:      Height:       Physical Exam HENT:     Head: Normocephalic and atraumatic.  Eyes:     Extraocular Movements: Extraocular movements intact.     Conjunctiva/sclera: Conjunctivae normal.  Abdominal:     General: There is distension.     Palpations: Abdomen is soft.  Neurological:     General: No focal deficit present.     Mental Status: She is alert and oriented to person, place, and time.  Psychiatric:        Mood and Affect: Mood normal.        Behavior: Behavior normal.     Assessment/Plan:  Principal Problem:   Bowel perforation (HCC) Active Problems:   Essential hypertension   Paroxysmal atrial fibrillation (HCC)   Hyponatremia   Atrial fibrillation with RVR (HCC)   Hypothyroidism   Acute kidney injury superimposed on chronic kidney disease (HCC)   Abscess of abdominal cavity (HCC)   Goals of care, counseling/discussion   DNR (do not resuscitate)   Advanced care planning/counseling discussion   Palliative care by specialist  Gilmer Mor Nealis a 79 year old femaleliving with macular degeneration, CKD stage II, COPD not on inhalers, IDDM, HTN, HLD,diverticulitis andparaoxysmalatrial fibrillation/a  flutter who presented for evaluation of abdominal pain and found to have a perforated diverticulitis on CT Abdomen.  #Acute sigmoid diverticulitis with perforation and abscess Patient has a suction that she likes to help with emesis. Patient is having some discomfort from her distended abdomen. Patient would like LR turned off as she thinks being dehydrated may help decrease some of her emesis. -Continue Zosyn  - Palliative care has been consulted and recc's are as follows:             -Transition to 76mcg/hr Fentanyl patch -Oxycodone concentrated solution 5mg  sublingual q2hr for breakthrough pain -Lorazepam 2mg  sublingual for nausea or anxiety -Carafate 1gm TID for nausea and reflux - considering Ativan liquid for nausea  - Patient has Phenergran PRN - Will hold LR for a few hours and monitor patient response #Paroxysmal atrial fibrillation Patient is comfort care. - Heparin gttnot restartedat request of patient -Dilt gttcontinued will d'c at [facility] -discontinue pometoprolol tartrate 50mg  BID   #Hypovolemic hyponatremia Will no longer trend.   #Hypomagnesia #Hypophosphatemia Will no longer trend.   #AKI on CKD stage II( AKI resolved) No longer trending Cr. - Stop LR for now trial with patient as she hopes being dehydrated may help with her emesis   #Hypothyroidism Discontinue synthroid due to comfort care.  Bladder  Patient has Pure-Wick she is happy  with this.     Prior to Admission Living Arrangement: Home Anticipated Discharge Location:  hospice Barriers to Discharge: Eval for hospice Dispo: Anticipated discharge in approximately 1-2 day(s).   [name], MD 07/02/2020, 6:56 AM Pager: [phone] After 5pm on weekdays and 1pm on weekends: On Call pager [phone]

## 2020-07-02 NOTE — Progress Notes (Signed)
Daily Progress Note   Patient Name: Jeanette Mcintosh       Date: 07/02/2020 DOB: 18-Aug-1941  Age: 79 y.o. MRN#: [id number] Attending Physician: Oda Kilts, MD Primary Care Physician: Buzzy Han, MD Admit Date: 06/25/2020  Reason for Consultation/Follow-up: Establishing goals of care, Non pain symptom management and Pain control  Subjective: Patient in bed, vomiting. Tells me she just wants to be dead. Plan is now for her to go to residential Hospice.  Discussed more aggressive symptom management using octreotide, lorazepam, dexamethasone and ondansetron.She was hesitant for ondansetron because she felt it caused her a-fib- however, she noted that it was only by seeing it on the monitor that she noticed it- benefit of ondansetron helping her nausea vs risk of worsening a-fib discussed and patient agreed to ondansetron.  She declines to have NG tube replaced.    Length of Stay: 7  Current Medications: Scheduled Meds:  . Chlorhexidine Gluconate Cloth  6 each Topical Daily  . fentaNYL  1 patch Transdermal Q72H  . latanoprost  1 drop Both Eyes QHS  . lidocaine  1 patch Transdermal Q24H  . LORazepam  2 mg Intravenous NOW  . mupirocin ointment  1 application Nasal BID  . sucralfate  1 g Oral Q8H    Continuous Infusions: . diltiazem (CARDIZEM) infusion 10 mg/hr (07/02/20 0615)  . octreotide  (SANDOSTATIN)    IV infusion    . ondansetron (ZOFRAN) with dexamethasone (DECADRON) IV    . piperacillin-tazobactam (ZOSYN)  IV 3.375 g (07/02/20 1031)    PRN Meds: anti-nausea, calcium carbonate, fentaNYL (SUBLIMAZE) injection, guaiFENesin, LORazepam, ondansetron (ZOFRAN) IV, ondansetron, oxyCODONE, polyvinyl alcohol, promethazine, sodium chloride flush  Physical Exam           Vital Signs: BP 134/63 (BP Location: Left Arm)   Pulse (!) 103   Temp 99.3 F (37.4 C) (Oral)   Resp 16   Ht 5\' 2"  (1.575 m)   Wt 65.4 kg   SpO2 94%   BMI 26.36 kg/m  SpO2: SpO2: 94 % O2 Device: O2 Device: Room Air O2 Flow Rate: O2 Flow Rate (L/min): 2 L/min  Intake/output summary:   Intake/Output Summary (Last 24 hours) at 07/02/2020 1511 Last data filed at 07/02/2020 0605 Gross per 24 hour  Intake 1070 ml  Output 253 ml  Net 817  ml   LBM: Last BM Date: 07/01/20 Baseline Weight: Weight: 55.8 kg Most recent weight: Weight: 65.4 kg       Palliative Assessment/Data: PPS: 10%     Patient Active Problem List   Diagnosis Date Noted  . Abscess of abdominal cavity ([place])   . Goals of care, counseling/discussion   . DNR (do not resuscitate)   . Advanced care planning/counseling discussion   . Palliative care by specialist   . Bowel perforation ([place]) 06/25/2020  . Diverticulitis 02/22/2020  . Diverticulitis of sigmoid colon 02/21/2020  . Hypothyroidism   . Paroxysmal atrial flutter ([place])   . Macular degeneration   . Hyperlipidemia   . Hypertension associated with diabetes ([place])   . COPD (chronic obstructive pulmonary disease) ([place])   . Atrial fibrillation with RVR ([place])   . Nontraumatic subluxation of extensor tendon at MCP joint of hand, right 04/18/2018  . Sepsis ([place]) 11/09/2016  . Type 2 diabetes mellitus with diabetic nephropathy, with long-term current use of insulin ([place]) 11/09/2016  . Essential hypertension 11/09/2016  . Paroxysmal atrial fibrillation ([place]) 11/09/2016  . Hyponatremia 11/09/2016  . Elevated troponin 11/09/2016  . Influenza 11/09/2016  . Acute kidney injury superimposed on chronic kidney disease ([place]) 11/09/2016  . IRRITABLE BOWEL SYNDROME 06/17/2008  . CHOLELITHIASIS, HX OF 06/17/2008  . DIVERTICULOSIS-COLON 05/31/2008  . GERD 05/06/2008  . DIARRHEA 05/06/2008    Palliative Care Assessment & Plan   Patient Profile: 80 y.o.  female  with past medical history of chronic diverticulitis, macular degeneration, CKD II, COPD, DM, a-fib, HTN, HLD,  admitted on 06/25/2020 with abdominal pain. Workup revealed perforated diverticulitis with abdominal abscess, now with small bowel obstruction. Abscess was not in a location where it could be drained by IR. Conservative therapy with NG tube, bowel rest and antibiotics was attempted, however, abscess increased in size and bowel obstruction progressed on followup CT scan, patient has not clinically improved with worsening pain. Admission has also been complicated with a-fib with intermittent RVR. Surgery was recommended, however, patient declined. Palliative consulted for assistance with goals of care.     Assessment/Recommendations/Plan   Lorazepam IV now and q4hr prn for nausea  Dexamethasone 10mg  IV now  Ondansetron 8mg  IV now and q6hr prn for nausea  Octreotide 42mcg/hr continuous IV infusion until patient is discharged  Continue current pain medicine regimen  Plan for d/c to [facility] hospice house when bed is available  Goals of Care and Additional Recommendations:  Limitations on Scope of Treatment: Full Comfort Care  Code Status:  DNR  Prognosis:   < 2 weeks  Discharge Planning:  Hospice facility  Care plan was discussed with patient, her son, and patient's care team.   Thank you for allowing the Palliative Medicine Team to assist in the care of this patient.   Total time:  37 minutes  Greater than 50%  of this time was spent counseling and coordinating care related to the above assessment and plan.  Mariana Kaufman, AGNP-C Palliative Medicine   Please contact Palliative Medicine Team phone at [phone] for questions and concerns.

## 2020-07-02 NOTE — Progress Notes (Signed)
Nutrition Brief Note  Chart reviewed. Pt now transitioning to comfort care.  No further nutrition interventions warranted at this time.  Please re-consult as needed.   Mariana Single RD, LDN Clinical Nutrition Pager listed in [place]

## 2020-07-03 DIAGNOSIS — K56609 Unspecified intestinal obstruction, unspecified as to partial versus complete obstruction: Secondary | ICD-10-CM

## 2020-07-03 MED ORDER — LORAZEPAM 2 MG/ML IJ SOLN: 2.0000 mg | INTRAMUSCULAR | 0 refills | Status: AC | PRN

## 2020-07-03 MED ORDER — SUCRALFATE 1 GM/10ML PO SUSP: 1.0000 g | Freq: Three times a day (TID) | ORAL | 0 refills | Status: AC

## 2020-07-03 MED ORDER — OXYCODONE HCL 5 MG/5ML PO SOLN: 5.0000 mg | ORAL | 0 refills | Status: AC | PRN

## 2020-07-03 MED ORDER — FENTANYL 12 MCG/HR TD PT72: 1.0000 | MEDICATED_PATCH | TRANSDERMAL | 0 refills | Status: AC

## 2020-07-03 MED ORDER — HEPARIN SOD (PORK) LOCK FLUSH 100 UNIT/ML IV SOLN
250.0000 [IU] | INTRAVENOUS | Status: AC | PRN
  Administered 2020-07-03: 250 [IU]
  Filled 2020-07-03: qty 2.5

## 2020-07-03 MED ORDER — SODIUM CHLORIDE 0.9 % IV SOLN: 25.0000 ug/h | INTRAVENOUS | 0 refills | Status: AC

## 2020-07-03 MED ORDER — PIPERACILLIN-TAZOBACTAM 3.375 G IVPB: 3.3750 g | Freq: Three times a day (TID) | INTRAVENOUS | 0 refills | Status: AC

## 2020-07-03 MED ORDER — FENTANYL CITRATE (PF) 100 MCG/2ML IJ SOLN
25.0000 ug | INTRAMUSCULAR | 0 refills | Status: AC | PRN
Start: 2020-07-03 — End: ?

## 2020-07-03 MED ORDER — LIDOCAINE 5 % EX PTCH: 1.0000 | MEDICATED_PATCH | CUTANEOUS | 0 refills | Status: AC

## 2020-07-03 MED ORDER — PROMETHAZINE HCL 25 MG/ML IJ SOLN: 12.5000 mg | Freq: Four times a day (QID) | INTRAMUSCULAR | 0 refills | Status: AC | PRN

## 2020-07-03 NOTE — Plan of Care (Signed)
Patient is now on comfort care measures and the plan is to be discharged to hospice house. Waiting for bed placement at this time. She has done well with sips of water and ice chips. Meds for nausea and antibiotics given as ordered. Emotionally is very tearful and accepting that she wants to die. Excited to see her sons together again before discharge - other son will be coming from [place] by next week. No apparent distress at this time. Will continue to monitor and continue current POC.

## 2020-07-03 NOTE — TOC Transition Note (Signed)
Transition of Care (TOC) - CM/SW Discharge Note Marvetta Gibbons RN, BSN Transitions of Care Unit 4E- RN Case Manager See Treatment Team for direct phone #    Patient Details  Name: Jeanette Mcintosh MRN: [id number] Date of Birth: 04/06/1941  Transition of [facility]) CM/SW Contact:  Dawayne Patricia, RN Phone Number: 07/03/2020, 12:40 PM   Clinical Narrative:    Notified by [facility] with [facility] that bed is available for Residential Hospice today and pt has been approved. [facility] has met with son here at hospital and completed needed paperwork for admission into Hospice- once pt receives IV abx this am pt can be transported via [facility] to Hospice- goal is to have pt transported around 12 noon.  [facility] called for transport at 1130 and scheduled for a 12 noon pickup. GOLD DNR has been signed- and paper work placed on shadow chart.   RN report # to call Hospice is - [phone]   Final next level of care: [place] Barriers to Discharge: Barriers Resolved   Patient Goals and CMS Choice Patient states their goals for this hospitalization and ongoing recovery are:: comfort care- Hospice   Choice offered to / list presented to : Patient, Adult Children  Discharge Placement                 Residential Hospice Home      Discharge Plan and Services   Discharge Planning Services: CM Consult Post Acute Care Choice: Hospice                      [facility] Agency: [place] Date [place]: 07/02/20 Time [place]: 5027 Representative spoke with at [place]: [place] ([place]) Interventions     Readmission Risk Interventions Readmission Risk Prevention Plan 07/03/2020  PCP or Specialist Appt within 5-7 Days (No Data)  Home Care Screening Complete  Medication Review (RN CM) Complete  Some recent data might be hidden

## 2020-07-03 NOTE — Progress Notes (Signed)
Report given to Lovena Le, RN at [facility], all questions address. Peripheral IV and PICC line to be continued at discharge.

## 2020-07-03 NOTE — Progress Notes (Signed)
Subjective: Patient is doing well this AM. She remains feeling comforted.   Objective:  Vital signs in last 24 hours: Vitals:   07/02/20 1119 07/02/20 2013 07/02/20 2346 07/03/20 0853  BP: 134/63 (!) 142/75 (!) 151/73 (!) 154/74  Pulse: (!) 103 91 (!) 106 (!) 107  Resp: 16 17  18   Temp: 99.3 F (37.4 C) 97.7 F (36.5 C) 97.7 F (36.5 C) 98.8 F (37.1 C)  TempSrc: Oral Oral Oral Oral  SpO2: 94% 91% 97% 92%  Weight:      Height:       Physical Exam HENT:     Head: Normocephalic and atraumatic.  Eyes:     Extraocular Movements: Extraocular movements intact.  Neurological:     General: No focal deficit present.     Mental Status: She is alert.  Psychiatric:        Mood and Affect: Mood normal.        Thought Content: Thought content normal.        Judgment: Judgment normal.     Comments: Patient is at piece with her decision. Patient is happy that he children will all be here. Today she reports that she has things to tell people before she dies and will attempt to that.  Patient did become a bit teary-eyed when talking about her life.     Assessment/Plan:  Principal Problem:   Bowel perforation (HCC) Active Problems:   Essential hypertension   Paroxysmal atrial fibrillation (HCC)   Hyponatremia   Atrial fibrillation with RVR (HCC)   Hypothyroidism   Acute kidney injury superimposed on chronic kidney disease (HCC)   Abscess of abdominal cavity (HCC)   Goals of care, counseling/discussion   DNR (do not resuscitate)   Advanced care planning/counseling discussion   Palliative care by specialist   Small bowel obstruction ([place]) Jeanette Mcintosh a 79 year old femaleliving with macular degeneration, CKD stage II, COPD not on inhalers, IDDM, HTN, HLD,diverticulitis andparaoxysmalatrial fibrillation/a flutter who presented for evaluation of abdominal pain and found to have a perforated diverticulitis on CT Abdomen.  #Acute sigmoid diverticulitis with  perforation and abscess Patient has a suction that she likes to help with emesis. Per Palliative prognosis is <2 weeks. -Continue Zosyn  - Palliative care has been consulted and recc's are as follows: -Transition to 91mcg/hr Fentanyl patch -Oxycodone concentrated solution 5mg  sublingual q2hr for breakthrough pain -Lorazepam 2mg  sublingual for nausea or anxiety -Carafate 1gm TID for nausea and reflux  Dexamethasone 10mg  IV now  Ondansetron 8mg  IV now and q6hr prn for nausea  Octreotide 94mcg/hr continuous IV infusion until patient is discharged - Ativan liquid for nausea  - Patient has Phenergran PRN - Will restart LR at discharge.    #Paroxysmal atrial fibrillation Patient is comfort care. - Heparin gttnot restartedat request of patient -Dilt gttcontinued will d'c at [place] -discontinue pometoprolol tartrate 50mg  BID   #Hypovolemic hyponatremia Will no longer trend.   #Hypomagnesia #Hypophosphatemia Will no longer trend.   #AKI on CKD stage II( AKI resolved) No longer trending Cr. - Stop LR for now trial with patient as she hopes being dehydrated may help with her emesis   #Hypothyroidism Discontinue synthroid due to comfort care.  Bladder  Patient has Pure-Wick she is still happy with this.   Prior to Admission Living Arrangement: Home Anticipated Discharge Location: Hospice center, [facility] Barriers to Discharge: Bed Dispo: Anticipated discharge in approximately 1 day  Jeanette Busman, MD 07/03/2020, 9:32 AM Pager: [phone] After  5pm on weekdays and 1pm on weekends: On Call pager [phone]

## 2020-07-03 NOTE — Progress Notes (Signed)
[facility]: [employer]   Met with pt and son. She is in agreement with Hospice care at our facility. She understands the criteria and that we will be focused on a comfort care approach. The paperwork is completed for our services. She is about to get her morning dose of IV antibiotic and per nurse it will run over 30 minutes. WE would like for her to be at our facility before the afternoon dose is due for continuity of care. WE have medicatin at facility and IVF as well that she will continue. We do not need any scripts for pt to come to [place]. Webb Silversmith RN [phone]

## 2020-07-03 NOTE — Discharge Summary (Addendum)
Name: Jeanette Mcintosh MRN: [id number] DOB: 17-Nov-1940 79 y.o. PCP: Buzzy Han, MD  Date of Admission: 06/25/2020 11:36 AM Date of Discharge: 07/03/2020 Attending Physician: Oda Kilts, MD  Discharge Diagnosis: Principal Problem:   Bowel perforation (HCC) Active Problems:   Essential hypertension   Paroxysmal atrial fibrillation (HCC)   Hyponatremia   Atrial fibrillation with RVR (HCC)   Hypothyroidism   Acute kidney injury superimposed on chronic kidney disease ([place])   Abscess of abdominal cavity (HCC)   Goals of care, counseling/discussion   DNR (do not resuscitate)   Advanced care planning/counseling discussion   Palliative care by specialist   Small bowel obstruction [facility])   Discharge Medications: Allergies as of 07/03/2020       Reactions   Morphine And Related Nausea Only   Phenergan [promethazine Hcl] Other (See Comments)   Restless leg   Ambien [zolpidem] Other (See Comments)   Unknown   Dicyclomine Other (See Comments)   constipation Other reaction(s): GI Upset (intolerance) constipation   Diphenhydramine Other (See Comments)   Restless leg Restless leg   Erythromycin Nausea And Vomiting   Ivp Dye [iodinated Diagnostic Agents] Nausea And Vomiting   Ondansetron Other (See Comments)   heartburn   Propoxyphene Other (See Comments)   Unknown   Shellfish Allergy Nausea And Vomiting   Statins Other (See Comments)   Unknown   Vitamin E Other (See Comments)   Hot flashes        Medication List     STOP taking these medications    aspirin EC 81 MG tablet   b complex vitamins tablet   BD Insulin Syringe Ultrafine 31G X 15/64" 0.3 ML Misc Generic drug: Insulin Syringe-Needle U-100   cetirizine 10 MG tablet Commonly known as: ZYRTEC   clonazePAM 1 MG tablet Commonly known as: KLONOPIN   estrogens (conjugated) 0.3 MG tablet Commonly known as: PREMARIN   FOLIC ACID PO   guaiFENesin 600 MG 12 hr tablet Commonly  known as: MUCINEX   hydrochlorothiazide 12.5 MG tablet Commonly known as: HYDRODIURIL   insulin glargine 100 UNIT/ML injection Commonly known as: Lantus   levothyroxine 112 MCG tablet Commonly known as: SYNTHROID   losartan 100 MG tablet Commonly known as: COZAAR   metoprolol tartrate 25 MG tablet Commonly known as: LOPRESSOR   nitrofurantoin 100 MG capsule Commonly known as: MACRODANTIN   NON FORMULARY   ondansetron 4 MG tablet Commonly known as: Zofran   OneTouch Verio test strip Generic drug: glucose blood   oxymetazoline 0.05 % nasal spray Commonly known as: AFRIN   RUTIN PO   TURMERIC PO   VITAMIN A PO   vitamin C 250 MG tablet Commonly known as: ASCORBIC ACID   VITAMIN D PO       TAKE these medications    carboxymethylcellulose 0.5 % Soln Commonly known as: REFRESH PLUS Place 1 drop into both eyes 3 (three) times daily as needed (dry eyes).   fentaNYL 100 MCG/2ML injection Commonly known as: SUBLIMAZE Inject 0.5 mLs (25 mcg total) into the vein every hour as needed for moderate pain.   fentaNYL 12 MCG/HR Commonly known as: [place] 1 patch onto the skin every 3 (three) days. Start taking on: July 04, 2020   latanoprost 0.005 % ophthalmic solution Commonly known as: XALATAN Place 1 drop into both eyes at bedtime.   lidocaine 5 % Commonly known as: LIDODERM Place 1 patch onto the skin daily. Remove & Discard patch within 12 hours or  as directed by MD   LORazepam 2 MG/ML injection Commonly known as: ATIVAN Inject 1 mL (2 mg total) into the vein every 4 (four) hours as needed (Nausea).   octreotide 500 mcg in sodium chloride 0.9 % 250 mL Inject 25 mcg/hr into the vein continuous.   oxyCODONE 5 MG/5ML solution Commonly known as: ROXICODONE Take 5 mLs (5 mg total) by mouth every 2 (two) hours as needed for breakthrough pain.   piperacillin-tazobactam 3.375 GM/50ML IVPB Commonly known as: ZOSYN Inject 50 mLs (3.375 g total)  into the vein every 8 (eight) hours.   promethazine 25 MG/ML injection Commonly known as: PHENERGAN Inject 0.5 mLs (12.5 mg total) into the vein every 6 (six) hours as needed for nausea or vomiting.   sucralfate 1 GM/10ML suspension Commonly known as: CARAFATE Take 10 mLs (1 g total) by mouth every 8 (eight) hours.        Disposition and follow-up:   Ms.Brookelynn Kynsley Whitehouse was discharged from [facility] in Stable condition.  Patient has decided she will be discharged with hospice. Patient has mental capacity to make this decision. Family is aware and supportive.       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   #Acute sigmoid diverticulitis with perforation and abscess complicated by SBO CT abdomen at admission showed pneumoperitoneum and abscess with dilated bowel loops. General surgery was consulted and recommended conservative treatment due to surgical risks. Patient started on Zosyn and made NPO. IR was consulted to attempt to drain abscess, but were unable due to no appropriate windows. TPN was started. Patient continued to decompensate. Repeat CT noted abscess increasing in size and small bowel obstruction. Surgery decided that operation was the only option left. Patient decided she did not want the surgery and felt that she would rather go home on hospice. Patient son who was also HCPOA arrived and noted that patient was in her right mind and at her baseline. Medical team also felt that patient had capacity to make this decision. Palliative care was consulted and patient decided that she wanted comfort care. TPN was stopped and NG tube (for decompression) removed. Patient was  provided comfort care measures and discharged to hospice center. She is hoping to continue antibiotics and IV fluids to delay the dying process until her family can arrive from out of state and [place].  Paroxysmal atrial fibrillation Patient arrived with A fib w/ RVR and was started on dilt gtt; however it was stopped due to hypotension. Patient returned to NSR. Home metoprolol was also being held for hypotension. On second event patient was given amiodarone and low dose of IV metoprolol, but remained tachycardic. Patient was started on heparin gtt; however patient requested to have no anticoagulation as she was okay with the increased risk of hemorraghic bleed and felt that the heparin caused her more discomfort.  Patient continued to have A fib w/ RVR, Cardiology was consulted. Per recc's weaned off of amiodarone and transititoned to dilt gtt. Patient remained on gtt until discharge.   Hypovolemic hyponatremia Patient was made NPO and provided mIVF. Patient was later started on TPN. Her home medication HCTZ was held during hospitalization. Na improved. Patient was kept on mIVF until the patient determined she wanted  comfort care. Patient will be discharged on fluids.  #AKI on CKD stage II Patient received mIVF and Cr returned to baseline.      Discharge Vitals:   BP (!) 154/74 (BP Location: Left Arm)   Pulse (!) 107   Temp 98.8 F (37.1 C) (Oral)   Resp 18   Ht 5\' 2"  (1.575 m)   Wt 65.4 kg   SpO2 92%   BMI 26.36 kg/m   Pertinent Labs, Studies, and Procedures:  CT ABDOMEN PELVIS WO CONTRAST  Result Date: 06/25/2020 CLINICAL DATA:  Clinical concern for bowel obstruction EXAM: CT ABDOMEN AND PELVIS WITHOUT CONTRAST TECHNIQUE: Multidetector CT imaging of the abdomen and pelvis was performed following the standard protocol without IV contrast. COMPARISON:  02/21/2020 FINDINGS: Lower chest: Lung bases are clear.  Small hiatal hernia. Hepatobiliary: The liver has an unremarkable unenhanced  appearance. No focal liver lesion is seen. Prior cholecystectomy. Pancreas: Unremarkable. Spleen: Unremarkable. Adrenals/Urinary Tract: Unremarkable adrenal glands. Bilateral kidneys appear within normal limits. No renal stone or hydronephrosis. Urinary bladder is largely obscured by extensive metallic streak artifact within the pelvis from adjacent hip arthroplasties. Stomach/Bowel: Extensive colonic diverticulosis. Thickened appearance of the distal sigmoid colon with surrounding inflammatory changes and an adjacent slightly ill-defined fluid and air containing collection measuring approximately 6.0 x 5.2 x 4.5 cm (series 3, image 57; series 6, image 74). There are multiple mildly dilated and fluid-filled loops of small bowel throughout the abdomen. There is a loop of small bowel within the left hemiabdomen which demonstrates prominent luminal narrowing suggesting a site of focal stricture (series 3, images 39-41). Underlying mass not excluded. Vascular/Lymphatic: Aortic atherosclerosis. No enlarged abdominal or pelvic lymph nodes. Reproductive: Status post hysterectomy. No adnexal masses are evident. Other: Small amount of free fluid within the pelvis. Small volume pneumoperitoneum within the non dependent portions of the upper abdomen (for example, series 3, images 11 and 20). Musculoskeletal: Bilateral total hip arthroplasties without apparent complication. Advanced degenerative spondylosis of the visualized thoracolumbar spine. IMPRESSION: 1. Thickened appearance of the distal sigmoid colon with surrounding inflammatory changes and an adjacent fluid and air containing collection measuring approximately 6.0 x 5.2 x 4.5 cm. There is also small volume pneumoperitoneum. Findings compatible with perforated acute sigmoid diverticulitis with adjacent diverticular abscess. Surgical consultation is recommended. 2. Multiple mildly dilated and fluid-filled loops of small bowel throughout the abdomen. Findings favored to  represent reactive ileus. 3. There is a loop of small bowel within the left hemiabdomen which demonstrates prominent short segment luminal narrowing suggesting a site of focal stricture. Underlying mass not excluded. Evaluation is limited in the absence of intravenous and oral contrast. Aortic Atherosclerosis (ICD10-I70.0). Critical Value/emergent results were called by telephone at the time of interpretation on 06/25/2020 at 2:12 pm to provider JOSHUA LONG , who verbally acknowledged these results. Electronically Signed   By: Davina Poke D.O.   On: 06/25/2020 14:13   DG Abd 1 View  Result Date: 06/29/2020 CLINICAL DATA:  NG tube placement EXAM: ABDOMEN - 1 VIEW COMPARISON:  06/29/2020 FINDINGS: NG tube is in the fundus of the stomach. IMPRESSION: NG tube in the stomach. Electronically Signed   By: Rolm Baptise M.D.   On: 06/29/2020 16:24   DG Abd 1 View  Result Date: 06/29/2020 CLINICAL DATA:  Check gastric catheter placement EXAM: ABDOMEN - 1 VIEW COMPARISON:  Film from earlier in the same day. FINDINGS: Scattered dilated loops of small bowel are again identified. Gastric catheter has been placed and extends into  the stomach although the tip lies in the distal esophagus with the catheter looped upon itself. This should be withdrawn and readvanced. IMPRESSION: Gastric catheter looped upon itself with the tip in the distal esophagus. Stable changes of small-bowel obstruction Electronically Signed   By: Inez Catalina M.D.   On: 06/29/2020 15:15   CT ABDOMEN PELVIS W CONTRAST  Result Date: 06/30/2020 CLINICAL DATA:  Follow-up pelvic abscess. EXAM: CT ABDOMEN AND PELVIS WITH CONTRAST TECHNIQUE: Multidetector CT imaging of the abdomen and pelvis was performed using the standard protocol following bolus administration of intravenous contrast. CONTRAST:  169mL OMNIPAQUE IOHEXOL 300 MG/ML  SOLN COMPARISON:  CT scans 10/13 and 06/26/2020 FINDINGS: Lower chest: New bilateral pleural effusions with  overlying atelectasis. Stable moderate-sized hiatal hernia. The NG tube is coiled back on itself with its tip in the hiatal hernia. This should be advanced several cm to be affective. Hepatobiliary: No hepatic lesions or intrahepatic biliary dilatation. The gallbladder is surgically absent. No common bile duct dilatation. Pancreas: No mass, inflammation or ductal dilatation. Spleen: Normal size.  No focal lesions. Adrenals/Urinary Tract: Adrenal glands and kidneys are unremarkable. The bladder is grossly normal. It is largely obscured by artifact from the patient's bilateral hip prostheses. Stomach/Bowel: The stomach is decompressed by a and NG tube. The duodenum is unremarkable. Dilated small bowel loops with air-fluid levels throughout consistent with a small-bowel obstruction. This appears slightly progressive. There is a transition to normal/decompressed loops of small bowel in the right lower quadrant. The small bowel loops surrounding the pelvic abscess are markedly inflamed with mucosal and serosal enhancement and submucosal edema. This is likely causing a functional obstruction. Severe sigmoid colon diverticulosis is again demonstrated and there is a large rim enhancing right-sided pelvic abscess which measures 7.5 x 6.0 cm and is slightly larger when compared to the pelvic CT scan of 06/26/2020 (6.0 x 5.2 cm). Vascular/Lymphatic: Stable advanced vascular calcifications but no aneurysm. No abdominal/pelvic adenopathy. Reproductive: Surgically absent. Other: Small amount of free pelvic fluid appears relatively stable. I do not see any definite free air. Musculoskeletal: No significant bony findings. IMPRESSION: 1. Slight interval increase in size of the right-sided pelvic abscess. 2. Progressive small-bowel obstruction. This is likely due to significant inflammatory changes involving the small bowel loops surrounding the pelvic abscess. 3. New bilateral pleural effusions with overlying atelectasis. 4. NG tube  coiled back on itself with its tip in the hiatal hernia. This should be advanced several cm to be effective. 5. Severe sigmoid colon diverticulosis. 6. Stable advanced vascular calcifications. These results will be called to the ordering clinician or representative by the Radiologist Assistant, and communication documented in the PACS or [employer]. 1. Aortic atherosclerosis. Aortic Atherosclerosis (ICD10-I70.0). Electronically Signed   By: Marijo Sanes M.D.   On: 06/30/2020 15:57   CT PELVIS LIMITED WO CONTRAST  Result Date: 06/26/2020 CLINICAL DATA:  Diverticular abscess.  Drainage requested EXAM: CT PELVIS WITHOUT CONTRAST TECHNIQUE: Multidetector CT imaging of the pelvis was performed following the standard protocol without intravenous contrast. COMPARISON:  the previous day's study FINDINGS: Urinary Tract: Bladder physiologically distended, imaging largely degraded by streak artifact. No hydronephrosis. Bowel: Multiple gas and fluid distended small bowel loops throughout the abdomen, slightly increased since previous. The colon is nondilated. Innumerable distal descending and sigmoid diverticula. 4.1 cm fluid collection in the mesentery in the right pelvis, slightly decreased in size since previous, with adjacent inflammatory/edematous changes. No percutaneous window for drainage from anterior or transgluteal approach. Vascular/Lymphatic: Aortoiliac calcified atheromatous  plaque without aneurysm. No adenopathy localized. Reproductive:  Post hysterectomy.  No adnexal masses identified. Other: No ascites. Minimal extraluminal gas in the left upper quadrant as before (Im9-14,Se2) . Musculoskeletal: Bilateral hip arthroplasty hardware resulting in streak significant streak artifact degrading portions of the scan through the pelvis. Lower lumbar spondylitic changes. No worrisome bone lesion. IMPRESSION: 1. Slight decrease in size of 4.1 cm loculated pelvic fluid collection, with adjacent  inflammatory/edematous changes. NO percutaneous window for drainage from anterior or transgluteal approach. Planned percutaneous drainage was therefore deferred. 2. Slight increase in gas and fluid distended small bowel loops throughout the abdomen, suggesting developing ileus or partial obstruction. 3. Descending and sigmoid diverticulosis. Aortic Atherosclerosis (ICD10-I70.0). Electronically Signed   By: Lucrezia Europe M.D.   On: 06/26/2020 14:54   DG Chest Portable 1 View  Result Date: 06/25/2020 CLINICAL DATA:  Chest pain EXAM: PORTABLE CHEST 1 VIEW COMPARISON:  2018 FINDINGS: The heart size and mediastinal contours are within normal limits. Hyperinflation. No new consolidation or edema. No pleural effusion or pneumothorax. The visualized skeletal structures are unremarkable. IMPRESSION: No acute process in the chest. Electronically Signed   By: Macy Mis M.D.   On: 06/25/2020 12:49   DG Abd Portable 1V  Result Date: 06/29/2020 CLINICAL DATA:  Emesis, nausea EXAM: PORTABLE ABDOMEN - 1 VIEW COMPARISON:  CT abdomen pelvis, 06/25/2020 FINDINGS: Diffusely gas distended loops of small bowel in the central abdomen, measuring up to 4.6 cm. Scattered gas present in the right no obvious free air on supine radiographs. Status post bilateral hip total arthroplasty. IMPRESSION: Diffusely gas distended loops of small bowel in the central abdomen, measuring up to 4.6 cm, consistent with ileus or obstruction and similar to prior CT. No obvious free air in the abdomen on supine radiographs. Electronically Signed   By: Eddie Candle M.D.   On: 06/29/2020 12:15   ECHOCARDIOGRAM COMPLETE  Result Date: 06/26/2020    ECHOCARDIOGRAM REPORT   Patient Name:   TAMAR MIANO Date of Exam: 06/26/2020 Medical Rec #:  [id number]     Height:       62.0 in Accession #:    [id number]    Weight:       127.0 lb Date of Birth:  [birth year]     BSA:          1.576 m Patient Age:    58 years      BP:           112/55 mmHg Patient  Gender: F             HR:           77 bpm. Exam Location:  Inpatient Procedure: 2D Echo, Color Doppler and Cardiac Doppler Indications:    I48.91* Unspecified atrial fibrillation  History:        Patient has prior history of Echocardiogram examinations, most                 recent 11/10/2016. COPD, Arrythmias:Atrial Fibrillation; Risk                 Factors:Hypertension, Diabetes and Dyslipidemia.  Sonographer:    Raquel Sarna Senior RDCS Referring Phys: [id number] [facility] J PATWARDHAN  Sonographer Comments: Technically difficult due to small rib spacing. IMPRESSIONS  1. Left ventricular ejection fraction, by estimation, is 60 to 65%. The left ventricle has normal function. The left ventricle has no regional wall motion abnormalities. Left ventricular diastolic parameters were normal.  2. Right ventricular systolic function  is normal. The right ventricular size is normal. There is mildly elevated pulmonary artery systolic pressure. The estimated right ventricular systolic pressure is 03.0 mmHg.  3. The mitral valve is normal in structure. Trivial mitral valve regurgitation. No evidence of mitral stenosis.  4. The aortic valve is normal in structure. Aortic valve regurgitation is not visualized. No aortic stenosis is present.  5. The inferior vena cava is normal in size with greater than 50% respiratory variability, suggesting right atrial pressure of 3 mmHg. FINDINGS  Left Ventricle: Left ventricular ejection fraction, by estimation, is 60 to 65%. The left ventricle has normal function. The left ventricle has no regional wall motion abnormalities. The left ventricular internal cavity size was normal in size. There is  no left ventricular hypertrophy. Left ventricular diastolic parameters were normal. Right Ventricle: The right ventricular size is normal. No increase in right ventricular wall thickness. Right ventricular systolic function is normal. There is mildly elevated pulmonary artery systolic pressure. The tricuspid  regurgitant velocity is 2.84  m/s, and with an assumed right atrial pressure of 3 mmHg, the estimated right ventricular systolic pressure is 09.2 mmHg. Left Atrium: Left atrial size was normal in size. Right Atrium: Right atrial size was normal in size. Pericardium: There is no evidence of pericardial effusion. Mitral Valve: The mitral valve is normal in structure. Trivial mitral valve regurgitation. No evidence of mitral valve stenosis. Tricuspid Valve: The tricuspid valve is normal in structure. Tricuspid valve regurgitation is mild . No evidence of tricuspid stenosis. Aortic Valve: The aortic valve is normal in structure. Aortic valve regurgitation is not visualized. No aortic stenosis is present. Pulmonic Valve: The pulmonic valve was normal in structure. Pulmonic valve regurgitation is not visualized. No evidence of pulmonic stenosis. Aorta: The aortic root is normal in size and structure. Venous: The inferior vena cava is normal in size with greater than 50% respiratory variability, suggesting right atrial pressure of 3 mmHg. IAS/Shunts: No atrial level shunt detected by color flow Doppler.  LEFT VENTRICLE PLAX 2D LVIDd:         3.71 cm  Diastology LVIDs:         2.12 cm  LV e' medial:    11.40 cm/s LV PW:         0.83 cm  LV E/e' medial:  7.8 LV IVS:        0.78 cm  LV e' lateral:   10.20 cm/s LVOT diam:     2.10 cm  LV E/e' lateral: 8.7 LV SV:         87 LV SV Index:   55 LVOT Area:     3.46 cm  RIGHT VENTRICLE RV S prime:     11.70 cm/s TAPSE (M-mode): 2.3 cm LEFT ATRIUM             Index       RIGHT ATRIUM           Index LA diam:        3.40 cm 2.16 cm/m  RA Area:     11.20 cm LA Vol (A2C):   48.8 ml 30.96 ml/m RA Volume:   22.50 ml  14.28 ml/m LA Vol (A4C):   41.9 ml 26.59 ml/m LA Biplane Vol: 47.4 ml 30.07 ml/m  AORTIC VALVE LVOT Vmax:   96.80 cm/s LVOT Vmean:  64.200 cm/s LVOT VTI:    0.251 m  AORTA Ao Root diam: 3.10 cm MITRAL VALVE  TRICUSPID VALVE MV Area (PHT): 3.37 cm    TR  Peak grad:   32.3 mmHg MV Decel Time: 225 msec    TR Vmax:        284.00 cm/s MV E velocity: 89.20 cm/s MV A velocity: 87.80 cm/s  SHUNTS MV E/A ratio:  1.02        Systemic VTI:  0.25 m                            Systemic Diam: 2.10 cm Ena Dawley MD Electronically signed by Ena Dawley MD Signature Date/Time: 06/26/2020/2:04:59 PM    Final    [place] EKG SITE RITE  Result Date: 06/30/2020 If Site Rite image not attached, placement could not be confirmed due to current cardiac rhythm.  [place] EKG SITE RITE  Result Date: 06/27/2020 If Site Rite image not attached, placement could not be confirmed due to current cardiac rhythm.   Discharge Instructions:  Please continue antibiotics and mIVF as long as patient would like.  Signed: [name], MD 07/03/2020, 11:34 AM   Pager: [phone]

## 2020-08-13 DEATH — deceased

## 2020-11-03 IMAGING — NM NM BONE 3 PHASE
2 series · 12 of 12 positions shown · non-contrast
Comparison: None

CLINICAL DATA: Worsening LEFT hip pain, prior LEFT total hip
replacement in 3494, RIGHT total hip replacement in 7770

EXAM:
NUCLEAR MEDICINE 3-PHASE BONE SCAN
TECHNIQUE: Radionuclide angiographic images, immediate static blood pool
images, and 3-hour delayed static images were obtained of the hips
after intravenous injection of radiopharmaceutical.
RADIOPHARMACEUTICALS:  19.6 mCi 7c-RRm MDP IV

[Series 1: flow · 4.14mm/px · 6 of 40 frames shown (1 of 2)]
[frame 4/40  full-range]
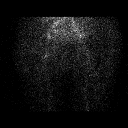
[frame 10/40  full-range]
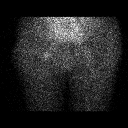
[frame 17/40  full-range]
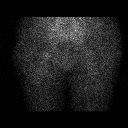
[frame 24/40  full-range]
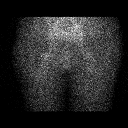
[frame 30/40  full-range]
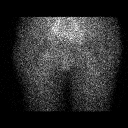
[frame 37/40  full-range]
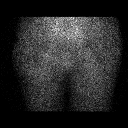

[Series 1: flow · 4.14mm/px · 6 of 40 frames shown (2 of 2)]
[frame 4/40  full-range]
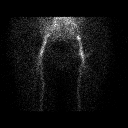
[frame 10/40  full-range]
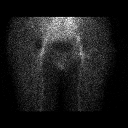
[frame 17/40  full-range]
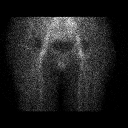
[frame 24/40  full-range]
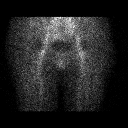
[frame 30/40  full-range]
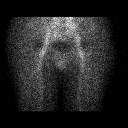
[frame 37/40  full-range]
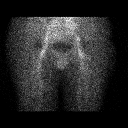

[12 of 12 positions shown; findings below may reference images not displayed]

Radiographic correlation: Pelvic radiograph 07/28/2009, CT abdomen
and pelvis 11/09/2016
FINDINGS: Vascular phase: Normal blood flow to the hips

Blood pool phase: Normal blood pool at the hips

Delayed phase: Photopenic be defects at both hips from hip
prostheses. No significant abnormal osseous tracer accumulation is
seen adjacent to the LEFT hip prosthesis to suggest loosening or
infection. Minimal uptake of tracer is seen at the tip of the
femoral component of the RIGHT hip prosthesis, patient reportedly
not symptomatic on RIGHT side. No abnormal osseous tracer
localization in the pelvis. Tracer retention is noted at the distal
RIGHT ureter which could reflect distal ureteral obstruction or
ureteral, neither which is visualized on the prior CT.
IMPRESSION: No scintigraphic evidence of loosening of the LEFT hip prosthesis.

Subtle tracer accumulation adjacent to the tip of the femoral
component of the RIGHT hip prosthesis, without significant
localization of tracer at the proximal portion, may not be
clinically significant, recommend correlation with patient symptoms.

Prominent tracer in distal RIGHT ureter, cannot exclude distal RIGHT
ureteral dilatation or obstruction.

## 2020-11-13 ENCOUNTER — Ambulatory Visit: Payer: Medicare HMO | Admitting: Cardiology
